# Patient Record
Sex: Male | Born: 1935 | Race: White | Hispanic: No | Marital: Married | Smoking: Never smoker
Health system: Southern US, Community
[De-identification: ages and names within clinical notes are randomized; demographics above are authoritative.]

## PROBLEM LIST (undated history)

## (undated) DIAGNOSIS — E785 Hyperlipidemia, unspecified: Secondary | ICD-10-CM

## (undated) DIAGNOSIS — Z9289 Personal history of other medical treatment: Secondary | ICD-10-CM

## (undated) DIAGNOSIS — H903 Sensorineural hearing loss, bilateral: Secondary | ICD-10-CM

## (undated) DIAGNOSIS — I10 Essential (primary) hypertension: Secondary | ICD-10-CM

## (undated) DIAGNOSIS — I2510 Atherosclerotic heart disease of native coronary artery without angina pectoris: Secondary | ICD-10-CM

## (undated) DIAGNOSIS — F0390 Unspecified dementia without behavioral disturbance: Secondary | ICD-10-CM

## (undated) DIAGNOSIS — H9190 Unspecified hearing loss, unspecified ear: Secondary | ICD-10-CM

## (undated) DIAGNOSIS — I209 Angina pectoris, unspecified: Secondary | ICD-10-CM

## (undated) DIAGNOSIS — M5430 Sciatica, unspecified side: Secondary | ICD-10-CM

## (undated) DIAGNOSIS — I6529 Occlusion and stenosis of unspecified carotid artery: Secondary | ICD-10-CM

## (undated) DIAGNOSIS — I219 Acute myocardial infarction, unspecified: Secondary | ICD-10-CM

## (undated) DIAGNOSIS — G309 Alzheimer's disease, unspecified: Secondary | ICD-10-CM

## (undated) DIAGNOSIS — A6920 Lyme disease, unspecified: Secondary | ICD-10-CM

## (undated) DIAGNOSIS — F0280 Dementia in other diseases classified elsewhere without behavioral disturbance: Secondary | ICD-10-CM

## (undated) DIAGNOSIS — I252 Old myocardial infarction: Secondary | ICD-10-CM

## (undated) DIAGNOSIS — K5792 Diverticulitis of intestine, part unspecified, without perforation or abscess without bleeding: Secondary | ICD-10-CM

## (undated) HISTORY — PX: CORONARY STENT PLACEMENT: SHX1402

## (undated) HISTORY — DX: Lyme disease, unspecified: A69.20

## (undated) HISTORY — DX: Unspecified dementia, unspecified severity, without behavioral disturbance, psychotic disturbance, mood disturbance, and anxiety: F03.90

## (undated) HISTORY — DX: Alzheimer's disease, unspecified: G30.9

## (undated) HISTORY — DX: Diverticulitis of intestine, part unspecified, without perforation or abscess without bleeding: K57.92

## (undated) HISTORY — PX: CHOLECYSTECTOMY: SHX55

## (undated) HISTORY — DX: Old myocardial infarction: I25.2

## (undated) HISTORY — DX: Hyperlipidemia, unspecified: E78.5

## (undated) HISTORY — DX: Personal history of other medical treatment: Z92.89

## (undated) HISTORY — PX: TONSILLECTOMY: SUR1361

## (undated) HISTORY — DX: Unspecified hearing loss, unspecified ear: H91.90

## (undated) HISTORY — PX: HERNIA REPAIR: SHX51

## (undated) HISTORY — DX: Angina pectoris, unspecified: I20.9

## (undated) HISTORY — DX: Dementia in other diseases classified elsewhere, unspecified severity, without behavioral disturbance, psychotic disturbance, mood disturbance, and anxiety: F02.80

## (undated) HISTORY — DX: Occlusion and stenosis of unspecified carotid artery: I65.29

## (undated) HISTORY — PX: APPENDECTOMY: SHX54

---

## 2003-11-25 DIAGNOSIS — I252 Old myocardial infarction: Secondary | ICD-10-CM

## 2003-11-25 HISTORY — DX: Old myocardial infarction: I25.2

## 2014-04-04 ENCOUNTER — Emergency Department (HOSPITAL_COMMUNITY): Payer: Medicare HMO

## 2014-04-04 ENCOUNTER — Encounter (HOSPITAL_COMMUNITY): Payer: Self-pay | Admitting: Emergency Medicine

## 2014-04-04 ENCOUNTER — Emergency Department (HOSPITAL_COMMUNITY)
Admission: EM | Admit: 2014-04-04 | Discharge: 2014-04-04 | Disposition: A | Discharge status: Home / Self Care | Payer: Medicare HMO | Attending: Emergency Medicine | Admitting: Emergency Medicine

## 2014-04-04 DIAGNOSIS — R0789 Other chest pain: Secondary | ICD-10-CM | POA: Insufficient documentation

## 2014-04-04 DIAGNOSIS — Z9861 Coronary angioplasty status: Secondary | ICD-10-CM | POA: Insufficient documentation

## 2014-04-04 DIAGNOSIS — M25519 Pain in unspecified shoulder: Secondary | ICD-10-CM | POA: Insufficient documentation

## 2014-04-04 DIAGNOSIS — Z79899 Other long term (current) drug therapy: Secondary | ICD-10-CM | POA: Insufficient documentation

## 2014-04-04 DIAGNOSIS — I252 Old myocardial infarction: Secondary | ICD-10-CM

## 2014-04-04 DIAGNOSIS — R0602 Shortness of breath: Secondary | ICD-10-CM | POA: Insufficient documentation

## 2014-04-04 HISTORY — DX: Acute myocardial infarction, unspecified: I21.9

## 2014-04-04 LAB — CBC WITH DIFFERENTIAL/PLATELET
BASOS PCT: 0 % (ref 0–1)
Basophils Absolute: 0 10*3/uL (ref 0.0–0.1)
Eosinophils Absolute: 0.1 10*3/uL (ref 0.0–0.7)
Eosinophils Relative: 2 % (ref 0–5)
HCT: 40.4 % (ref 39.0–52.0)
HEMOGLOBIN: 13.8 g/dL (ref 13.0–17.0)
LYMPHS ABS: 1.5 10*3/uL (ref 0.7–4.0)
Lymphocytes Relative: 21 % (ref 12–46)
MCH: 30.7 pg (ref 26.0–34.0)
MCHC: 34.2 g/dL (ref 30.0–36.0)
MCV: 90 fL (ref 78.0–100.0)
MONO ABS: 0.7 10*3/uL (ref 0.1–1.0)
Monocytes Relative: 10 % (ref 3–12)
NEUTROS ABS: 4.9 10*3/uL (ref 1.7–7.7)
NEUTROS PCT: 67 % (ref 43–77)
Platelets: 129 10*3/uL — ABNORMAL LOW (ref 150–400)
RBC: 4.49 MIL/uL (ref 4.22–5.81)
RDW: 13.1 % (ref 11.5–15.5)
WBC: 7.3 10*3/uL (ref 4.0–10.5)

## 2014-04-04 LAB — BASIC METABOLIC PANEL
BUN: 19 mg/dL (ref 6–23)
CO2: 22 mEq/L (ref 19–32)
Calcium: 9.4 mg/dL (ref 8.4–10.5)
Chloride: 103 mEq/L (ref 96–112)
Creatinine, Ser: 0.81 mg/dL (ref 0.50–1.35)
GFR calc Af Amer: >90 mL/min (ref >90)
GFR calc non Af Amer: 79 mL/min — ABNORMAL LOW (ref >90)
Glucose, Bld: 99 mg/dL (ref 70–99)
Potassium: 4.3 mEq/L (ref 3.7–5.3)
Sodium: 137 mEq/L (ref 137–147)

## 2014-04-04 LAB — TROPONIN I: Troponin I: <0.3 ng/mL (ref <0.30)

## 2014-04-04 LAB — PRO B NATRIURETIC PEPTIDE: Pro B Natriuretic peptide (BNP): 218.9 pg/mL (ref 0–450)

## 2014-04-04 NOTE — ED Provider Notes (Signed)
Medical screening examination/treatment/procedure(s) were conducted as a shared visit with resident-physician practitioner(s) and myself.  I personally evaluated the patient during the encounter.  Pt is a 80 y.o. male with pmhx as above presenting with about 15 mins of SOB w/o assoc CP, n/v, diaphoresis, fever, recent illness or injuries.  Pt found to have unremarkable cardiopulm exam and VSS during my exam. No LE pain/edema. Symptoms not simiarl to prior MI. EKG w/ minimal ST depressions seen on prior EKG. Trop not elevated about 6 hrs after episode which is reassuring.  Do not feel sx c/w ACS, though have asked for pt to f/u ASAP for his PCP, discuss outpt stress testing & return to ED for return or new symptoms.    EKG Interpretation  Date/Time:  Tuesday Apr 04 2014 10:40:41 EDT Ventricular Rate:  60 PR Interval:  219 QRS Duration: 97 QT Interval:  444 QTC Calculation: 444 R Axis:   51 Text Interpretation:  Sinus rhythm Ventricular premature complex Borderline prolonged PR interval Minimal ST depression, lateral leads No prior for comparison Confirmed by Jennfer Gassen  MD, Jones Viviani ([id number]) on 04/04/2014 10:48:24 AM        Neta Ehlers, MD 04/04/14 2236

## 2014-04-04 NOTE — ED Provider Notes (Signed)
CSN: [id number]     Arrival date & time 04/04/14  1030 History   First MD Initiated Contact with Patient 04/04/14 1049     Chief Complaint  Patient presents with  . Shortness of Breath     (Consider location/radiation/quality/duration/timing/severity/associated sxs/prior Treatment) HPI  Eldrige Pitkin is a 80 y.o. male PMH MI s/p stent x2 in 2005, recurrent MI 3 years ago that was treated medically, who presents after an episode of SOB.  Patient reports that this morning about 4 hours ago he was lying on his couch taking a nap when he developed sudden onset shortness of breath. He sat up and walked outside to get some fresh air. His symptoms gradually improved over the course of 15-20 minutes. He denies chest pain, shoulder pain, fevers, chills, cough, abdominal pain. He denies a history of leg swelling, weight gain, or PND. Currently he is not experiencing shortness of breath. His wife encouraged him to go to the ED as a precaution.  He tells me his MI's presented as shoulder pain and chest tightness. He did not have any of this this morning.  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 Just yesterday he swam 175 yards without trouble. 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  He recently moved to [place] from [place] because he married a women from here, [place], 3 weeks ago. They're splitting their time between [place] and while they decide where they want to live. He is planning to return to [place] (where his doctors are) at the end of June.  His cardiologist is Dr. Lendon Collar, at [facility] in [place], [place] His PCP is Moise Boring at [facility] in [place], [place] He does not have a local doctor. These practices do not appear to be in [employer].  Past Medical History  Diagnosis Date  . MI (myocardial infarction)    Past Surgical History  Procedure Laterality Date  . Coronary stent  placement     History reviewed. No pertinent family history. History  Substance Use Topics  . Smoking status: Never Smoker   . Smokeless tobacco: Not on file  . Alcohol Use: No    Review of Systems    Allergies  Review of patient's allergies indicates no known allergies.  Home Medications   Prior to Admission medications   Medication Sig Start Date End Date Taking? Authorizing Provider  PRESCRIPTION MEDICATION Take 0.5 tablets by mouth. Heart medication   Yes Historical Provider, MD   BP 150/74  Pulse 55  Temp(Src) 98.5 F (36.9 C) (Oral)  Resp 17  Ht 5\' 8"  (1.727 m)  Wt 157 lb (71.215 kg)  BMI 23.88 kg/m2  SpO2 96% Physical Exam  ED Course  Procedures (including critical care time) Labs Review Labs Reviewed  BASIC METABOLIC PANEL - Abnormal; Notable for the following:    GFR calc non Af Amer 79 (*)    All other components within normal limits  CBC WITH DIFFERENTIAL - Abnormal; Notable for the following:    Platelets 129 (*)    All other components within normal limits  TROPONIN I  PRO B NATRIURETIC PEPTIDE    Imaging Review Dg Chest 2 View  04/04/2014   CLINICAL DATA:  Shortness of breath and weakness  EXAM: CHEST  2 VIEW  COMPARISON:  None.  FINDINGS: The lungs are well-expanded. There is no focal infiltrate. There are coarse lung markings which projects  over the cardiac silhouette on the lateral film and to the left of midline on the frontal film which may reflect subsegmental atelectasis. The cardiopericardial silhouette is top-normal in size. There is tortuosity of the descending thoracic aorta. There is mild degenerative disc space narrowing at multiple thoracic levels.  IMPRESSION: 1. There is no evidence of CHF. 2. Coarse lung markings projecting in the left lower lobe may reflect atelectasis or pneumonia.   Electronically Signed   By: David  Martinique   On: 04/04/2014 12:50     EKG Interpretation   Date/Time:  Tuesday Apr 04 2014 10:40:41 EDT Ventricular  Rate:  60 PR Interval:  219 QRS Duration: 97 QT Interval:  444 QTC Calculation: 444 R Axis:   51 Text Interpretation:  Sinus rhythm Ventricular premature complex  Borderline prolonged PR interval Minimal ST depression, lateral leads No  prior for comparison Confirmed by DOCHERTY  MD, MEGAN ([id number]) on 04/04/2014  10:48:24 AM     Normal sinus rhythm, rate 60, no ST/T wave changes.  MDM   Final diagnoses:  Shortness of breath  History of MI (myocardial infarction)    Haynes Giannotti is a 80 y.o. male PMH MI s/p stent x2 in 2005, recurrent MI 3 years ago that was treated medically, who presents after a transient episode of SOB. It lasted about 15-20 minutes and was not associated with CP. Most important thing to rule out in this patient is ACS. Will obtain troponin, CXR, basic labs, pro BNP.  2:50PM Lab work is unremarkable. Troponin negative. Chest x-ray with no acute abnormality. ProBNP within normal limits. Patient was discharged in stable condition and recommended close PCP and cardiology followup when he returns to [place] next month. He was told to return to the ED or Urgent care if symptoms recur.  Lesly Dukes, MD 04/04/14 1500

## 2014-04-04 NOTE — ED Notes (Addendum)
Called lab to check on blood work results. Lab reports they are running the tests and we should have the results in the next 5-10 minutes.

## 2014-04-04 NOTE — ED Notes (Signed)
Spoke with the lab requesting information on the status of the results of the Troponin. Light green tube was sent down to the lab for BMET and troponin to be run and the BMET has resulted but not the troponin. Lab tech in the lab states that she does not know why the troponin was not run on the 12:00 blood work that was sent down and she reports that more blood work needs to be redrawn because the green top tube in the lab is too old to run the troponin off of. Repeat blood work drawn and sent down for the BNP as well as the troponin. Requested lab run results STAT.

## 2014-04-04 NOTE — ED Notes (Signed)
Pt to department via EMS- pt reports that he went to lay down this morning and became SOB. States that he sat up and took some deep breaths. States that he is now feeling better on EMS arrival. 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 Reports that he recently moved here from [place]. Bp-160/87 Hr-58 20g right hand

## 2014-04-04 NOTE — Discharge Instructions (Signed)
It was a pleasure taking care of you. - Your EKG (heart rhythm strip) and blood work did not suggest heart attack or lung infection. - Please follow up with your PCP and cardiologist in the next few weeks, when you return to [place]. - If you develop recurrent episodes of shortness of breath, chest pain, palpitations please return to the ED or go to our Urgent Care center.

## 2014-11-18 ENCOUNTER — Emergency Department (HOSPITAL_COMMUNITY)
Admission: EM | Admit: 2014-11-18 | Discharge: 2014-11-18 | Disposition: A | Discharge status: Home / Self Care | Payer: Medicare HMO | Attending: Emergency Medicine | Admitting: Emergency Medicine

## 2014-11-18 ENCOUNTER — Emergency Department (HOSPITAL_COMMUNITY): Payer: Medicare HMO

## 2014-11-18 ENCOUNTER — Encounter (HOSPITAL_COMMUNITY): Payer: Self-pay | Admitting: Emergency Medicine

## 2014-11-18 DIAGNOSIS — Z9861 Coronary angioplasty status: Secondary | ICD-10-CM | POA: Insufficient documentation

## 2014-11-18 DIAGNOSIS — Z79899 Other long term (current) drug therapy: Secondary | ICD-10-CM | POA: Diagnosis not present

## 2014-11-18 DIAGNOSIS — M5430 Sciatica, unspecified side: Secondary | ICD-10-CM | POA: Diagnosis present

## 2014-11-18 DIAGNOSIS — I252 Old myocardial infarction: Secondary | ICD-10-CM | POA: Insufficient documentation

## 2014-11-18 DIAGNOSIS — M5441 Lumbago with sciatica, right side: Secondary | ICD-10-CM | POA: Diagnosis not present

## 2014-11-18 DIAGNOSIS — I10 Essential (primary) hypertension: Secondary | ICD-10-CM | POA: Diagnosis not present

## 2014-11-18 DIAGNOSIS — M549 Dorsalgia, unspecified: Secondary | ICD-10-CM

## 2014-11-18 DIAGNOSIS — Z7982 Long term (current) use of aspirin: Secondary | ICD-10-CM | POA: Insufficient documentation

## 2014-11-18 DIAGNOSIS — M79604 Pain in right leg: Secondary | ICD-10-CM

## 2014-11-18 DIAGNOSIS — R109 Unspecified abdominal pain: Secondary | ICD-10-CM | POA: Insufficient documentation

## 2014-11-18 DIAGNOSIS — M5431 Sciatica, right side: Secondary | ICD-10-CM

## 2014-11-18 HISTORY — DX: Essential (primary) hypertension: I10

## 2014-11-18 HISTORY — DX: Sciatica, unspecified side: M54.30

## 2014-11-18 LAB — URINALYSIS, ROUTINE W REFLEX MICROSCOPIC
Bilirubin Urine: NEGATIVE
GLUCOSE, UA: NEGATIVE mg/dL
HGB URINE DIPSTICK: NEGATIVE
Ketones, ur: NEGATIVE mg/dL
LEUKOCYTES UA: NEGATIVE
Nitrite: NEGATIVE
PH: 5.5 (ref 5.0–8.0)
Protein, ur: 30 mg/dL — AB
Specific Gravity, Urine: 1.026 (ref 1.005–1.030)
Urobilinogen, UA: 1 mg/dL (ref 0.0–1.0)

## 2014-11-18 LAB — URINE MICROSCOPIC-ADD ON

## 2014-11-18 MED ORDER — DIAZEPAM 5 MG PO TABS: 5.0000 mg | ORAL_TABLET | Freq: Three times a day (TID) | ORAL | Status: DC | PRN

## 2014-11-18 MED ORDER — PREDNISONE 10 MG PO TABS: ORAL_TABLET | ORAL | Status: DC

## 2014-11-18 MED ORDER — OXYCODONE-ACETAMINOPHEN 5-325 MG PO TABS: 1.0000 | ORAL_TABLET | Freq: Four times a day (QID) | ORAL | Status: DC | PRN

## 2014-11-18 MED ORDER — OXYCODONE-ACETAMINOPHEN 5-325 MG PO TABS
2.0000 | ORAL_TABLET | Freq: Once | ORAL | Status: AC
  Administered 2014-11-18: 2 via ORAL
  Filled 2014-11-18: qty 2

## 2014-11-18 MED ORDER — DIAZEPAM 5 MG PO TABS
5.0000 mg | ORAL_TABLET | Freq: Once | ORAL | Status: AC
  Administered 2014-11-18: 5 mg via ORAL
  Filled 2014-11-18: qty 1

## 2014-11-18 NOTE — ED Provider Notes (Signed)
CSN: [id number]     Arrival date & time 11/18/14  1216 History   First MD Initiated Contact with Patient 11/18/14 1506     Chief Complaint  Patient presents with  . Sciatica     (Consider location/radiation/quality/duration/timing/severity/associated sxs/prior Treatment) The history is provided by the patient, the spouse and medical records. No language interpreter was used.     Isaac [name] is a 80 y.o. male  with a hx of MI, sciatica, HTN presents to the Emergency Department complaining of gradual, persistent, progressively worsening right low back pain onset 1 week ago.  Pt reports the pain is sharp and throbbing, radiating down the right leg, worst from the knee down, rated at a 8/10.  Pt reports seeing his chiropractor without relief on Monday of last week.  Pt reports ibuprofen and xanax which initially made the pain better 2 days ago.  Pt reports the pain returned last light and took more Xanax without relief. Pt reports he has never had pain like this.  Walking or sitting on the right buttock makes the pain worse.  Pt denies fever, chills, headache, neck pain, chest pain, shortness of breath, abdominal pain, nausea, vomiting, diarrhea, weakness, dizziness, syncope, dysuria, hematuria. 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  She reports she has never seen him have pain like this.   Past Medical History  Diagnosis Date  . MI (myocardial infarction)   . Sciatica   . Hypertension    Past Surgical History  Procedure Laterality Date  . Coronary stent placement     No family history on file. History  Substance Use Topics  . Smoking status: Never Smoker   . Smokeless tobacco: Not on file  . Alcohol Use: No    Review of Systems  Constitutional: Negative for fever, diaphoresis, appetite change, fatigue and unexpected weight change.  HENT: Negative for mouth sores.   Eyes: Negative for visual disturbance.   Respiratory: Negative for cough, chest tightness, shortness of breath and wheezing.   Cardiovascular: Negative for chest pain.  Gastrointestinal: Negative for nausea, vomiting, abdominal pain, diarrhea and constipation.  Endocrine: Negative for polydipsia, polyphagia and polyuria.  Genitourinary: Negative for dysuria, urgency, frequency and hematuria.  Musculoskeletal: Positive for back pain and gait problem ( 2/2 pain). Negative for joint swelling, neck pain and neck stiffness.  Skin: Negative for rash.  Allergic/Immunologic: Negative for immunocompromised state.  Neurological: Negative for syncope, weakness, light-headedness, numbness and headaches.  Hematological: Does not bruise/bleed easily.  Psychiatric/Behavioral: Negative for sleep disturbance. The patient is not nervous/anxious.   All other systems reviewed and are negative.     Allergies  Review of patient's allergies indicates no known allergies.  Home Medications   Prior to Admission medications   Medication Sig Start Date End Date Taking? Authorizing Provider  ALPRAZolam (XANAX) 0.25 MG tablet Take 0.25 mg by mouth once.   Yes Historical Provider, MD  aspirin EC 81 MG tablet Take 81 mg by mouth daily.   Yes Historical Provider, MD  B Complex-C (B-COMPLEX WITH VITAMIN C) tablet Take 1 tablet by mouth daily.   Yes Historical Provider, MD  Coenzyme Q10 (CO Q 10 PO) Take 1 tablet by mouth daily.   Yes Historical Provider, MD  donepezil (ARICEPT) 5 MG tablet Take 5 mg by mouth at bedtime.   Yes Historical Provider, MD  famotidine (PEPCID) 20 MG tablet Take 20 mg by mouth daily.   Yes Historical Provider,  MD  Fish Oil-Krill Oil (KRILL OIL PLUS PO) Take 1 capsule by mouth daily.   Yes Historical Provider, MD  glucosamine-chondroitin 500-400 MG tablet Take 1 tablet by mouth daily.   Yes Historical Provider, MD  ibuprofen (ADVIL,MOTRIN) 200 MG tablet Take 400 mg by mouth every 6 (six) hours as needed for moderate pain.   Yes  Historical Provider, MD  lisinopril (PRINIVIL,ZESTRIL) 20 MG tablet Take 20 mg by mouth daily.   Yes Historical Provider, MD  metoprolol succinate (TOPROL-XL) 25 MG 24 hr tablet Take 12.5 mg by mouth daily.   Yes Historical Provider, MD  Multiple Vitamin (MULTIVITAMIN WITH MINERALS) TABS tablet Take 1 tablet by mouth 2 (two) times daily.   Yes Historical Provider, MD  pravastatin (PRAVACHOL) 40 MG tablet Take 40 mg by mouth daily.   Yes Historical Provider, MD  Turmeric 500 MG CAPS Take 500 mg by mouth daily.   Yes Historical Provider, MD  diazepam (VALIUM) 5 MG tablet Take 1 tablet (5 mg total) by mouth every 8 (eight) hours as needed for anxiety (spasms). 11/18/14   Maressa Apollo, PA-C  oxyCODONE-acetaminophen (PERCOCET) 5-325 MG per tablet Take 1-2 tablets by mouth every 6 (six) hours as needed. 11/18/14   Trace Wirick, PA-C  predniSONE (DELTASONE) 10 MG tablet 4 tabs po daily x 3 days, then 3 tabs x 3 days, then 2 tabs x 3 days, then 1 tab x 3 days, then 0.5 tabs x 4 days 11/18/14   Jarrett Soho Adaijah Endres, PA-C   BP 121/79 mmHg  Pulse 84  Temp(Src) 97.6 F (36.4 C) (Oral)  Resp 14  SpO2 94% Physical Exam  Constitutional: He appears well-developed and well-nourished. No distress.  Awake, alert, nontoxic appearance  HENT:  Head: Normocephalic and atraumatic.  Mouth/Throat: Oropharynx is clear and moist. No oropharyngeal exudate.  Eyes: Conjunctivae are normal. No scleral icterus.  Neck: Normal range of motion. Neck supple.  Full ROM without pain  Cardiovascular: Normal rate, regular rhythm, normal heart sounds and intact distal pulses.   Pulmonary/Chest: Effort normal and breath sounds normal. No respiratory distress. He has no wheezes.  Equal chest expansion  Abdominal: Soft. Bowel sounds are normal. He exhibits no distension and no mass. There is tenderness in the suprapubic area. There is no rebound and no guarding.  Very mild suprapubic tenderness  Musculoskeletal:  Normal range of motion. He exhibits no edema.  Full range of motion of the T-spine and L-spine No tenderness to palpation of the spinous processes of the T-spine or L-spine No tenderness to palpation of the paraspinous muscles of the L-spine  Lymphadenopathy:    He has no cervical adenopathy.  Neurological: He is alert. He has normal strength and normal reflexes. No sensory deficit. Coordination and gait normal. GCS eye subscore is 4. GCS verbal subscore is 5. GCS motor subscore is 6.  Reflex Scores:      Bicep reflexes are 2+ on the right side and 2+ on the left side.      Brachioradialis reflexes are 2+ on the right side and 2+ on the left side.      Patellar reflexes are 2+ on the right side and 2+ on the left side.      Achilles reflexes are 2+ on the right side and 2+ on the left side. Speech is clear and goal oriented, follows commands Normal 5/5 strength in upper and lower extremities bilaterally including dorsiflexion and plantar flexion, strong and equal grip strength Sensation normal to light and sharp  touch Moves extremities without ataxia, coordination intact antalgic gait Normal balance No Clonus   Skin: Skin is warm and dry. No rash noted. He is not diaphoretic. No erythema.  Psychiatric: He has a normal mood and affect. His behavior is normal.  Nursing note and vitals reviewed.   ED Course  Procedures (including critical care time) Labs Review Labs Reviewed  URINALYSIS, ROUTINE W REFLEX MICROSCOPIC - Abnormal; Notable for the following:    Protein, ur 30 (*)    All other components within normal limits  URINE MICROSCOPIC-ADD ON    Imaging Review Dg Thoracic Spine 2 View  11/18/2014   CLINICAL DATA:  81 year old male with back pain for 5 days greater on the right. No known injury. Initial encounter.  EXAM: THORACIC SPINE - 2 VIEW  COMPARISON:  Chest radiographs 04/04/2014.  FINDINGS: Normal thoracic segmentation. Bone mineralization is within normal limits for age.  Stable thoracic vertebral height and alignment. Cervicothoracic junction alignment is within normal limits. Lower cervical chronic disc and endplate degeneration. Grossly stable thoracic visceral contours. Posterior ribs largely are overpenetrated on the frontal view.  IMPRESSION: No acute osseous abnormality identified in the thoracic spine.   Electronically Signed   By: Lars Pinks M.D.   On: 11/18/2014 16:17   Dg Lumbar Spine Complete  11/18/2014   CLINICAL DATA:  80 year old male with back pain for 5 days greater on the right. No known injury. Initial encounter.  EXAM: LUMBAR SPINE - COMPLETE 4+ VIEW  COMPARISON:  Thoracic series from the same day reported separately.  FINDINGS: Normal lumbar segmentation. Bone mineralization is within normal limits for age. Chronic appearing anterior endplate changes at Q2-W9, favor degenerative in nature. Multilevel endplate sclerosis and spurring elsewhere. Mild retrolisthesis at L1-L2, L2-L3, and L3-L4. No pars fracture. Mild to moderate lower lumbar facet sclerosis. sacral ala and SI joints within normal limits. Aortoiliac calcified atherosclerosis noted.  IMPRESSION: 1.  No acute osseous abnormality identified in the lumbar spine 2. Multilevel degenerative endplate changes, including anteriorly at L1-L2.   Electronically Signed   By: Lars Pinks M.D.   On: 11/18/2014 16:19     EKG Interpretation None      MDM   Final diagnoses:  Back pain  Sciatica, right  Right leg pain  Right-sided low back pain with right-sided sciatica    Isaac [name] presents with right lower back and right leg pain worse with sitting and walking.  Symptoms consistent with sciatica, but pt also with suprapubic abd tenderness.  Will obtain imaging to r/o compression fractures, UA to r/o UTI and give pain control.  Pt with risk factors for AAA, no pulsatile mass in the abd and pt localizes pain to the leg, doubt AAA or dissection.  Pt with recent travel to and from [place], but no  swelling of the right leg to indicate DVT, neg Homan's sign, no palpable cord or erythema.      4:25PM X-rays evidence of fractures or dislocations. No compression fractures. Urinalysis pending.  5:45 PM Urinalysis evidence of urinary tract infection. Patient reports he continues to have pain. We will attempt a muscle relaxer as well.  I have personally reviewed patient's vitals, nursing note and any pertinent labs or imaging.  I performed an undressed physical exam.    It has been determined that no acute conditions requiring further emergency intervention are present at this time. The patient/guardian have been advised of the diagnosis and plan. I reviewed all labs and imaging including any potential incidental  findings. We have discussed signs and symptoms that warrant return to the ED and they are listed in the discharge instructions.    Vital signs are stable at discharge.   BP 121/79 mmHg  Pulse 84  Temp(Src) 97.6 F (36.4 C) (Oral)  Resp 14  SpO2 94%    Abigail Butts, PA-C 11/19/14 0100  Virgel Manifold, MD 11/20/14 1452

## 2014-11-18 NOTE — ED Notes (Signed)
Pt c/o right sciatica pain that started last weekend. Pt states that he saw his chiropractor on Monday due to the pain.  Pt states that he hasnt been able to sleep due to the pain even with taking pain meds.

## 2014-11-18 NOTE — Discharge Instructions (Signed)
1. Medications: Percocet, Valium, prednisone, usual home medications 2. Treatment: rest, drink plenty of fluids, gentle stretching as discussed, alternate ice and heat 3. Follow Up: Please followup with your primary doctor in 3 days for discussion of your diagnoses and further evaluation after today's visit; if you do not have a primary care doctor use the resource guide provided to find one;  Return to the ER for worsening back pain, difficulty walking, loss of bowel or bladder control or other concerning symptoms   Back Exercises Back exercises help treat and prevent back injuries. The goal of back exercises is to increase the strength of your abdominal and back muscles and the flexibility of your back. These exercises should be started when you no longer have back pain. Back exercises include:  Pelvic Tilt. Lie on your back with your knees bent. Tilt your pelvis until the lower part of your back is against the floor. Hold this position 5 to 10 sec and repeat 5 to 10 times.  Knee to Chest. Pull first 1 knee up against your chest and hold for 20 to 30 seconds, repeat this with the other knee, and then both knees. This may be done with the other leg straight or bent, whichever feels better.  Sit-Ups or Curl-Ups. Bend your knees 90 degrees. Start with tilting your pelvis, and do a partial, slow sit-up, lifting your trunk only 30 to 45 degrees off the floor. Take at least 2 to 3 seconds for each sit-up. Do not do sit-ups with your knees out straight. If partial sit-ups are difficult, simply do the above but with only tightening your abdominal muscles and holding it as directed.  Hip-Lift. Lie on your back with your knees flexed 90 degrees. Push down with your feet and shoulders as you raise your hips a couple inches off the floor; hold for 10 seconds, repeat 5 to 10 times.  Back arches. Lie on your stomach, propping yourself up on bent elbows. Slowly press on your hands, causing an arch in your low  back. Repeat 3 to 5 times. Any initial stiffness and discomfort should lessen with repetition over time.  Shoulder-Lifts. Lie face down with arms beside your body. Keep hips and torso pressed to floor as you slowly lift your head and shoulders off the floor. Do not overdo your exercises, especially in the beginning. Exercises may cause you some mild back discomfort which lasts for a few minutes; however, if the pain is more severe, or lasts for more than 15 minutes, do not continue exercises until you see your caregiver. Improvement with exercise therapy for back problems is slow.  See your caregivers for assistance with developing a proper back exercise program. Document Released: 12/18/2004 Document Revised: 02/02/2012 Document Reviewed: 09/11/2011 [facility] Patient Information 2015 [place], [place]. This information is not intended to replace advice given to you by your health care provider. Make sure you discuss any questions you have with your health care provider.

## 2014-11-27 DIAGNOSIS — M9903 Segmental and somatic dysfunction of lumbar region: Secondary | ICD-10-CM | POA: Diagnosis not present

## 2014-11-27 DIAGNOSIS — M5116 Intervertebral disc disorders with radiculopathy, lumbar region: Secondary | ICD-10-CM | POA: Diagnosis not present

## 2014-11-27 DIAGNOSIS — M5137 Other intervertebral disc degeneration, lumbosacral region: Secondary | ICD-10-CM | POA: Diagnosis not present

## 2014-12-04 DIAGNOSIS — H9190 Unspecified hearing loss, unspecified ear: Secondary | ICD-10-CM | POA: Diagnosis not present

## 2014-12-04 DIAGNOSIS — G309 Alzheimer's disease, unspecified: Secondary | ICD-10-CM | POA: Diagnosis not present

## 2014-12-04 DIAGNOSIS — I10 Essential (primary) hypertension: Secondary | ICD-10-CM | POA: Diagnosis not present

## 2014-12-05 DIAGNOSIS — M9903 Segmental and somatic dysfunction of lumbar region: Secondary | ICD-10-CM | POA: Diagnosis not present

## 2014-12-05 DIAGNOSIS — M5116 Intervertebral disc disorders with radiculopathy, lumbar region: Secondary | ICD-10-CM | POA: Diagnosis not present

## 2014-12-05 DIAGNOSIS — M5137 Other intervertebral disc degeneration, lumbosacral region: Secondary | ICD-10-CM | POA: Diagnosis not present

## 2014-12-07 DIAGNOSIS — M5116 Intervertebral disc disorders with radiculopathy, lumbar region: Secondary | ICD-10-CM | POA: Diagnosis not present

## 2014-12-07 DIAGNOSIS — M9903 Segmental and somatic dysfunction of lumbar region: Secondary | ICD-10-CM | POA: Diagnosis not present

## 2014-12-07 DIAGNOSIS — M5137 Other intervertebral disc degeneration, lumbosacral region: Secondary | ICD-10-CM | POA: Diagnosis not present

## 2014-12-08 DIAGNOSIS — Z974 Presence of external hearing-aid: Secondary | ICD-10-CM | POA: Diagnosis not present

## 2014-12-08 DIAGNOSIS — H903 Sensorineural hearing loss, bilateral: Secondary | ICD-10-CM | POA: Diagnosis not present

## 2014-12-19 DIAGNOSIS — I2510 Atherosclerotic heart disease of native coronary artery without angina pectoris: Secondary | ICD-10-CM | POA: Diagnosis not present

## 2014-12-19 DIAGNOSIS — Z9861 Coronary angioplasty status: Secondary | ICD-10-CM | POA: Diagnosis not present

## 2015-07-05 DIAGNOSIS — B359 Dermatophytosis, unspecified: Secondary | ICD-10-CM | POA: Diagnosis not present

## 2015-07-05 DIAGNOSIS — L821 Other seborrheic keratosis: Secondary | ICD-10-CM | POA: Diagnosis not present

## 2015-07-05 DIAGNOSIS — L570 Actinic keratosis: Secondary | ICD-10-CM | POA: Diagnosis not present

## 2015-07-05 DIAGNOSIS — D239 Other benign neoplasm of skin, unspecified: Secondary | ICD-10-CM | POA: Diagnosis not present

## 2015-07-06 ENCOUNTER — Ambulatory Visit (INDEPENDENT_AMBULATORY_CARE_PROVIDER_SITE_OTHER): Payer: Medicare Other | Admitting: Cardiology

## 2015-07-06 ENCOUNTER — Encounter: Payer: Self-pay | Admitting: Cardiology

## 2015-07-06 VITALS — BP 152/72 | HR 59 | Ht 69.0 in | Wt 162.0 lb

## 2015-07-06 DIAGNOSIS — I252 Old myocardial infarction: Secondary | ICD-10-CM

## 2015-07-06 DIAGNOSIS — I2583 Coronary atherosclerosis due to lipid rich plaque: Principal | ICD-10-CM

## 2015-07-06 DIAGNOSIS — I10 Essential (primary) hypertension: Secondary | ICD-10-CM | POA: Diagnosis not present

## 2015-07-06 DIAGNOSIS — I2510 Atherosclerotic heart disease of native coronary artery without angina pectoris: Secondary | ICD-10-CM

## 2015-07-06 NOTE — Progress Notes (Signed)
Cardiology Office Note   Date:  07/06/2015   ID:  Isaac [name], DOB 1926/04/05, MRN [id number]  PCP:  Melinda Crutch, MD  Cardiologist:   Candee Furbish, MD   No chief complaint on file.     History of Present Illness: Isaac [name] is a 81 y.o. male who presents to establish care. Has history of MI, essential hypertension. MI in 2005, woke up early and felt pain in shoulder and chest pain. Went to hospital in [facility]. Placed 2 stents (90% and 60% stenosis). Then had another light MI, no PCI (had another cath 2010).  Since then, feels well. Competes in swimming. 31st year of it.   Enjoys [facility]. Isaac [name] his wife is a patient of mine.   He is not having any anginal symptoms, no chest pain, no shortness of breath, no fevers, chills, orthopnea, PND, syncope. Occasional equilibrium issues.    Past Medical History  Diagnosis Date  . MI (myocardial infarction)   . Sciatica   . Hypertension     Past Surgical History  Procedure Laterality Date  . Coronary stent placement       Current Outpatient Prescriptions  Medication Sig Dispense Refill  . ALPRAZolam (XANAX) 0.25 MG tablet Take 0.25 mg by mouth once.    Marland Kitchen aspirin EC 81 MG tablet Take 81 mg by mouth daily.    . Coenzyme Q10 (CO Q 10 PO) Take 1 tablet by mouth daily.    Marland Kitchen donepezil (ARICEPT) 5 MG tablet Take 5 mg by mouth at bedtime.    . Fish Oil-Krill Oil (KRILL OIL PLUS PO) Take 1 capsule by mouth daily.    Marland Kitchen glucosamine-chondroitin 500-400 MG tablet Take 1 tablet by mouth daily.    Marland Kitchen lisinopril (PRINIVIL,ZESTRIL) 20 MG tablet Take 20 mg by mouth daily.    . metoprolol succinate (TOPROL-XL) 25 MG 24 hr tablet Take 12.5 mg by mouth daily.    . Multiple Vitamin (MULTIVITAMIN WITH MINERALS) TABS tablet Take 1 tablet by mouth 2 (two) times daily.    . Turmeric 500 MG CAPS Take 500 mg by mouth daily.     No current facility-administered medications for this visit.    Allergies:   Review of patient's allergies  indicates no known allergies.    Social History:  The patient  reports that he has never smoked. He does not have any smokeless tobacco history on file. He reports that he does not drink alcohol or use illicit drugs.  Moved into [facility]. Remarried two years ago. Isaac [name] is wife. 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   Family History:  The patient's father had CAD/ angina. Died at 29.     ROS:  Please see the history of present illness.   Otherwise, review of systems are positive for none. Balance issues.    All other systems are reviewed and negative.    PHYSICAL EXAM: VS:  BP 152/72 mmHg  Pulse 59  Ht 5\' 9"  (1.753 m)  Wt 162 lb (73.483 kg)  BMI 23.91 kg/m2 , BMI Body mass index is 23.91 kg/(m^2). GEN: Well nourished, well developed, in no acute distress HEENT: normal Neck: no JVD, carotid bruits, or masses Cardiac: RRR; no murmurs, rubs, or gallops,no edema  Respiratory:  clear to auscultation bilaterally, normal work of breathing GI: soft, nontender, nondistended, + BS MS: no deformity or atrophy Skin: warm and dry, no rash Neuro:  Strength and sensation are intact Psych: euthymic mood, full affect  EKG:  EKG is ordered today. The ekg ordered today demonstrates 07/06/15-sinus bradycardia first AV block, heart rate 59, Q waves noted in 3 and small Q waves in aVF.   Recent Labs: No results found for requested labs within last 365 days.    Lipid Panel No results found for: CHOL, TRIG, HDL, CHOLHDL, VLDL, LDLCALC, LDLDIRECT    Wt Readings from Last 3 Encounters:  07/06/15 162 lb (73.483 kg)  04/04/14 157 lb (71.215 kg)      Other studies Reviewed: Additional studies/ records that were reviewed today include: Prior ER visit from 11/18/14. No abnormalities on x-ray of thoracic spine. Review of the above records demonstrates: as above. Records from Dr. Harrington Challenger office pending.   ASSESSMENT AND PLAN:  1.  Coronary artery disease  - Doing very well. No  anginal symptoms.  - Continue aspirin, beta blocker, statin  - 2 prior stents placed in 2005 in the setting of MI. [place]. [facility]  - EKG with old inferior Q waves noted.  - He was told on second catheterization in 2010 in the setting of mild MI that he had good collateral blood flow, no PCI.  2. Hyperlipidemia  - Continue with pravastatin  - LDL goal less than 70.  - We will obtain records from Dr. Harrington Challenger office  3. Essential hypertension  - Currently well controlled, medications reviewed  4. Prior myocardial infarction/noted on EKG     Current medicines are reviewed at length with the patient today.  The patient does not have concerns regarding medicines.  The following changes have been made:  no change  Labs/ tests ordered today include: none  No orders of the defined types were placed in this encounter.     Disposition:   FU with Skains in 6 months  Signed, Candee Furbish, MD  07/06/2015 8:11 AM    [facility] [address], [place], [place]  [address] Phone: [phone]; Fax: [phone]

## 2015-07-06 NOTE — Patient Instructions (Signed)
Medication Instructions:  Your physician recommends that you continue on your current medications as directed. Please refer to the Current Medication list given to you today.  Follow-Up: Follow up in 6 months with Dr. Skains.  You will receive a letter in the mail 2 months before you are due.  Please call us when you receive this letter to schedule your follow up appointment.  Thank you for choosing [facility]!!     

## 2015-07-25 DIAGNOSIS — H9190 Unspecified hearing loss, unspecified ear: Secondary | ICD-10-CM | POA: Diagnosis not present

## 2015-07-25 DIAGNOSIS — H6120 Impacted cerumen, unspecified ear: Secondary | ICD-10-CM | POA: Diagnosis not present

## 2015-08-01 ENCOUNTER — Inpatient Hospital Stay (HOSPITAL_COMMUNITY)
Admission: EM | Admit: 2015-08-01 | Discharge: 2015-08-11 | DRG: 234 | Disposition: A | Discharge status: Skilled Nursing Facility | Payer: Medicare Other | Attending: Cardiothoracic Surgery | Admitting: Cardiothoracic Surgery

## 2015-08-01 ENCOUNTER — Emergency Department (HOSPITAL_COMMUNITY): Payer: Medicare Other

## 2015-08-01 ENCOUNTER — Encounter (HOSPITAL_COMMUNITY): Payer: Self-pay | Admitting: Emergency Medicine

## 2015-08-01 DIAGNOSIS — D62 Acute posthemorrhagic anemia: Secondary | ICD-10-CM | POA: Diagnosis not present

## 2015-08-01 DIAGNOSIS — Z7982 Long term (current) use of aspirin: Secondary | ICD-10-CM

## 2015-08-01 DIAGNOSIS — R778 Other specified abnormalities of plasma proteins: Secondary | ICD-10-CM | POA: Diagnosis present

## 2015-08-01 DIAGNOSIS — D696 Thrombocytopenia, unspecified: Secondary | ICD-10-CM | POA: Diagnosis not present

## 2015-08-01 DIAGNOSIS — I10 Essential (primary) hypertension: Secondary | ICD-10-CM | POA: Diagnosis not present

## 2015-08-01 DIAGNOSIS — R7989 Other specified abnormal findings of blood chemistry: Secondary | ICD-10-CM | POA: Diagnosis present

## 2015-08-01 DIAGNOSIS — I440 Atrioventricular block, first degree: Secondary | ICD-10-CM | POA: Diagnosis not present

## 2015-08-01 DIAGNOSIS — Z951 Presence of aortocoronary bypass graft: Secondary | ICD-10-CM

## 2015-08-01 DIAGNOSIS — I25110 Atherosclerotic heart disease of native coronary artery with unstable angina pectoris: Secondary | ICD-10-CM | POA: Diagnosis not present

## 2015-08-01 DIAGNOSIS — Z23 Encounter for immunization: Secondary | ICD-10-CM

## 2015-08-01 DIAGNOSIS — I2510 Atherosclerotic heart disease of native coronary artery without angina pectoris: Secondary | ICD-10-CM | POA: Diagnosis not present

## 2015-08-01 DIAGNOSIS — E785 Hyperlipidemia, unspecified: Secondary | ICD-10-CM | POA: Diagnosis present

## 2015-08-01 DIAGNOSIS — I252 Old myocardial infarction: Secondary | ICD-10-CM

## 2015-08-01 DIAGNOSIS — R070 Pain in throat: Secondary | ICD-10-CM

## 2015-08-01 DIAGNOSIS — R001 Bradycardia, unspecified: Secondary | ICD-10-CM | POA: Diagnosis not present

## 2015-08-01 DIAGNOSIS — Z955 Presence of coronary angioplasty implant and graft: Secondary | ICD-10-CM

## 2015-08-01 DIAGNOSIS — E8770 Fluid overload, unspecified: Secondary | ICD-10-CM | POA: Diagnosis not present

## 2015-08-01 DIAGNOSIS — M542 Cervicalgia: Secondary | ICD-10-CM

## 2015-08-01 DIAGNOSIS — I2583 Coronary atherosclerosis due to lipid rich plaque: Secondary | ICD-10-CM | POA: Diagnosis present

## 2015-08-01 DIAGNOSIS — I214 Non-ST elevation (NSTEMI) myocardial infarction: Secondary | ICD-10-CM | POA: Diagnosis not present

## 2015-08-01 DIAGNOSIS — R7309 Other abnormal glucose: Secondary | ICD-10-CM | POA: Diagnosis present

## 2015-08-01 DIAGNOSIS — R079 Chest pain, unspecified: Secondary | ICD-10-CM | POA: Diagnosis not present

## 2015-08-01 DIAGNOSIS — I160 Hypertensive urgency: Secondary | ICD-10-CM

## 2015-08-01 DIAGNOSIS — R030 Elevated blood-pressure reading, without diagnosis of hypertension: Secondary | ICD-10-CM | POA: Diagnosis not present

## 2015-08-01 DIAGNOSIS — Z9689 Presence of other specified functional implants: Secondary | ICD-10-CM

## 2015-08-01 HISTORY — DX: Atherosclerotic heart disease of native coronary artery without angina pectoris: I25.10

## 2015-08-01 LAB — CBC
HCT: 41.9 % (ref 39.0–52.0)
Hemoglobin: 14.4 g/dL (ref 13.0–17.0)
MCH: 31 pg (ref 26.0–34.0)
MCHC: 34.4 g/dL (ref 30.0–36.0)
MCV: 90.1 fL (ref 78.0–100.0)
PLATELETS: 187 10*3/uL (ref 150–400)
RBC: 4.65 MIL/uL (ref 4.22–5.81)
RDW: 13.5 % (ref 11.5–15.5)
WBC: 10.7 10*3/uL — AB (ref 4.0–10.5)

## 2015-08-01 LAB — BASIC METABOLIC PANEL
Anion gap: 6 (ref 5–15)
BUN: 15 mg/dL (ref 6–20)
CO2: 26 mmol/L (ref 22–32)
CREATININE: 0.99 mg/dL (ref 0.61–1.24)
Calcium: 9.3 mg/dL (ref 8.9–10.3)
Chloride: 104 mmol/L (ref 101–111)
GFR calc non Af Amer: >60 mL/min (ref >60)
Glucose, Bld: 114 mg/dL — ABNORMAL HIGH (ref 65–99)
Potassium: 3.9 mmol/L (ref 3.5–5.1)
SODIUM: 136 mmol/L (ref 135–145)

## 2015-08-01 LAB — TROPONIN I
Troponin I: 0.87 ng/mL (ref <0.031)
Troponin I: 1.33 ng/mL (ref <0.031)

## 2015-08-01 LAB — I-STAT TROPONIN, ED
TROPONIN I, POC: 0.32 ng/mL — AB (ref 0.00–0.08)
Troponin i, poc: 0.04 ng/mL (ref 0.00–0.08)

## 2015-08-01 LAB — TSH: TSH: 2.646 u[IU]/mL (ref 0.350–4.500)

## 2015-08-01 MED ORDER — METOPROLOL SUCCINATE ER 25 MG PO TB24: 12.5000 mg | ORAL_TABLET | Freq: Every day | ORAL | Status: DC

## 2015-08-01 MED ORDER — NITROGLYCERIN 0.4 MG SL SUBL: 0.4000 mg | SUBLINGUAL_TABLET | SUBLINGUAL | Status: DC | PRN

## 2015-08-01 MED ORDER — ASPIRIN EC 81 MG PO TBEC
81.0000 mg | DELAYED_RELEASE_TABLET | Freq: Every day | ORAL | Status: DC
  Administered 2015-08-02 – 2015-08-06 (×5): 81 mg via ORAL
  Filled 2015-08-01 (×5): qty 1

## 2015-08-01 MED ORDER — HEPARIN BOLUS VIA INFUSION
3500.0000 [IU] | Freq: Once | INTRAVENOUS | Status: AC
  Administered 2015-08-01: 3500 [IU] via INTRAVENOUS

## 2015-08-01 MED ORDER — ACETAMINOPHEN 325 MG PO TABS: 650.0000 mg | ORAL_TABLET | ORAL | Status: DC | PRN

## 2015-08-01 MED ORDER — PRAVASTATIN SODIUM 40 MG PO TABS
40.0000 mg | ORAL_TABLET | Freq: Every evening | ORAL | Status: DC
  Administered 2015-08-01 – 2015-08-10 (×9): 40 mg via ORAL
  Filled 2015-08-01 (×10): qty 1

## 2015-08-01 MED ORDER — ASPIRIN 81 MG PO CHEW
324.0000 mg | CHEWABLE_TABLET | Freq: Once | ORAL | Status: AC
  Filled 2015-08-01: qty 4

## 2015-08-01 MED ORDER — HEPARIN (PORCINE) IN NACL 100-0.45 UNIT/ML-% IJ SOLN
1150.0000 [IU]/h | INTRAMUSCULAR | Status: DC
  Administered 2015-08-01: 850 [IU]/h via INTRAVENOUS
  Filled 2015-08-01 (×3): qty 250

## 2015-08-01 MED ORDER — ONDANSETRON HCL 4 MG/2ML IJ SOLN: 4.0000 mg | Freq: Four times a day (QID) | INTRAMUSCULAR | Status: DC | PRN

## 2015-08-01 MED ORDER — DONEPEZIL HCL 5 MG PO TABS
5.0000 mg | ORAL_TABLET | Freq: Every day | ORAL | Status: DC
  Administered 2015-08-01 – 2015-08-10 (×10): 5 mg via ORAL
  Filled 2015-08-01 (×10): qty 1

## 2015-08-01 NOTE — Progress Notes (Signed)
Critical troponin lab value 0.87. Claiborne Billings, MD notified and aware. Patient is asymptomatic at the moment. Will continue to monitor.

## 2015-08-01 NOTE — ED Notes (Signed)
Report attempted 

## 2015-08-01 NOTE — ED Notes (Signed)
Went swimming this am, 2 hours later patient felt he had a discomfort in his throat and states it felt like a burning or chocking sensation.  Has MI and coronary stent hx (2005)

## 2015-08-01 NOTE — H&P (Signed)
Cardiologist: Isaac [name] is an 81 y.o. male.   Chief Complaint: Throat pain HPI:  Isaac [name] is a 81 y.o. male with a history of MI, essential hypertension. MI in 2005, woke up early and felt pain in shoulder and chest pain. Went to hospital in [facility]. Placed 2 stents (90% and 60% stenosis). Then had another light MI, no PCI (had another cath 2010).  Patient presents today with pain in the side of his neck. Patient states that he was he swims 2 times a week approximately 2-300 yards each time. Today about an hour after getting out of the pool his throat/neck started to hurt. His wife called EMS blood pressure was 202/100. EMS gave him 4 baby aspirin.  The patient currently denies nausea, vomiting, fever, chest pain, shortness of breath, orthopnea, dizziness, PND, cough, congestion, abdominal pain, hematochezia, melena, lower extremity edema.  Medications: Medication Sig  aspirin EC 81 MG tablet Take 81 mg by mouth daily.  donepezil (ARICEPT) 5 MG tablet Take 5 mg by mouth at bedtime.  Fish Oil-Krill Oil (KRILL OIL PLUS PO) Take 1 capsule by mouth daily.  glucosamine-chondroitin 500-400 MG tablet Take 1 tablet by mouth daily.  lisinopril (PRINIVIL,ZESTRIL) 20 MG tablet Take 20 mg by mouth daily.  metoprolol succinate (TOPROL-XL) 25 MG 24 hr tablet Take 12.5 mg by mouth daily.  Multiple Vitamin (MULTIVITAMIN WITH MINERALS) TABS tablet Take 1 tablet by mouth 2 (two) times daily.  pravastatin (PRAVACHOL) 40 MG tablet Take 40 mg by mouth every evening.  Probiotic Product (PROBIOTIC PO) Take 1 tablet by mouth daily.  Turmeric 500 MG CAPS Take 500 mg by mouth daily.     Past Medical History  Diagnosis Date  . MI (myocardial infarction)   . Sciatica   . Hypertension     Past Surgical History  Procedure Laterality Date  . Coronary stent placement      No family history on file. Social History:  reports that he has never smoked. He does not have any smokeless  tobacco history on file. He reports that he does not drink alcohol or use illicit drugs.  Allergies: No Known Allergies   (Not in a hospital admission)  Results for orders placed or performed during the hospital encounter of 08/01/15 (from the past 48 hour(s))  CBC     Status: Abnormal   Collection Time: 08/01/15  1:39 PM  Result Value Ref Range   WBC 10.7 (H) 4.0 - 10.5 K/uL   RBC 4.65 4.22 - 5.81 MIL/uL   Hemoglobin 14.4 13.0 - 17.0 g/dL   HCT 41.9 39.0 - 52.0 %   MCV 90.1 78.0 - 100.0 fL   MCH 31.0 26.0 - 34.0 pg   MCHC 34.4 30.0 - 36.0 g/dL   RDW 13.5 11.5 - 15.5 %   Platelets 187 150 - 400 K/uL  Basic metabolic panel     Status: Abnormal   Collection Time: 08/01/15  1:39 PM  Result Value Ref Range   Sodium 136 135 - 145 mmol/L   Potassium 3.9 3.5 - 5.1 mmol/L   Chloride 104 101 - 111 mmol/L   CO2 26 22 - 32 mmol/L   Glucose, Bld 114 (H) 65 - 99 mg/dL   BUN 15 6 - 20 mg/dL   Creatinine, Ser 0.99 0.61 - 1.24 mg/dL   Calcium 9.3 8.9 - 10.3 mg/dL   GFR calc non Af Amer >60 >60 mL/min   GFR calc Af Amer >60 >60  mL/min    Comment: (NOTE) The eGFR has been calculated using the CKD EPI equation. This calculation has not been validated in all clinical situations. eGFR's persistently <60 mL/min signify possible Chronic Kidney Disease.    Anion gap 6 5 - 15  I-stat troponin, ED     Status: None   Collection Time: 08/01/15  1:43 PM  Result Value Ref Range   Troponin i, poc 0.04 0.00 - 0.08 ng/mL   Comment 3            Comment: Due to the release kinetics of cTnI, a negative result within the first hours of the onset of symptoms does not rule out myocardial infarction with certainty. If myocardial infarction is still suspected, repeat the test at appropriate intervals.   I-stat troponin, ED     Status: Abnormal   Collection Time: 08/01/15  4:33 PM  Result Value Ref Range   Troponin i, poc 0.32 (HH) 0.00 - 0.08 ng/mL   Comment NOTIFIED PHYSICIAN    Comment 3             Comment: Due to the release kinetics of cTnI, a negative result within the first hours of the onset of symptoms does not rule out myocardial infarction with certainty. If myocardial infarction is still suspected, repeat the test at appropriate intervals.    Dg Chest 2 View  08/01/2015   CLINICAL DATA:  Chest pain, question aspiration while swimming today.  EXAM: CHEST  2 VIEW  COMPARISON:  Apr 04, 2014  FINDINGS: The heart size and mediastinal contours are stable. The heart size enlarged. There is coarse mild increased pulmonary interstitium in the left lower lobe unchanged compared to prior exam. There is no focal pneumonia, pulmonary edema, or pleural effusion. The visualized skeletal structures are stable.  IMPRESSION: No active cardiopulmonary disease. Chronic changes of the left lower lobe.   Electronically Signed   By: Abelardo Diesel M.D.   On: 08/01/2015 14:02    Review of Systems  Constitutional: Negative for fever and diaphoresis.  HENT: Negative for congestion.   Respiratory: Negative for cough and shortness of breath.   Cardiovascular: Negative for chest pain, orthopnea, leg swelling and PND.  Gastrointestinal: Negative for nausea, vomiting, abdominal pain, blood in stool and melena.  Genitourinary: Negative for hematuria.  Musculoskeletal: Positive for neck pain. Negative for myalgias.  Neurological: Negative for dizziness and weakness.  All other systems reviewed and are negative.   Blood pressure 155/65, pulse 62, temperature 98.5 F (36.9 C), temperature source Oral, resp. rate 20, weight 162 lb 0.6 oz (73.5 kg), SpO2 99 %. Physical Exam  Nursing note and vitals reviewed. Constitutional: He is oriented to person, place, and time. He appears well-developed and well-nourished. No distress.  HENT:  Head: Normocephalic and atraumatic.  Mouth/Throat: No oropharyngeal exudate.  Eyes: EOM are normal. Pupils are equal, round, and reactive to light. No scleral icterus.  Neck:  Normal range of motion. Neck supple. No JVD present.  Cardiovascular: Normal rate, regular rhythm, S1 normal and S2 normal.  Exam reveals no gallop.   No murmur heard. Pulses:      Radial pulses are 2+ on the right side, and 2+ on the left side.       Dorsalis pedis pulses are 2+ on the right side, and 2+ on the left side.  Respiratory: Effort normal and breath sounds normal. He has no wheezes. He has no rales.  GI: Soft. Bowel sounds are normal. He exhibits no  distension. There is no tenderness.  Musculoskeletal: He exhibits no edema.  Lymphadenopathy:    He has no cervical adenopathy.  Neurological: He is alert and oriented to person, place, and time. He exhibits normal muscle tone.  Skin: Skin is warm and dry.  Psychiatric: He has a normal mood and affect.     Assessment/Plan Principal Problem:   Essential hypertension  Continue metoprolol and will increase lisinopril to 40 mg. Continue to monitor blood pressure   Elevated troponin  Likely related to hypertensive urgency.  No complaints of angina.  No acute EKG changes.  Continue to cycle every 6 hours.   Coronary artery disease due to lipid rich plaque   Old MI (myocardial infarction)   Throat pain  Likely discharge tomorrow  Tarri Fuller, [facility] 08/01/2015, 6:32 PM

## 2015-08-01 NOTE — Consult Note (Signed)
PHARMACY CONSULT NOTE  Pharmacy Consult :  Heparin Indication : Rule Out ACS  Allergies: No Known Allergies  Heparin Dosing weight : 73.5 kg  Vital Signs: BP 167/68 mmHg  Pulse 53  Temp(Src) 98.5 F (36.9 C) (Oral)  Resp 10  Wt 162 lb 0.6 oz (73.5 kg)  SpO2 98%  Active Problems: Active Problems:   * No active hospital problems. *  Labs:  Recent Labs  08/01/15 1339  HGB 14.4  HCT 41.9  PLT 187  CREATININE 0.99   No results found for: INR, PROTIME Estimated Creatinine Clearance: 52.6 mL/min (by C-G formula based on Cr of 0.99).  Medical / Surgical History: Past Medical History  Diagnosis Date  . MI (myocardial infarction)   . Sciatica   . Hypertension    Past Surgical History  Procedure Laterality Date  . Coronary stent placement      MEDICATION: Medication PTA: pending  Scheduled:  Scheduled:  . heparin  3,500 Units Intravenous Once   Infusion[s]: Infusions:  . heparin     Antibiotic[s]: Anti-infectives    None      ASSESSMENT:  81 y.o. male admitted to rule out ACS.  Heparin infusion to be started per Pharmacy Consult.  Initial Enzymes negative.  No current chest pain.  GOAL: Heparin Level  0.3 - 0.7 units/ml   PLAN: 1. Heparin bolus 3500 units IV now, then  continue Heparin infusion at 850 units/hr.  The next Heparin Level in 8 hours  2. Daily Heparin level, CBC while on Heparin.  Monitor for bleeding complications. Follow Platelet counts.  Marthenia Rolling,  Pharm.D   08/01/2015,  5:36 PM. 08/01/2015,  5:36 PM

## 2015-08-01 NOTE — ED Provider Notes (Addendum)
CSN: [id number]     Arrival date & time 08/01/15  1242 History   First MD Initiated Contact with Patient 08/01/15 1247     Chief Complaint  Patient presents with  . Chest Pain  . Aspiration    ?     (Consider location/radiation/quality/duration/timing/severity/associated sxs/prior Treatment) Patient is a 81 y.o. male presenting with chest pain. The history is provided by the patient.  Chest Pain Chest pain location: in throat. Pain quality: burning   Pain radiates to:  Does not radiate Pain radiates to the back: no   Pain severity:  Mild Onset quality:  Gradual Duration:  2 hours Timing:  Constant Progression:  Resolved Chronicity:  New Context: at rest   Relieved by:  Nothing Worsened by:  Nothing tried Associated symptoms: no abdominal pain, no cough, no fever, no shortness of breath and not vomiting     Past Medical History  Diagnosis Date  . MI (myocardial infarction)   . Sciatica   . Hypertension    Past Surgical History  Procedure Laterality Date  . Coronary stent placement     No family history on file. Social History  Substance Use Topics  . Smoking status: Never Smoker   . Smokeless tobacco: None  . Alcohol Use: No    Review of Systems  Constitutional: Negative for fever.  Respiratory: Negative for cough and shortness of breath.   Cardiovascular: Positive for chest pain.  Gastrointestinal: Negative for vomiting and abdominal pain.  All other systems reviewed and are negative.     Allergies  Review of patient's allergies indicates no known allergies.  Home Medications   Prior to Admission medications   Medication Sig Start Date End Date Taking? Authorizing Provider  ALPRAZolam (XANAX) 0.25 MG tablet Take 0.25 mg by mouth once.    Historical Provider, MD  aspirin EC 81 MG tablet Take 81 mg by mouth daily.    Historical Provider, MD  Coenzyme Q10 (CO Q 10 PO) Take 1 tablet by mouth daily.    Historical Provider, MD  donepezil (ARICEPT) 5 MG  tablet Take 5 mg by mouth at bedtime.    Historical Provider, MD  Fish Oil-Krill Oil (KRILL OIL PLUS PO) Take 1 capsule by mouth daily.    Historical Provider, MD  glucosamine-chondroitin 500-400 MG tablet Take 1 tablet by mouth daily.    Historical Provider, MD  lisinopril (PRINIVIL,ZESTRIL) 20 MG tablet Take 20 mg by mouth daily.    Historical Provider, MD  metoprolol succinate (TOPROL-XL) 25 MG 24 hr tablet Take 12.5 mg by mouth daily.    Historical Provider, MD  Multiple Vitamin (MULTIVITAMIN WITH MINERALS) TABS tablet Take 1 tablet by mouth 2 (two) times daily.    Historical Provider, MD  Turmeric 500 MG CAPS Take 500 mg by mouth daily.    Historical Provider, MD   BP 150/70 mmHg  Pulse 53  Temp(Src) 98.5 F (36.9 C) (Oral)  Resp 10  SpO2 97% Physical Exam  Constitutional: He is oriented to person, place, and time. He appears well-developed and well-nourished. No distress.  HENT:  Head: Normocephalic and atraumatic.  Mouth/Throat: No oropharyngeal exudate.  Eyes: EOM are normal. Pupils are equal, round, and reactive to light.  Neck: Normal range of motion. Neck supple.  Cardiovascular: Normal rate and regular rhythm.  Exam reveals no friction rub.   No murmur heard. Pulmonary/Chest: Effort normal and breath sounds normal. No respiratory distress. He has no wheezes. He has no rales.  Abdominal: Soft. He  exhibits no distension. There is no tenderness. There is no rebound.  Musculoskeletal: Normal range of motion. He exhibits no edema.  Neurological: He is alert and oriented to person, place, and time. No cranial nerve deficit. He exhibits normal muscle tone. Coordination normal.  Skin: No rash noted. He is not diaphoretic.  Nursing note and vitals reviewed.   ED Course  Procedures (including critical care time) Labs Review Labs Reviewed  CBC - Abnormal; Notable for the following:    WBC 10.7 (*)    All other components within normal limits  BASIC METABOLIC PANEL - Abnormal;  Notable for the following:    Glucose, Bld 114 (*)    All other components within normal limits  I-STAT TROPOININ, ED  Randolm Idol, ED    Imaging Review Dg Chest 2 View  08/01/2015   CLINICAL DATA:  Chest pain, question aspiration while swimming today.  EXAM: CHEST  2 VIEW  COMPARISON:  Apr 04, 2014  FINDINGS: The heart size and mediastinal contours are stable. The heart size enlarged. There is coarse mild increased pulmonary interstitium in the left lower lobe unchanged compared to prior exam. There is no focal pneumonia, pulmonary edema, or pleural effusion. The visualized skeletal structures are stable.  IMPRESSION: No active cardiopulmonary disease. Chronic changes of the left lower lobe.   Electronically Signed   By: Abelardo Diesel M.D.   On: 08/01/2015 14:02   I have personally reviewed and evaluated these images and lab results as part of my medical decision-making.   EKG Interpretation   Date/Time:  Wednesday August 01 2015 12:51:04 EDT Ventricular Rate:  66 PR Interval:  244 QRS Duration: 98 QT Interval:  396 QTC Calculation: 415 R Axis:   97 Text Interpretation:  Sinus rhythm Prolonged PR interval Inferior infarct,  old Baseline wander in lead(s) V3 V4 V6 No significant change since last  tracing Confirmed by Mingo Amber  MD, [facility] ([id number]) on 08/01/2015 1:17:13 PM     CRITICAL CARE Performed by: Osvaldo Shipper   Total critical care time: 30 minutes  Critical care time was exclusive of separately billable procedures and treating other patients.  Critical care was necessary to treat or prevent imminent or life-threatening deterioration.  Critical care was time spent personally by me on the following activities: development of treatment plan with patient and/or surrogate as well as nursing, discussions with consultants, evaluation of patient's response to treatment, examination of patient, obtaining history from patient or surrogate, ordering and performing  treatments and interventions, ordering and review of laboratory studies, ordering and review of radiographic studies, pulse oximetry and re-evaluation of patient's condition.  MDM   Final diagnoses:  NSTEMI (non-ST elevated myocardial infarction)    81 year old male here with throat burning sensation. Began to hours after swimming. Felt well multiple today. He did not aspirate any water. Denies any fever, shortness of breath, nausea, vomiting. History of a heart attack, this feels different. Here he feels well, no problems. EKG is normal. Initial troponin is normal. Will check second troponin. With weird throat closing sensation, doubt ACS. Patient will get serial troponins. He's feeling well now. 2nd troponin elevated, Cards consulted for admission. Heparin initiated.   Evelina Bucy, MD 08/01/15 1655  Evelina Bucy, MD 08/01/15 [phone]

## 2015-08-02 ENCOUNTER — Encounter (HOSPITAL_COMMUNITY): Payer: Self-pay | Admitting: General Practice

## 2015-08-02 DIAGNOSIS — M6281 Muscle weakness (generalized): Secondary | ICD-10-CM | POA: Diagnosis not present

## 2015-08-02 DIAGNOSIS — Z951 Presence of aortocoronary bypass graft: Secondary | ICD-10-CM | POA: Diagnosis not present

## 2015-08-02 DIAGNOSIS — I25709 Atherosclerosis of coronary artery bypass graft(s), unspecified, with unspecified angina pectoris: Secondary | ICD-10-CM | POA: Diagnosis not present

## 2015-08-02 DIAGNOSIS — I252 Old myocardial infarction: Secondary | ICD-10-CM | POA: Diagnosis not present

## 2015-08-02 DIAGNOSIS — R29898 Other symptoms and signs involving the musculoskeletal system: Secondary | ICD-10-CM | POA: Diagnosis not present

## 2015-08-02 DIAGNOSIS — E785 Hyperlipidemia, unspecified: Secondary | ICD-10-CM | POA: Diagnosis present

## 2015-08-02 DIAGNOSIS — Z7982 Long term (current) use of aspirin: Secondary | ICD-10-CM | POA: Diagnosis not present

## 2015-08-02 DIAGNOSIS — R001 Bradycardia, unspecified: Secondary | ICD-10-CM | POA: Diagnosis not present

## 2015-08-02 DIAGNOSIS — D696 Thrombocytopenia, unspecified: Secondary | ICD-10-CM | POA: Diagnosis present

## 2015-08-02 DIAGNOSIS — Z48812 Encounter for surgical aftercare following surgery on the circulatory system: Secondary | ICD-10-CM | POA: Diagnosis not present

## 2015-08-02 DIAGNOSIS — J95811 Postprocedural pneumothorax: Secondary | ICD-10-CM | POA: Diagnosis not present

## 2015-08-02 DIAGNOSIS — I10 Essential (primary) hypertension: Secondary | ICD-10-CM | POA: Diagnosis not present

## 2015-08-02 DIAGNOSIS — R7309 Other abnormal glucose: Secondary | ICD-10-CM | POA: Diagnosis present

## 2015-08-02 DIAGNOSIS — I2510 Atherosclerotic heart disease of native coronary artery without angina pectoris: Secondary | ICD-10-CM | POA: Diagnosis not present

## 2015-08-02 DIAGNOSIS — Z23 Encounter for immunization: Secondary | ICD-10-CM | POA: Diagnosis not present

## 2015-08-02 DIAGNOSIS — R7989 Other specified abnormal findings of blood chemistry: Secondary | ICD-10-CM | POA: Diagnosis not present

## 2015-08-02 DIAGNOSIS — I2583 Coronary atherosclerosis due to lipid rich plaque: Secondary | ICD-10-CM | POA: Diagnosis present

## 2015-08-02 DIAGNOSIS — I440 Atrioventricular block, first degree: Secondary | ICD-10-CM | POA: Diagnosis not present

## 2015-08-02 DIAGNOSIS — I340 Nonrheumatic mitral (valve) insufficiency: Secondary | ICD-10-CM | POA: Diagnosis not present

## 2015-08-02 DIAGNOSIS — J90 Pleural effusion, not elsewhere classified: Secondary | ICD-10-CM | POA: Diagnosis not present

## 2015-08-02 DIAGNOSIS — R1312 Dysphagia, oropharyngeal phase: Secondary | ICD-10-CM | POA: Diagnosis not present

## 2015-08-02 DIAGNOSIS — J9811 Atelectasis: Secondary | ICD-10-CM | POA: Diagnosis not present

## 2015-08-02 DIAGNOSIS — I25110 Atherosclerotic heart disease of native coronary artery with unstable angina pectoris: Secondary | ICD-10-CM | POA: Diagnosis not present

## 2015-08-02 DIAGNOSIS — Z955 Presence of coronary angioplasty implant and graft: Secondary | ICD-10-CM | POA: Diagnosis not present

## 2015-08-02 DIAGNOSIS — I214 Non-ST elevation (NSTEMI) myocardial infarction: Secondary | ICD-10-CM | POA: Diagnosis not present

## 2015-08-02 DIAGNOSIS — R41841 Cognitive communication deficit: Secondary | ICD-10-CM | POA: Diagnosis not present

## 2015-08-02 DIAGNOSIS — J15211 Pneumonia due to Methicillin susceptible Staphylococcus aureus: Secondary | ICD-10-CM | POA: Diagnosis not present

## 2015-08-02 DIAGNOSIS — R2681 Unsteadiness on feet: Secondary | ICD-10-CM | POA: Diagnosis not present

## 2015-08-02 DIAGNOSIS — D62 Acute posthemorrhagic anemia: Secondary | ICD-10-CM | POA: Diagnosis not present

## 2015-08-02 DIAGNOSIS — Z01810 Encounter for preprocedural cardiovascular examination: Secondary | ICD-10-CM | POA: Diagnosis not present

## 2015-08-02 DIAGNOSIS — Z4682 Encounter for fitting and adjustment of non-vascular catheter: Secondary | ICD-10-CM | POA: Diagnosis not present

## 2015-08-02 DIAGNOSIS — E8770 Fluid overload, unspecified: Secondary | ICD-10-CM | POA: Diagnosis not present

## 2015-08-02 DIAGNOSIS — R079 Chest pain, unspecified: Secondary | ICD-10-CM | POA: Diagnosis not present

## 2015-08-02 LAB — CBC
HCT: 42.2 % (ref 39.0–52.0)
Hemoglobin: 14.3 g/dL (ref 13.0–17.0)
MCH: 30.7 pg (ref 26.0–34.0)
MCHC: 33.9 g/dL (ref 30.0–36.0)
MCV: 90.6 fL (ref 78.0–100.0)
PLATELETS: 180 10*3/uL (ref 150–400)
RBC: 4.66 MIL/uL (ref 4.22–5.81)
RDW: 13.5 % (ref 11.5–15.5)
WBC: 8 10*3/uL (ref 4.0–10.5)

## 2015-08-02 LAB — LIPID PANEL
CHOL/HDL RATIO: 3.2 ratio
CHOLESTEROL: 133 mg/dL (ref 0–200)
HDL: 42 mg/dL (ref >40)
LDL Cholesterol: 78 mg/dL (ref 0–99)
Triglycerides: 64 mg/dL (ref <150)
VLDL: 13 mg/dL (ref 0–40)

## 2015-08-02 LAB — HEPARIN LEVEL (UNFRACTIONATED)
HEPARIN UNFRACTIONATED: 0.23 [IU]/mL — AB (ref 0.30–0.70)
Heparin Unfractionated: 0.41 [IU]/mL (ref 0.30–0.70)
Heparin Unfractionated: 0.27 IU/mL — ABNORMAL LOW (ref 0.30–0.70)
Heparin Unfractionated: 0.4 IU/mL (ref 0.30–0.70)

## 2015-08-02 LAB — BASIC METABOLIC PANEL
Anion gap: 6 (ref 5–15)
BUN: 13 mg/dL (ref 6–20)
CHLORIDE: 105 mmol/L (ref 101–111)
CO2: 29 mmol/L (ref 22–32)
CREATININE: 1 mg/dL (ref 0.61–1.24)
Calcium: 9.3 mg/dL (ref 8.9–10.3)
GFR calc Af Amer: >60 mL/min (ref >60)
GFR calc non Af Amer: >60 mL/min (ref >60)
Glucose, Bld: 98 mg/dL (ref 65–99)
Potassium: 4 mmol/L (ref 3.5–5.1)
Sodium: 140 mmol/L (ref 135–145)

## 2015-08-02 LAB — TROPONIN I
TROPONIN I: 1.73 ng/mL — AB (ref <0.031)
Troponin I: 1.53 ng/mL (ref <0.031)
Troponin I: 1.68 ng/mL (ref <0.031)
Troponin I: 1.19 ng/mL (ref <0.031)

## 2015-08-02 MED ORDER — SODIUM CHLORIDE 0.9 % IV SOLN: 250.0000 mL | INTRAVENOUS | Status: DC | PRN

## 2015-08-02 MED ORDER — INFLUENZA VAC SPLIT QUAD 0.5 ML IM SUSY
0.5000 mL | PREFILLED_SYRINGE | INTRAMUSCULAR | Status: DC
  Filled 2015-08-02: qty 0.5

## 2015-08-02 MED ORDER — SODIUM CHLORIDE 0.9 % IJ SOLN: 3.0000 mL | INTRAMUSCULAR | Status: DC | PRN

## 2015-08-02 MED ORDER — SODIUM CHLORIDE 0.9 % IJ SOLN
3.0000 mL | Freq: Two times a day (BID) | INTRAMUSCULAR | Status: DC
  Administered 2015-08-02 (×2): 3 mL via INTRAVENOUS

## 2015-08-02 MED ORDER — SODIUM CHLORIDE 0.9 % WEIGHT BASED INFUSION
1.0000 mL/kg/h | INTRAVENOUS | Status: DC
  Administered 2015-08-02 – 2015-08-03 (×2): 1 mL/kg/h via INTRAVENOUS

## 2015-08-02 MED ORDER — METOPROLOL SUCCINATE ER 25 MG PO TB24: 12.5000 mg | ORAL_TABLET | Freq: Every day | ORAL | Status: DC

## 2015-08-02 MED ORDER — LISINOPRIL 40 MG PO TABS
40.0000 mg | ORAL_TABLET | Freq: Every day | ORAL | Status: DC
  Administered 2015-08-02 – 2015-08-06 (×5): 40 mg via ORAL
  Filled 2015-08-02 (×5): qty 1

## 2015-08-02 NOTE — Progress Notes (Signed)
Patient's medical records/cath report from [facility] and [facility] are in patient's shadow chart.  Isaac [name]

## 2015-08-02 NOTE — Progress Notes (Signed)
Patient Name: Isaac [name] Date of Encounter: 08/02/2015  Principal Problem:   Hypertensive urgency Active Problems:   Coronary artery disease due to lipid rich plaque   Old MI (myocardial infarction)   Throat pain   Elevated troponin  SUBJECTIVE  Denies chest pain, sob or palpitations. Patient prefers exercise Myoview.   CURRENT MEDS . aspirin EC  81 mg Oral Daily  . donepezil  5 mg Oral QHS  . metoprolol succinate  12.5 mg Oral Daily  . pravastatin  40 mg Oral QPM    OBJECTIVE  Filed Vitals:   08/01/15 1900 08/01/15 2048 08/02/15 0428 08/02/15 0807  BP: 154/61 152/75 134/74 139/65  Pulse: 51 59 59   Temp:  98.1 F (36.7 C) 98 F (36.7 C)   TempSrc:  Oral Axillary   Resp: 13 18 97   Height:   5\' 5"  (1.651 m)   Weight:   153 lb 6.4 oz (69.582 kg)   SpO2: 99% 97% 98%     Intake/Output Summary (Last 24 hours) at 08/02/15 0826 Last data filed at 08/01/15 2111  Gross per 24 hour  Intake    120 ml  Output      0 ml  Net    120 ml   Filed Weights   08/01/15 1724 08/02/15 0428  Weight: 162 lb 0.6 oz (73.5 kg) 153 lb 6.4 oz (69.582 kg)    PHYSICAL EXAM  General: Pleasant, NAD. Neuro: Alert and oriented X 3. Moves all extremities spontaneously. Psych: Normal affect. HEENT:  Normal  Neck: Supple without bruits or JVD. Lungs:  Resp regular and unlabored, CTA. Heart: RRR no s3, s4, or murmurs. Abdomen: Soft, non-tender, non-distended, BS + x 4.  Extremities: No clubbing, cyanosis or edema. DP/PT/Radials 2+ and equal bilaterally.  Accessory Clinical Findings  CBC  Recent Labs  08/01/15 1339 08/02/15 0551  WBC 10.7* 8.0  HGB 14.4 14.3  HCT 41.9 42.2  MCV 90.1 90.6  PLT 187 355   Basic Metabolic Panel  Recent Labs  08/01/15 1339 08/02/15 0551  NA 136 140  K 3.9 4.0  CL 104 105  CO2 26 29  GLUCOSE 114* 98  BUN 15 13  CREATININE 0.99 1.00  CALCIUM 9.3 9.3   Cardiac Enzymes  Recent Labs  08/01/15 2109 08/02/15 0100 08/02/15 0620    TROPONINI 1.33* 1.53* 1.73*   Fasting Lipid Panel  Recent Labs  08/02/15 0551  CHOL 133  HDL 42  LDLCALC 78  TRIG 64  CHOLHDL 3.2   Thyroid Function Tests  Recent Labs  08/01/15 2109  TSH 2.646    TELE  Sinus brady at rate of 40-60s. Transient rate in high 30s.   Radiology/Studies  Dg Chest 2 View  08/01/2015   CLINICAL DATA:  Chest pain, question aspiration while swimming today.  EXAM: CHEST  2 VIEW  COMPARISON:  Apr 04, 2014  FINDINGS: The heart size and mediastinal contours are stable. The heart size enlarged. There is coarse mild increased pulmonary interstitium in the left lower lobe unchanged compared to prior exam. There is no focal pneumonia, pulmonary edema, or pleural effusion. The visualized skeletal structures are stable.  IMPRESSION: No active cardiopulmonary disease. Chronic changes of the left lower lobe.   Electronically Signed   By: Abelardo Diesel M.D.   On: 08/01/2015 14:02    ASSESSMENT AND PLAN   1. Elevated troponin/ NSTEMI: - Initial trop was 0.32 in a setting of a hypertensive urgery (202/100). Now BP stable  to 139/65, however Troponine trending up 0.87-->0.33-->1.53-->1.73.  - EKG this morning showed a sinus bradycardia with 1st degree AV block and non specific ST and T wave abnormality in V5 and V6.He is NPO and schedule to Myoview later today. He prefers Exercise Myoview. Hold Toprol prior to Myoview. Likely patent need a cath. Will get another set of troponin.   2. CAD - Reviewing office note 07/06/15 -2 prior stents placed in 2005 in the setting of MI. [place], [place]. He was told on second catheterization in 2010 in the setting of mild MI that he had good collateral blood flow, no PCI. - Unknown type and anatomy. Will request records.   3. HTN - As above  Signed, Bhagat,Bhavinkumar PA-C Pager [phone] As above, patient seen and examined. Patient denies chest pain or dyspnea. He has ruled in for a non-ST elevation myocardial  infarction. Electrocardiogram shows new T-wave changes in lateral leads. Continue aspirin, heparin and statin. Patient with significant bradycardia overnight. Discontinue Toprol. Plan cardiac catheterization tomorrow. The risks and benefits were discussed and the patient agrees to proceed. Note the schedule will not allow procedure today. Add lisinopril 40 mg daily for blood pressure. Kirk Ruths

## 2015-08-02 NOTE — Progress Notes (Signed)
ANTICOAGULATION CONSULT NOTE - Follow Up Consult  Pharmacy Consult for Heparin Indication: chest pain/ACS  No Known Allergies  Patient Measurements: Weight: 162 lb 0.6 oz (73.5 kg)  Vital Signs: Temp: 98.1 F (36.7 C) (09/07 2048) Temp Source: Oral (09/07 2048) BP: 152/75 mmHg (09/07 2048) Pulse Rate: 59 (09/07 2048)  Labs:  Recent Labs  08/01/15 1339 08/01/15 1931 08/01/15 2109 08/02/15 0100  HGB 14.4  --   --   --   HCT 41.9  --   --   --   PLT 187  --   --   --   HEPARINUNFRC  --   --   --  0.41  CREATININE 0.99  --   --   --   TROPONINI  --  0.87* 1.33* 1.53*    Estimated Creatinine Clearance: 52.6 mL/min (by C-G formula based on Cr of 0.99).   Assessment: 81 y.o. male on heparin for r/o ACS. Plan for myocardial perfusion study today. Heparin level therapeutic (0.41) on 850 units/hr. No bleeding noted.   Goal of Therapy:  Heparin level 0.3-0.7 units/ml Monitor platelets by anticoagulation protocol: Yes   Plan:  Continue heparin at 850 units/hr F/u 6 hr confirmatory heparin level  Sherlon Handing, PharmD, BCPS Clinical pharmacist, pager [phone] 08/02/2015,2:29 AM

## 2015-08-02 NOTE — Progress Notes (Addendum)
ANTICOAGULATION CONSULT NOTE - Follow Up Consult  Pharmacy Consult for Heparin Indication: chest pain/ACS  No Known Allergies  Patient Measurements: Height: 5\' 5"  (165.1 cm) Weight: 153 lb 6.4 oz (69.582 kg) IBW/kg (Calculated) : 61.5  Vital Signs: Temp: 98 F (36.7 C) (09/08 0428) Temp Source: Axillary (09/08 0428) BP: 134/74 mmHg (09/08 0428) Pulse Rate: 59 (09/08 0428)  Labs:  Recent Labs  08/01/15 1339  08/01/15 2109 08/02/15 0100 08/02/15 0551 08/02/15 0620  HGB 14.4  --   --   --  14.3  --   HCT 41.9  --   --   --  42.2  --   PLT 187  --   --   --  180  --   HEPARINUNFRC  --   --   --  0.41 0.40 0.27*  CREATININE 0.99  --   --   --  1.00  --   TROPONINI  --   < > 1.33* 1.53*  --  1.73*  < > = values in this interval not displayed.  Estimated Creatinine Clearance: 45.3 mL/min (by C-G formula based on Cr of 1).   Assessment: 81 y.o. male on heparin for r/o ACS. Plan for myocardial perfusion study today. Heparin level therapeutic down to slightly subtherapeutic (0.27) on 850 units/hr. No bleeding noted.   Goal of Therapy:  Heparin level 0.3-0.7 units/ml Monitor platelets by anticoagulation protocol: Yes   Plan:  Increase heparin to 950 units/hr F/u 8 hr heparin level  Sherlon Handing, PharmD, BCPS Clinical pharmacist, pager [phone] 08/02/2015,7:32 AM  ADDN: Follow-up HL remains subtherapeutic at 0.23 on heparin 950 units/hr. Nurse reports no issues with infusion or bleeding.   Plan: Increase heparin to 1150 units/hr 8h HL Daily HL/CBC   Andrey Cota. Diona Foley, PharmD Clinical Pharmacist Pager [phone]

## 2015-08-03 ENCOUNTER — Other Ambulatory Visit: Payer: Self-pay | Admitting: *Deleted

## 2015-08-03 ENCOUNTER — Encounter (HOSPITAL_COMMUNITY): Payer: Self-pay | Admitting: Cardiology

## 2015-08-03 ENCOUNTER — Encounter (HOSPITAL_COMMUNITY)
Admission: EM | Disposition: A | Discharge status: Skilled Nursing Facility | Payer: Self-pay | Source: Home / Self Care | Attending: Cardiology

## 2015-08-03 DIAGNOSIS — I2510 Atherosclerotic heart disease of native coronary artery without angina pectoris: Secondary | ICD-10-CM

## 2015-08-03 DIAGNOSIS — R001 Bradycardia, unspecified: Secondary | ICD-10-CM

## 2015-08-03 DIAGNOSIS — I25110 Atherosclerotic heart disease of native coronary artery with unstable angina pectoris: Secondary | ICD-10-CM

## 2015-08-03 HISTORY — PX: CARDIAC CATHETERIZATION: SHX172

## 2015-08-03 LAB — CBC
HEMATOCRIT: 41.2 % (ref 39.0–52.0)
HEMOGLOBIN: 14.1 g/dL (ref 13.0–17.0)
MCH: 30.7 pg (ref 26.0–34.0)
MCHC: 34.2 g/dL (ref 30.0–36.0)
MCV: 89.8 fL (ref 78.0–100.0)
Platelets: 184 10*3/uL (ref 150–400)
RBC: 4.59 MIL/uL (ref 4.22–5.81)
RDW: 13.5 % (ref 11.5–15.5)
WBC: 8.4 10*3/uL (ref 4.0–10.5)

## 2015-08-03 LAB — PROTIME-INR
INR: 1.2 (ref 0.00–1.49)
PROTHROMBIN TIME: 15.4 s — AB (ref 11.6–15.2)

## 2015-08-03 LAB — POCT ACTIVATED CLOTTING TIME: ACTIVATED CLOTTING TIME: 276 s

## 2015-08-03 LAB — HEPARIN LEVEL (UNFRACTIONATED): Heparin Unfractionated: 0.44 IU/mL (ref 0.30–0.70)

## 2015-08-03 SURGERY — LEFT HEART CATH AND CORONARY ANGIOGRAPHY

## 2015-08-03 MED ORDER — ADENOSINE (DIAGNOSTIC) 140MCG/KG/MIN: INTRAVENOUS | Status: DC | PRN

## 2015-08-03 MED ORDER — SODIUM CHLORIDE 0.9 % WEIGHT BASED INFUSION
3.0000 mL/kg/h | INTRAVENOUS | Status: AC
  Administered 2015-08-03: 3 mL/kg/h via INTRAVENOUS

## 2015-08-03 MED ORDER — HEPARIN (PORCINE) IN NACL 2-0.9 UNIT/ML-% IJ SOLN
INTRAMUSCULAR | Status: AC
  Filled 2015-08-03: qty 1500

## 2015-08-03 MED ORDER — HEPARIN (PORCINE) IN NACL 100-0.45 UNIT/ML-% IJ SOLN: 1150.0000 [IU]/h | INTRAMUSCULAR | Status: DC

## 2015-08-03 MED ORDER — SODIUM CHLORIDE 0.9 % IJ SOLN
3.0000 mL | Freq: Two times a day (BID) | INTRAMUSCULAR | Status: DC
  Administered 2015-08-03 – 2015-08-06 (×6): 3 mL via INTRAVENOUS

## 2015-08-03 MED ORDER — IOHEXOL 350 MG/ML SOLN
INTRAVENOUS | Status: DC | PRN
  Administered 2015-08-03: 85 mL via INTRACARDIAC

## 2015-08-03 MED ORDER — HEPARIN SODIUM (PORCINE) 1000 UNIT/ML IJ SOLN
INTRAMUSCULAR | Status: DC | PRN
  Administered 2015-08-03: 3000 [IU] via INTRAVENOUS
  Administered 2015-08-03: 4000 [IU] via INTRAVENOUS

## 2015-08-03 MED ORDER — HEPARIN (PORCINE) IN NACL 100-0.45 UNIT/ML-% IJ SOLN
1150.0000 [IU]/h | INTRAMUSCULAR | Status: DC
  Administered 2015-08-04 – 2015-08-06 (×3): 1150 [IU]/h via INTRAVENOUS
  Filled 2015-08-03 (×4): qty 250

## 2015-08-03 MED ORDER — NITROGLYCERIN 1 MG/10 ML FOR IR/CATH LAB
INTRA_ARTERIAL | Status: DC | PRN
  Administered 2015-08-03: 200 ug via INTRACORONARY

## 2015-08-03 MED ORDER — FENTANYL CITRATE (PF) 100 MCG/2ML IJ SOLN
INTRAMUSCULAR | Status: AC
  Filled 2015-08-03: qty 4

## 2015-08-03 MED ORDER — LIDOCAINE HCL (PF) 1 % IJ SOLN
INTRAMUSCULAR | Status: AC
  Filled 2015-08-03: qty 30

## 2015-08-03 MED ORDER — MIDAZOLAM HCL 2 MG/2ML IJ SOLN
INTRAMUSCULAR | Status: DC | PRN
  Administered 2015-08-03: 1 mg via INTRAVENOUS

## 2015-08-03 MED ORDER — ADENOSINE 12 MG/4ML IV SOLN
12.0000 mL | Freq: Once | INTRAVENOUS | Status: AC
  Administered 2015-08-03: 36 mg via INTRAVENOUS
  Filled 2015-08-03: qty 12

## 2015-08-03 MED ORDER — VERAPAMIL HCL 2.5 MG/ML IV SOLN
INTRAVENOUS | Status: DC | PRN
  Administered 2015-08-03: 10:00:00 via INTRA_ARTERIAL

## 2015-08-03 MED ORDER — SODIUM CHLORIDE 0.9 % IJ SOLN: 3.0000 mL | INTRAMUSCULAR | Status: DC | PRN

## 2015-08-03 MED ORDER — NITROGLYCERIN 1 MG/10 ML FOR IR/CATH LAB
INTRA_ARTERIAL | Status: AC
  Filled 2015-08-03: qty 10

## 2015-08-03 MED ORDER — HEPARIN SODIUM (PORCINE) 1000 UNIT/ML IJ SOLN
INTRAMUSCULAR | Status: AC
  Filled 2015-08-03: qty 1

## 2015-08-03 MED ORDER — FENTANYL CITRATE (PF) 100 MCG/2ML IJ SOLN
INTRAMUSCULAR | Status: DC | PRN
  Administered 2015-08-03: 25 ug via INTRAVENOUS

## 2015-08-03 MED ORDER — SODIUM CHLORIDE 0.9 % IV SOLN: 250.0000 mL | INTRAVENOUS | Status: DC | PRN

## 2015-08-03 MED ORDER — VERAPAMIL HCL 2.5 MG/ML IV SOLN
INTRAVENOUS | Status: AC
  Filled 2015-08-03: qty 2

## 2015-08-03 MED ORDER — LIDOCAINE HCL (PF) 1 % IJ SOLN
INTRAMUSCULAR | Status: DC | PRN
  Administered 2015-08-03: 11:00:00

## 2015-08-03 MED ORDER — MIDAZOLAM HCL 2 MG/2ML IJ SOLN
INTRAMUSCULAR | Status: AC
  Filled 2015-08-03: qty 4

## 2015-08-03 SURGICAL SUPPLY — 14 items
CATH INFINITI 5 FR JL3.5 (CATHETERS) ×4 IMPLANT
CATH INFINITI 5FR ANG PIGTAIL (CATHETERS) ×4 IMPLANT
CATH INFINITI JR4 5F (CATHETERS) ×4 IMPLANT
CATH VISTA GUIDE 6FR XBLAD3.5 (CATHETERS) ×4 IMPLANT
DEVICE RAD COMP TR BAND LRG (VASCULAR PRODUCTS) ×4 IMPLANT
GLIDESHEATH SLEND SS 6F .021 (SHEATH) ×4 IMPLANT
GUIDEWIRE PRESSURE COMET II (WIRE) ×4 IMPLANT
KIT ESSENTIALS PG (KITS) ×4 IMPLANT
KIT HEART LEFT (KITS) ×4 IMPLANT
PACK CARDIAC CATHETERIZATION (CUSTOM PROCEDURE TRAY) ×4 IMPLANT
SYR MEDRAD MARK V 150ML (SYRINGE) ×4 IMPLANT
TRANSDUCER W/STOPCOCK (MISCELLANEOUS) ×4 IMPLANT
TUBING CIL FLEX 10 FLL-RA (TUBING) ×4 IMPLANT
WIRE SAFE-T 1.5MM-J .035X260CM (WIRE) ×4 IMPLANT

## 2015-08-03 NOTE — Progress Notes (Signed)
CSW (Clinical Education officer, museum) received call from [facility] notifying that pt is from independent living. CSW informed facility that pt dc needs are not known at this time. CSW to keep facility updated.  [place], [place]

## 2015-08-03 NOTE — Progress Notes (Signed)
ANTICOAGULATION CONSULT NOTE - Follow Up Consult  Pharmacy Consult for Heparin Indication: chest pain/ACS  No Known Allergies  Patient Measurements: Height: 5\' 5"  (165.1 cm) Weight: 153 lb 6.4 oz (69.582 kg) IBW/kg (Calculated) : 61.5  Vital Signs: Temp: 98 F (36.7 C) (09/08 2056) Temp Source: Oral (09/08 2056) BP: 124/81 mmHg (09/08 2056) Pulse Rate: 63 (09/08 2056)  Labs:  Recent Labs  08/01/15 1339  08/02/15 0551 08/02/15 0620 08/02/15 1012 08/02/15 1645 08/02/15 2020 08/03/15 0116  HGB 14.4  --  14.3  --   --   --   --  14.1  HCT 41.9  --  42.2  --   --   --   --  41.2  PLT 187  --  180  --   --   --   --  184  HEPARINUNFRC  --   < > 0.40 0.27*  --  0.23*  --  0.44  CREATININE 0.99  --  1.00  --   --   --   --   --   TROPONINI  --   < >  --  1.73* 1.68*  --  1.19*  --   < > = values in this interval not displayed.  Estimated Creatinine Clearance: 45.3 mL/min (by C-G formula based on Cr of 1).   Assessment: 81 y.o. male on heparin for r/o ACS. Heparin level therapeutic (0.44) on 1150 units/hr. CBC stable. No bleeding noted.   Goal of Therapy:  Heparin level 0.3-0.7 units/ml Monitor platelets by anticoagulation protocol: Yes   Plan:  Continue heparin at 1150 units/hr Daily heparin level and CBC  Sherlon Handing, PharmD, BCPS Clinical pharmacist, pager [phone] 08/03/2015,3:09 AM

## 2015-08-03 NOTE — Interval H&P Note (Signed)
History and Physical Interval Note:  08/03/2015 9:59 AM  Cedric Fishman  has presented today for surgery, with the diagnosis of non stemi  The various methods of treatment have been discussed with the patient and family. After consideration of risks, benefits and other options for treatment, the patient has consented to  Procedure(s): Left Heart Cath and Coronary Angiography (N/A) as a surgical intervention .  The patient's history has been reviewed, patient examined, no change in status, stable for surgery.  I have reviewed the patient's chart and labs.  Questions were answered to the patient's satisfaction.   Cath Lab Visit (complete for each Cath Lab visit)  Clinical Evaluation Leading to the Procedure:   ACS: Yes.    Non-ACS:    Anginal Classification: CCS IV  Anti-ischemic medical therapy: Minimal Therapy (1 class of medications)  Non-Invasive Test Results: No non-invasive testing performed  Prior CABG: No previous CABG        Collier Salina [facility] 08/03/2015 9:59 AM

## 2015-08-03 NOTE — H&P (View-Only) (Signed)
Patient Name: Isaac [name] Date of Encounter: 08/02/2015  Principal Problem:   Hypertensive urgency Active Problems:   Coronary artery disease due to lipid rich plaque   Old MI (myocardial infarction)   Throat pain   Elevated troponin  SUBJECTIVE  Denies chest pain, sob or palpitations. Patient prefers exercise Myoview.   CURRENT MEDS . aspirin EC  81 mg Oral Daily  . donepezil  5 mg Oral QHS  . metoprolol succinate  12.5 mg Oral Daily  . pravastatin  40 mg Oral QPM    OBJECTIVE  Filed Vitals:   08/01/15 1900 08/01/15 2048 08/02/15 0428 08/02/15 0807  BP: 154/61 152/75 134/74 139/65  Pulse: 51 59 59   Temp:  98.1 F (36.7 C) 98 F (36.7 C)   TempSrc:  Oral Axillary   Resp: 13 18 97   Height:   5\' 5"  (1.651 m)   Weight:   153 lb 6.4 oz (69.582 kg)   SpO2: 99% 97% 98%     Intake/Output Summary (Last 24 hours) at 08/02/15 0826 Last data filed at 08/01/15 2111  Gross per 24 hour  Intake    120 ml  Output      0 ml  Net    120 ml   Filed Weights   08/01/15 1724 08/02/15 0428  Weight: 162 lb 0.6 oz (73.5 kg) 153 lb 6.4 oz (69.582 kg)    PHYSICAL EXAM  General: Pleasant, NAD. Neuro: Alert and oriented X 3. Moves all extremities spontaneously. Psych: Normal affect. HEENT:  Normal  Neck: Supple without bruits or JVD. Lungs:  Resp regular and unlabored, CTA. Heart: RRR no s3, s4, or murmurs. Abdomen: Soft, non-tender, non-distended, BS + x 4.  Extremities: No clubbing, cyanosis or edema. DP/PT/Radials 2+ and equal bilaterally.  Accessory Clinical Findings  CBC  Recent Labs  08/01/15 1339 08/02/15 0551  WBC 10.7* 8.0  HGB 14.4 14.3  HCT 41.9 42.2  MCV 90.1 90.6  PLT 187 536   Basic Metabolic Panel  Recent Labs  08/01/15 1339 08/02/15 0551  NA 136 140  K 3.9 4.0  CL 104 105  CO2 26 29  GLUCOSE 114* 98  BUN 15 13  CREATININE 0.99 1.00  CALCIUM 9.3 9.3   Cardiac Enzymes  Recent Labs  08/01/15 2109 08/02/15 0100 08/02/15 0620    TROPONINI 1.33* 1.53* 1.73*   Fasting Lipid Panel  Recent Labs  08/02/15 0551  CHOL 133  HDL 42  LDLCALC 78  TRIG 64  CHOLHDL 3.2   Thyroid Function Tests  Recent Labs  08/01/15 2109  TSH 2.646    TELE  Sinus brady at rate of 40-60s. Transient rate in high 30s.   Radiology/Studies  Dg Chest 2 View  08/01/2015   CLINICAL DATA:  Chest pain, question aspiration while swimming today.  EXAM: CHEST  2 VIEW  COMPARISON:  Apr 04, 2014  FINDINGS: The heart size and mediastinal contours are stable. The heart size enlarged. There is coarse mild increased pulmonary interstitium in the left lower lobe unchanged compared to prior exam. There is no focal pneumonia, pulmonary edema, or pleural effusion. The visualized skeletal structures are stable.  IMPRESSION: No active cardiopulmonary disease. Chronic changes of the left lower lobe.   Electronically Signed   By: Abelardo Diesel M.D.   On: 08/01/2015 14:02    ASSESSMENT AND PLAN   1. Elevated troponin/ NSTEMI: - Initial trop was 0.32 in a setting of a hypertensive urgery (202/100). Now BP stable  to 139/65, however Troponine trending up 0.87-->0.33-->1.53-->1.73.  - EKG this morning showed a sinus bradycardia with 1st degree AV block and non specific ST and T wave abnormality in V5 and V6.He is NPO and schedule to Myoview later today. He prefers Exercise Myoview. Hold Toprol prior to Myoview. Likely patent need a cath. Will get another set of troponin.   2. CAD - Reviewing office note 07/06/15 -2 prior stents placed in 2005 in the setting of MI. [place], [place]. He was told on second catheterization in 2010 in the setting of mild MI that he had good collateral blood flow, no PCI. - Unknown type and anatomy. Will request records.   3. HTN - As above  Signed, Bhagat,Bhavinkumar PA-C Pager [phone] As above, patient seen and examined. Patient denies chest pain or dyspnea. He has ruled in for a non-ST elevation myocardial  infarction. Electrocardiogram shows new T-wave changes in lateral leads. Continue aspirin, heparin and statin. Patient with significant bradycardia overnight. Discontinue Toprol. Plan cardiac catheterization tomorrow. The risks and benefits were discussed and the patient agrees to proceed. Note the schedule will not allow procedure today. Add lisinopril 40 mg daily for blood pressure. Kirk Ruths

## 2015-08-03 NOTE — Progress Notes (Signed)
ANTICOAGULATION CONSULT NOTE - Follow Up Consult  Pharmacy Consult for Heparin Indication: CAD pending TCTS eval  No Known Allergies  Patient Measurements: Height: 5\' 5"  (165.1 cm) Weight: 154 lb 1.6 oz (69.899 kg) IBW/kg (Calculated) : 61.5  Vital Signs: Temp: 97.4 F (36.3 C) (09/09 0358) Temp Source: Oral (09/09 0358) BP: 145/64 mmHg (09/09 1228) Pulse Rate: 0 (09/09 1117)  Labs:  Recent Labs  08/01/15 1339  08/02/15 0551 08/02/15 0620 08/02/15 1012 08/02/15 1645 08/02/15 2020 08/03/15 0116  HGB 14.4  --  14.3  --   --   --   --  14.1  HCT 41.9  --  42.2  --   --   --   --  41.2  PLT 187  --  180  --   --   --   --  184  LABPROT  --   --   --   --   --   --   --  15.4*  INR  --   --   --   --   --   --   --  1.20  HEPARINUNFRC  --   < > 0.40 0.27*  --  0.23*  --  0.44  CREATININE 0.99  --  1.00  --   --   --   --   --   TROPONINI  --   < >  --  1.73* 1.68*  --  1.19*  --   < > = values in this interval not displayed.  Estimated Creatinine Clearance: 45.3 mL/min (by C-G formula based on Cr of 1).   Assessment: 81 y.o. male on heparin for r/o ACS. Pt is s/p cath this morning which revealed multivessel disease and plans for TCTS eval for CABG.    Heparin level was previously therapeutic on 1150 units/hr. Will restart at this rate 8 hours after TR band removed (~1400 removal).  CBC stable. No bleeding noted.   Goal of Therapy:  Heparin level 0.3-0.7 units/ml Monitor platelets by anticoagulation protocol: Yes   Plan:  Start heparin at 1150 units/hr - 8h after TR band removal - 2200 tonight Daily heparin level and CBC  Carinna Newhart, Pharm.D., BCPS Clinical Pharmacist Pager [phone] 08/03/2015 2:14 PM

## 2015-08-03 NOTE — Consult Note (Signed)
[address]       [place] [address]             [phone]        [name] Medical Record #[id number] Date of Birth: Mar 02, 1926  Referring:  Isaac [name] Primary Care:  Isaac Crutch, MD  Chief Complaint:    Chief Complaint  Patient presents with  . Chest [name]  . Aspiration    ?    History of Present Illness:      [name] is an 81 yo white male with known history of CAD.  He is S/P MI x2 with PCI with stent placement x 2 done in 2005.  He also has a history of HTN and Hyperlipidemia.  The patient is very active stating his swims competitively and works out several times per week.  He presented to the ED on 08/01/2015 with complaints of neck and throat [name].  The patient states he went swimming early that morning.  However about 1 hour after swimming he developed neck and throat [name].  His wife contact EMS.  Upon evaluation BP was elevated at 202/100.  He was treated with baby ASA and brought to the ED.  Workup in the ED showed no significant EKG changes.  His initial Troponin was also negative.  It was felt he should be observed due to hypertension and to rule out MI.  After admission his second Troponin came back elevated.  He was ruled in for MI.  Due to his history it was felt he would require cardiac catheterization.  This was done on 08/03/2015 and revealed a patent stent in the MID RCA and critical 2 Vessel CAD.  It was felt he would require CABG as previously attempted balloon angioplasty in past was unsuccessful.    He currently is chest [name] free.  He states other than his sore throat he had no other symptoms.  He does not smoke and feels he is in pretty good shape.    Current Activity/ Functional Status: Patient is independent with mobility/ambulation, transfers, ADL's, IADL's.   Zubrod Score: At the time of surgery this patient's most appropriate activity status/level should be described as: [x]     0    Normal activity, no symptoms []     1     Restricted in physical strenuous activity but ambulatory, able to do out light work []     2    Ambulatory and capable of self care, unable to do work activities, up and about                 more than 50%  Of the time                            []     3    Only limited self care, in bed greater than 50% of waking hours []     4    Completely disabled, no self care, confined to bed or chair []     5    Moribund  Past Medical History  Diagnosis Date  . MI (myocardial infarction)   . Sciatica   . Hypertension   . Coronary artery disease     Past Surgical History  Procedure Laterality Date  . Coronary stent placement    . Cholecystectomy    . Hernia repair    . Cardiac catheterization N/A 08/03/2015    Procedure: Left Heart  Cath and Coronary Angiography;  Surgeon: Isaac M Martinique, MD;  Location: [facility];  Service: Cardiovascular;  Laterality: N/A;  . Cardiac catheterization  08/03/2015    Procedure: Intravascular Pressure Wire/FFR Study;  Surgeon: Isaac M Martinique, MD;  Location: [facility];  Service: Cardiovascular;;    History  Smoking status  . Never Smoker   Smokeless tobacco  . Never Used    History  Alcohol Use No    Social History   Social History  . Marital Status: Married    Spouse Name: N/A  . Number of Children: N/A  . Years of Education: N/A   Occupational History  . Not on file.   Social History Main Topics  . Smoking status: Never Smoker   . Smokeless tobacco: Never Used  . Alcohol Use: No  . Drug Use: No  . Sexual Activity: Not on file   Other Topics Concern  . Not on file   Social History Narrative    No Known Allergies  Current Facility-Administered Medications  Medication Dose Route Frequency Provider Last Rate Last Dose  . 0.9 %  sodium chloride infusion  250 mL Intravenous PRN Isaac M Martinique, MD      . acetaminophen (TYLENOL) tablet 650 mg  650 mg Oral Q4H PRN Isaac Canales, PA-C      . aspirin EC tablet 81 mg  81 mg Oral Daily  Isaac Canales, PA-C   81 mg at 08/03/15 1228  . donepezil (ARICEPT) tablet 5 mg  5 mg Oral QHS Isaac Canales, PA-C   5 mg at 08/02/15 2102  . heparin ADULT infusion 100 units/mL (25000 units/250 mL)  1,150 Units/hr Intravenous Continuous Isaac [name], RPH      . Influenza vac split quadrivalent PF (FLUARIX) injection 0.5 mL  0.5 mL Intramuscular Tomorrow-1000 Isaac Pain, MD      . lisinopril (PRINIVIL,ZESTRIL) tablet 40 mg  40 mg Oral Daily Isaac Perla, MD   40 mg at 08/03/15 1228  . nitroGLYCERIN (NITROSTAT) SL tablet 0.4 mg  0.4 mg Sublingual Q5 Min x 3 PRN Isaac Canales, PA-C      . ondansetron [facility]) injection 4 mg  4 mg Intravenous Q6H PRN Isaac Canales, PA-C      . pravastatin (PRAVACHOL) tablet 40 mg  40 mg Oral QPM Isaac Canales, PA-C   40 mg at 08/02/15 1800  . sodium chloride 0.9 % injection 3 mL  3 mL Intravenous Q12H Isaac M Martinique, MD   3 mL at 08/03/15 1543  . sodium chloride 0.9 % injection 3 mL  3 mL Intravenous PRN Isaac M Martinique, MD        Prescriptions prior to admission  Medication Sig Dispense Refill Last Dose  . aspirin EC 81 MG tablet Take 81 mg by mouth daily.   08/01/2015 at Unknown time  . donepezil (ARICEPT) 5 MG tablet Take 5 mg by mouth at bedtime.   07/31/2015 at Unknown time  . Fish Oil-Krill Oil (KRILL OIL PLUS PO) Take 1 capsule by mouth daily.   08/01/2015 at Unknown time  . glucosamine-chondroitin 500-400 MG tablet Take 1 tablet by mouth daily.   08/01/2015 at Unknown time  . lisinopril (PRINIVIL,ZESTRIL) 20 MG tablet Take 20 mg by mouth daily.   07/31/2015 at Unknown time  . metoprolol succinate (TOPROL-XL) 25 MG 24 hr tablet Take 12.5 mg by mouth daily.   08/01/2015 at 0700  . Multiple  Vitamin (MULTIVITAMIN WITH MINERALS) TABS tablet Take 1 tablet by mouth 2 (two) times daily.   08/01/2015 at Unknown time  . pravastatin (PRAVACHOL) 40 MG tablet Take 40 mg by mouth every evening.   07/31/2015 at Unknown time  . Probiotic Product (PROBIOTIC PO) Take 1 tablet  by mouth daily.   08/01/2015 at Unknown time  . Turmeric 500 MG CAPS Take 500 mg by mouth daily.   08/01/2015 at Unknown time    History reviewed. No pertinent family history.   Review of Systems:      Cardiac Review of Systems: Y or N  Chest [name] [ n   ]  Resting SOB [  n ] Exertional SOB  [ n ]  Orthopnea [  ]   Pedal Edema [   ]    Palpitations [n  ] Syncope  [  ]   Presyncope [   ]  General Review of Systems: [Y] = yes [  ]=no Constitional: recent weight change [  ]; anorexia [  ]; fatigue [n  ]; nausea [ n ]; night sweats [  ]; fever [  ]; or chills [  ]                                                               Dental: poor dentition[y  ]; Last Dentist visit:   Eye : blurred vision [  ]; diplopia [   ]; vision changes [  ];  Amaurosis fugax[  ]; Resp: cough [n  ];  wheezing[ n ];  hemoptysis[  ]; shortness of breath[ n ]; paroxysmal nocturnal dyspnea[  ]; dyspnea on exertion[n  ]; or orthopnea[  ];  GI:  gallstones[  ], vomiting[  ];  dysphagia[n  ]; melena[  ];  hematochezia [  ]; heartburn[ n ];   Hx of  Colonoscopy[  ]; GU: kidney stones [  ]; hematuria[ n ];   dysuria [  ];  nocturia[  ];  history of     obstruction [  ]; urinary frequency [  ]             Skin: rash, swelling[  ];, hair loss[  ];  peripheral edema[n  ];  or itching[  ]; Musculosketetal: myalgias[  ];  joint swelling[  ];  joint erythema[  ];  joint [name][  ];  back [name][  ];  Heme/Lymph: bruising[  ];  bleeding[  ];  anemia[  ];  Neuro: TIA[n  ];  headaches[  ];  stroke[  ];  vertigo[  ];  seizures[  ];   paresthesias[  ];  difficulty walking[n  ];  Psych:depression[n  ]; anxiety[  ];  Endocrine: diabetes[ n ];  thyroid dysfunction[  ];  Immunizations: Flu [ y ]; Pneumococcal[  y];  Other:  Physical Exam: BP 145/64 mmHg  Pulse 0  Temp(Src) 97.4 F (36.3 C) (Oral)  Resp 0  Ht 5\' 5"  (1.651 m)  Wt 154 lb 1.6 oz (69.899 kg)  BMI 25.64 kg/m2  SpO2 0%   General appearance: alert, cooperative and no  distress Head: Normocephalic, without obvious abnormality, atraumatic Resp: clear to auscultation bilaterally Cardio: regular rate and rhythm GI: soft, non-tender; bowel sounds normal; no masses,  no organomegaly Extremities: extremities  normal, atraumatic, no cyanosis or edema Neurologic: Grossly normal  Diagnostic Studies & Laboratory data:    No chest xray done on this admission     Recent Lab Findings: Lab Results  Component Value Date   WBC 8.4 08/03/2015   HGB 14.1 08/03/2015   HCT 41.2 08/03/2015   PLT 184 08/03/2015   GLUCOSE 98 08/02/2015   CHOL 133 08/02/2015   TRIG 64 08/02/2015   HDL 42 08/02/2015   LDLCALC 78 08/02/2015   NA 140 08/02/2015   K 4.0 08/02/2015   CL 105 08/02/2015   CREATININE 1.00 08/02/2015   BUN 13 08/02/2015   CO2 29 08/02/2015   TSH 2.646 08/01/2015   INR 1.20 08/03/2015   Cath:  Dominance: Right   Left Anterior Descending   . Ost LAD lesion, 65% stenosed. discrete . Pressure wire/FFR was performed on the lesion. FFR: 0.75.   Marland Kitchen Prox LAD lesion, 30% stenosed. calcified diffuse .   Marland Kitchen First Diagonal Branch   The vessel is small in size.   Colon Flattery 1st Diag to 1st Diag lesion, 99% stenosed.   . Second Diagonal Branch   The vessel is small in size.   . Third Diagonal Branch   The vessel is small in size.     Left Circumflex   . Ost Cx to Prox Cx lesion, 95% stenosed. calcified .   Marland Kitchen Prox Cx lesion, 95% stenosed. The lesion is type C calcified located at the major branch .   Marland Kitchen First Obtuse Marginal Branch   The vessel is large in size.     Right Coronary Artery  The vessel is normal in caliber . The vessel exhibits minimal luminal irregularities. The vessel . downward take off and anterior in the RCA cusp   . Mid RCA to Dist RCA lesion, 15% stenosed. The lesion was previously treated with a stent (unknown type) greater than two years ago.      Wall Motion                 Left Heart    Left Ventricle The left  ventricular size is normal. There is severe left ventricular systolic dysfunction. The left ventricular ejection fraction is 55-65% by visual estimate. There are no wall motion abnormalities in the left ventricle.   Mitral Valve There is no mitral valve regurgitation.   Aortic Valve There is no aortic valve stenosis. The aortic valve is calcified.   I have discussed cabg with patient and wife. Risks and options have been discussed Assessment / Plan:      1. CAD- H/O PCI x 2 in 2005, now with acute MI- needs CABG- likely late next week 2. HTN- management per Cards  I have discussed with the patient and his wife risks and options of CABG in stetting of his coronary artery anatomy. He is aware of increased risk and risk of prolonged recovery time with his age. He is willing to proceed. Tentative plan for surgery Tuesday afternoon.  I  spent 40 minutes counseling the patient face to face and 50% or more the  time was spent in counseling and coordination of care. The total time spent in the appointment was 60 minutes.    [name] Isaac MD      [address] [place] [address] Office [phone]   Beeper [phone]  08/03/2015 5:38 PM

## 2015-08-03 NOTE — Progress Notes (Signed)
Patient Name: Isaac [name] Date of Encounter: 08/03/2015  Principal Problem:   Hypertensive urgency Active Problems:   Coronary artery disease due to lipid rich plaque   Old MI (myocardial infarction)   Throat pain   Elevated troponin  SUBJECTIVE  Denies chest pain, sob or palpitations. Cath today.   CURRENT MEDS . aspirin EC  81 mg Oral Daily  . donepezil  5 mg Oral QHS  . Influenza vac split quadrivalent PF  0.5 mL Intramuscular Tomorrow-1000  . lisinopril  40 mg Oral Daily  . pravastatin  40 mg Oral QPM  . sodium chloride  3 mL Intravenous Q12H    OBJECTIVE  Filed Vitals:   08/02/15 0807 08/02/15 1808 08/02/15 2056 08/03/15 0358  BP: 139/65 126/57 124/81 148/66  Pulse:  62 63 58  Temp:  97.8 F (36.6 C) 98 F (36.7 C) 97.4 F (36.3 C)  TempSrc:  Oral Oral Oral  Resp:   18 18  Height:      Weight:    154 lb 1.6 oz (69.899 kg)  SpO2:  96% 97% 99%    Intake/Output Summary (Last 24 hours) at 08/03/15 0744 Last data filed at 08/02/15 2023  Gross per 24 hour  Intake 846.02 ml  Output      0 ml  Net 846.02 ml   Filed Weights   08/01/15 1724 08/02/15 0428 08/03/15 0358  Weight: 162 lb 0.6 oz (73.5 kg) 153 lb 6.4 oz (69.582 kg) 154 lb 1.6 oz (69.899 kg)    PHYSICAL EXAM  General: Pleasant, NAD. Neuro: Alert and oriented X 3. Moves all extremities spontaneously. Psych: Normal affect. HEENT:  Normal  Neck: Supple without bruits or JVD. Lungs:  Resp regular and unlabored, CTA. Heart: RRR no s3, s4, or murmurs. Abdomen: Soft, non-tender, non-distended, BS + x 4.  Extremities: No clubbing, cyanosis or edema. DP/PT/Radials 2+ and equal bilaterally.  Accessory Clinical Findings  CBC  Recent Labs  08/02/15 0551 08/03/15 0116  WBC 8.0 8.4  HGB 14.3 14.1  HCT 42.2 41.2  MCV 90.6 89.8  PLT 180 176   Basic Metabolic Panel  Recent Labs  08/01/15 1339 08/02/15 0551  NA 136 140  K 3.9 4.0  CL 104 105  CO2 26 29  GLUCOSE 114* 98  BUN 15 13    CREATININE 0.99 1.00  CALCIUM 9.3 9.3   Cardiac Enzymes  Recent Labs  08/02/15 0620 08/02/15 1012 08/02/15 2020  TROPONINI 1.73* 1.68* 1.19*   Fasting Lipid Panel  Recent Labs  08/02/15 0551  CHOL 133  HDL 42  LDLCALC 78  TRIG 64  CHOLHDL 3.2   Thyroid Function Tests  Recent Labs  08/01/15 2109  TSH 2.646    TELE  Sinus brady at rate of 40-50s. Transient rate in 38.  Radiology/Studies  Dg Chest 2 View  08/01/2015   CLINICAL DATA:  Chest pain, question aspiration while swimming today.  EXAM: CHEST  2 VIEW  COMPARISON:  Apr 04, 2014  FINDINGS: The heart size and mediastinal contours are stable. The heart size enlarged. There is coarse mild increased pulmonary interstitium in the left lower lobe unchanged compared to prior exam. There is no focal pneumonia, pulmonary edema, or pleural effusion. The visualized skeletal structures are stable.  IMPRESSION: No active cardiopulmonary disease. Chronic changes of the left lower lobe.   Electronically Signed   By: Abelardo Diesel M.D.   On: 08/01/2015 14:02    ASSESSMENT AND PLAN   1. Elevated  troponin/ NSTEMI: - Peak of trop was 1.73, now trending down - EKG this morning showed more prominent TWI in lateral leads. Cath today.   2. CAD - Received records from [place], [place]. - Cath 11/09/2012 showed proximal to mid circumflex 95% stenosed, unable to perform PCI due to chronic. Unsure PLV patient. Doubt rotablator option. CTA or recath RCA. CABG may be best options.  - CT CTA of coronary showed patent previous stent in proximal-mid, mid and distal RCA. Unknown type.  - Echo 11/2004 - LV EF of 50-60%, mild mitral regur and trace tricuspid regur  3. HTN - Added lisinopril 40mg .   4. Sinus Bradycardia - Discontinued Troprol. Still rate in 40-50s. 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 His sons was also born with a low heart rate.   Signed, Bhagat,Bhavinkumar PA-C Pager [phone] As above, patient  seen and examined; no chest pain or dyspnea; cath results noted; plan surgical consult for CABG; continue present meds. Remains mildly bradycardic; will continue to avoid beta blocker. Kirk Ruths

## 2015-08-04 ENCOUNTER — Inpatient Hospital Stay (HOSPITAL_COMMUNITY): Payer: Medicare Other

## 2015-08-04 DIAGNOSIS — Z01810 Encounter for preprocedural cardiovascular examination: Secondary | ICD-10-CM

## 2015-08-04 LAB — HEPARIN LEVEL (UNFRACTIONATED)
HEPARIN UNFRACTIONATED: 0.44 [IU]/mL (ref 0.30–0.70)
Heparin Unfractionated: 0.3 IU/mL (ref 0.30–0.70)

## 2015-08-04 LAB — CBC
HCT: 41.2 % (ref 39.0–52.0)
Hemoglobin: 14.2 g/dL (ref 13.0–17.0)
MCH: 31.2 pg (ref 26.0–34.0)
MCHC: 34.5 g/dL (ref 30.0–36.0)
MCV: 90.5 fL (ref 78.0–100.0)
PLATELETS: 191 10*3/uL (ref 150–400)
RBC: 4.55 MIL/uL (ref 4.22–5.81)
RDW: 13.5 % (ref 11.5–15.5)
WBC: 9.2 10*3/uL (ref 4.0–10.5)

## 2015-08-04 NOTE — Progress Notes (Signed)
Patient Name: Isaac [name] Date of Encounter: 08/04/2015     Principal Problem:   Hypertensive urgency Active Problems:   Coronary artery disease due to lipid rich plaque   Old MI (myocardial infarction)   Throat pain   Elevated troponin   NSTEMI (non-ST elevated myocardial infarction)    SUBJECTIVE  The patient remains on IV heparin.  He has had no further chest discomfort or throat discomfort.  He will be undergoing CABG on Tuesday with Dr. Servando Snare. He is anxious to try walking in the hall.  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  CURRENT MEDS . aspirin EC  81 mg Oral Daily  . donepezil  5 mg Oral QHS  . Influenza vac split quadrivalent PF  0.5 mL Intramuscular Tomorrow-1000  . lisinopril  40 mg Oral Daily  . pravastatin  40 mg Oral QPM  . sodium chloride  3 mL Intravenous Q12H    OBJECTIVE  Filed Vitals:   08/04/15 0245 08/04/15 0456 08/04/15 0645 08/04/15 0926  BP: 136/49 132/46 127/49 134/63  Pulse: 47 77 49   Temp:  97.9 F (36.6 C)    TempSrc:  Oral    Resp: 16 18 18    Height:      Weight:  153 lb 10.6 oz (69.7 kg)    SpO2: 97% 97% 97%     Intake/Output Summary (Last 24 hours) at 08/04/15 1134 Last data filed at 08/04/15 0928  Gross per 24 hour  Intake 130.64 ml  Output   1400 ml  Net -1269.36 ml   Filed Weights   08/02/15 0428 08/03/15 0358 08/04/15 0456  Weight: 153 lb 6.4 oz (69.582 kg) 154 lb 1.6 oz (69.899 kg) 153 lb 10.6 oz (69.7 kg)    PHYSICAL EXAM  General: Pleasant, NAD. Neuro: Alert and oriented X 3. Moves all extremities spontaneously. Psych: Normal affect. HEENT:  Normal  Neck: Supple without bruits or JVD. Lungs:  Resp regular and unlabored, CTA. Heart: RRR no s3, s4, or murmurs. Abdomen: Soft, non-tender, non-distended, BS + x 4.  Extremities: No clubbing, cyanosis or edema. DP/PT/Radials 2+ and equal bilaterally.  Right radial pulse intact following catheter  Accessory Clinical  Findings  CBC  Recent Labs  08/03/15 0116 08/04/15 0344  WBC 8.4 9.2  HGB 14.1 14.2  HCT 41.2 41.2  MCV 89.8 90.5  PLT 184 440   Basic Metabolic Panel  Recent Labs  08/01/15 1339 08/02/15 0551  NA 136 140  K 3.9 4.0  CL 104 105  CO2 26 29  GLUCOSE 114* 98  BUN 15 13  CREATININE 0.99 1.00  CALCIUM 9.3 9.3   Liver Function Tests No results for input(s): AST, ALT, ALKPHOS, BILITOT, PROT, ALBUMIN in the last 72 hours. No results for input(s): LIPASE, AMYLASE in the last 72 hours. Cardiac Enzymes  Recent Labs  08/02/15 0620 08/02/15 1012 08/02/15 2020  TROPONINI 1.73* 1.68* 1.19*   BNP Invalid input(s): POCBNP D-Dimer No results for input(s): DDIMER in the last 72 hours. Hemoglobin A1C No results for input(s): HGBA1C in the last 72 hours. Fasting Lipid Panel  Recent Labs  08/02/15 0551  CHOL 133  HDL 42  LDLCALC 78  TRIG 64  CHOLHDL 3.2   Thyroid Function Tests  Recent Labs  08/01/15 2109  TSH 2.646    TELE  Normal sinus rhythm  ECG    Radiology/Studies  Dg Chest 2 View  08/01/2015   CLINICAL DATA:  Chest pain,  question aspiration while swimming today.  EXAM: CHEST  2 VIEW  COMPARISON:  Apr 04, 2014  FINDINGS: The heart size and mediastinal contours are stable. The heart size enlarged. There is coarse mild increased pulmonary interstitium in the left lower lobe unchanged compared to prior exam. There is no focal pneumonia, pulmonary edema, or pleural effusion. The visualized skeletal structures are stable.  IMPRESSION: No active cardiopulmonary disease. Chronic changes of the left lower lobe.   Electronically Signed   By: Abelardo Diesel M.D.   On: 08/01/2015 14:02    ASSESSMENT AND PLAN 1.  Non-STEMI 2.  Essential hypertension  Plan: Continue IV heparin.  CABG planned for Tuesday.  Increase activity today.  Signed, Darlin Coco MD

## 2015-08-04 NOTE — Progress Notes (Signed)
Pre-op Cardiac Surgery  Carotid Findings:  1-39% ICA plaquing.  Vertebral artery flow is antegrade.   Upper Extremity Right Left  Brachial Pressures 162T 154T  Radial Waveforms T T  Ulnar Waveforms T T  Palmar Arch (Allen's Test) WNL WNL   Findings:  WNL    Lower  Extremity Right Left  Dorsalis Pedis    Anterior Tibial    Posterior Tibial    Ankle/Brachial Indices      Findings:

## 2015-08-04 NOTE — Progress Notes (Signed)
Monitor showed 2.56 sec pause. Pt A&A Pt asymptomatic No c/o dizzyness. VS obtained.

## 2015-08-04 NOTE — Progress Notes (Signed)
ANTICOAGULATION CONSULT NOTE - Follow Up Consult  Pharmacy Consult for Heparin Indication: chest pain/ACS  No Known Allergies  Patient Measurements: Height: 5\' 5"  (165.1 cm) Weight: 153 lb 10.6 oz (69.7 kg) IBW/kg (Calculated) : 61.5  Vital Signs: Temp: 97.9 F (36.6 C) (09/10 0456) Temp Source: Oral (09/10 0456) BP: 134/63 mmHg (09/10 0926) Pulse Rate: 49 (09/10 0645)  Labs:  Recent Labs  08/01/15 1339  08/02/15 0551 08/02/15 0620 08/02/15 1012 08/02/15 1645 08/02/15 2020 08/03/15 0116 08/04/15 0344 08/04/15 0920  HGB 14.4  --  14.3  --   --   --   --  14.1 14.2  --   HCT 41.9  --  42.2  --   --   --   --  41.2 41.2  --   PLT 187  --  180  --   --   --   --  184 191  --   LABPROT  --   --   --   --   --   --   --  15.4*  --   --   INR  --   --   --   --   --   --   --  1.20  --   --   HEPARINUNFRC  --   < > 0.40 0.27*  --  0.23*  --  0.44  --  0.30  CREATININE 0.99  --  1.00  --   --   --   --   --   --   --   TROPONINI  --   < >  --  1.73* 1.68*  --  1.19*  --   --   --   < > = values in this interval not displayed.  Estimated Creatinine Clearance: 45.3 mL/min (by C-G formula based on Cr of 1).   Assessment: 81 y.o. male on heparin for r/o ACS. Was restarted at ~0130 on 9/10. Plan was to restart earlier, but pt had some bleeding from his TR band that delayed restarting.  Heparin level therapeutic this AM (0.3) on 1150 units/hr. Will plan to recheck in 8hr d/t being at the very end of goal range. CBC stable. No bleeding per nurse.   Goal of Therapy:  Heparin level 0.3-0.7 units/ml Monitor platelets by anticoagulation protocol: Yes   Plan:  Continue heparin at 1150 units/hr Recheck in 8 hr Daily heparin level and CBC CABG likely next week\  Governor Specking, PharmD Clinical Pharmacy Resident Pager: [phone] 08/04/2015,11:02 AM

## 2015-08-04 NOTE — Progress Notes (Signed)
ANTICOAGULATION CONSULT NOTE - Follow Up Consult  Pharmacy Consult for Heparin Indication: chest pain/ACS  No Known Allergies  Patient Measurements: Height: 5\' 5"  (165.1 cm) Weight: 153 lb 10.6 oz (69.7 kg) IBW/kg (Calculated) : 61.5  Vital Signs: Temp: 98.6 F (37 C) (09/10 1920) Temp Source: Oral (09/10 1920) BP: 135/62 mmHg (09/10 1920) Pulse Rate: 57 (09/10 1920)  Labs:  Recent Labs  08/02/15 0551 08/02/15 0620 08/02/15 1012  08/02/15 2020 08/03/15 0116 08/04/15 0344 08/04/15 0920 08/04/15 1910  HGB 14.3  --   --   --   --  14.1 14.2  --   --   HCT 42.2  --   --   --   --  41.2 41.2  --   --   PLT 180  --   --   --   --  184 191  --   --   LABPROT  --   --   --   --   --  15.4*  --   --   --   INR  --   --   --   --   --  1.20  --   --   --   HEPARINUNFRC 0.40 0.27*  --   < >  --  0.44  --  0.30 0.44  CREATININE 1.00  --   --   --   --   --   --   --   --   TROPONINI  --  1.73* 1.68*  --  1.19*  --   --   --   --   < > = values in this interval not displayed.  Estimated Creatinine Clearance: 45.3 mL/min (by C-G formula based on Cr of 1).   Assessment: 81 y.o. male on heparin for r/o ACS. Was restarted at ~0130 on 9/10. Plan was to restart earlier, but pt had some bleeding from his TR band that delayed restarting.  Heparin level therapeutic this evening   Goal of Therapy:  Heparin level 0.3-0.7 units/ml Monitor platelets by anticoagulation protocol: Yes   Plan:  Continue heparin at 1150 units/hr Daily heparin level and CBC CABG likely next week\  Thank you Anette Guarneri, PharmD [phone]  08/04/2015,8:10 PM

## 2015-08-05 LAB — CBC
HCT: 41.1 % (ref 39.0–52.0)
HEMOGLOBIN: 14.1 g/dL (ref 13.0–17.0)
MCH: 31.1 pg (ref 26.0–34.0)
MCHC: 34.3 g/dL (ref 30.0–36.0)
MCV: 90.5 fL (ref 78.0–100.0)
PLATELETS: 189 10*3/uL (ref 150–400)
RBC: 4.54 MIL/uL (ref 4.22–5.81)
RDW: 13.5 % (ref 11.5–15.5)
WBC: 8.7 10*3/uL (ref 4.0–10.5)

## 2015-08-05 LAB — HEPARIN LEVEL (UNFRACTIONATED): HEPARIN UNFRACTIONATED: 0.36 [IU]/mL (ref 0.30–0.70)

## 2015-08-05 MED ORDER — MAGNESIUM HYDROXIDE 400 MG/5ML PO SUSP
30.0000 mL | Freq: Every day | ORAL | Status: DC | PRN
  Administered 2015-08-05: 30 mL via ORAL
  Filled 2015-08-05 (×3): qty 30

## 2015-08-05 NOTE — Progress Notes (Signed)
ANTICOAGULATION CONSULT NOTE - Follow Up Consult  Pharmacy Consult for Heparin Indication: chest pain/ACS  No Known Allergies  Patient Measurements: Height: 5\' 5"  (165.1 cm) Weight: 153 lb 1.6 oz (69.446 kg) IBW/kg (Calculated) : 61.5  Vital Signs: Temp: 97.8 F (36.6 C) (09/11 0520) Temp Source: Oral (09/11 0520) BP: 139/72 mmHg (09/11 0520) Pulse Rate: 62 (09/11 0520)  Labs:  Recent Labs  08/02/15 2020  08/03/15 0116 08/04/15 0344 08/04/15 0920 08/04/15 1910 08/05/15 0350  HGB  --   < > 14.1 14.2  --   --  14.1  HCT  --   --  41.2 41.2  --   --  41.1  PLT  --   --  184 191  --   --  189  LABPROT  --   --  15.4*  --   --   --   --   INR  --   --  1.20  --   --   --   --   HEPARINUNFRC  --   --  0.44  --  0.30 0.44 0.36  TROPONINI 1.19*  --   --   --   --   --   --   < > = values in this interval not displayed.  Estimated Creatinine Clearance: 45.3 mL/min (by C-G formula based on Cr of 1).   Assessment: 81 y.o. male on heparin for r/o ACS. Was restarted at ~0130 on 9/10. Plan was to restart earlier, but pt had some bleeding from his TR band that delayed restarting.  Heparin level therapeutic this morning at 0.36 CBC stable with Hgb 14.1, Plt 189   Goal of Therapy:  Heparin level 0.3-0.7 units/ml Monitor platelets by anticoagulation protocol: Yes   Plan:  Continue heparin at 1150 units/hr Daily heparin level and CBC CABG 9/13  Governor Specking, PharmD Clinical Pharmacy Resident Pager: [phone] 08/05/2015,10:30 AM

## 2015-08-05 NOTE — Progress Notes (Signed)
Patient Name: Isaac [name] Date of Encounter: 08/05/2015     Principal Problem:   Hypertensive urgency Active Problems:   Coronary artery disease due to lipid rich plaque   Old MI (myocardial infarction)   Throat pain   Elevated troponin   NSTEMI (non-ST elevated myocardial infarction)    SUBJECTIVE  The patient feels well.  No chest pain.  He walked in the hall yesterday Telemetry shows normal sinus rhythm with occasional PVCs.  CURRENT MEDS . aspirin EC  81 mg Oral Daily  . donepezil  5 mg Oral QHS  . Influenza vac split quadrivalent PF  0.5 mL Intramuscular Tomorrow-1000  . lisinopril  40 mg Oral Daily  . pravastatin  40 mg Oral QPM  . sodium chloride  3 mL Intravenous Q12H    OBJECTIVE  Filed Vitals:   08/04/15 1400 08/04/15 1920 08/05/15 0115 08/05/15 0520  BP: 131/65 135/62 130/67 139/72  Pulse: 60 57 57 62  Temp: 98.2 F (36.8 C) 98.6 F (37 C) 98.4 F (36.9 C) 97.8 F (36.6 C)  TempSrc: Oral Oral Oral Oral  Resp: 17 18 16 16   Height:      Weight:    153 lb 1.6 oz (69.446 kg)  SpO2: 98% 97% 95% 98%    Intake/Output Summary (Last 24 hours) at 08/05/15 1100 Last data filed at 08/05/15 0758  Gross per 24 hour  Intake   1056 ml  Output    725 ml  Net    331 ml   Filed Weights   08/03/15 0358 08/04/15 0456 08/05/15 0520  Weight: 154 lb 1.6 oz (69.899 kg) 153 lb 10.6 oz (69.7 kg) 153 lb 1.6 oz (69.446 kg)    PHYSICAL EXAM  General: Pleasant, NAD. Neuro: Alert and oriented X 3. Moves all extremities spontaneously. Psych: Normal affect. HEENT:  Normal  Neck: Supple without bruits or JVD. Lungs:  Resp regular and unlabored, CTA. Heart: RRR no s3, s4, or murmurs. Abdomen: Soft, non-tender, non-distended, BS + x 4.  Extremities: No clubbing, cyanosis or edema. DP/PT/Radials 2+ and equal bilaterally.  Accessory Clinical Findings  CBC  Recent Labs  08/04/15 0344 08/05/15 0350  WBC 9.2 8.7  HGB 14.2 14.1  HCT 41.2 41.1  MCV 90.5 90.5    PLT 191 834   Basic Metabolic Panel No results for input(s): NA, K, CL, CO2, GLUCOSE, BUN, CREATININE, CALCIUM, MG, PHOS in the last 72 hours. Liver Function Tests No results for input(s): AST, ALT, ALKPHOS, BILITOT, PROT, ALBUMIN in the last 72 hours. No results for input(s): LIPASE, AMYLASE in the last 72 hours. Cardiac Enzymes  Recent Labs  08/02/15 2020  TROPONINI 1.19*   BNP Invalid input(s): POCBNP D-Dimer No results for input(s): DDIMER in the last 72 hours. Hemoglobin A1C No results for input(s): HGBA1C in the last 72 hours. Fasting Lipid Panel No results for input(s): CHOL, HDL, LDLCALC, TRIG, CHOLHDL, LDLDIRECT in the last 72 hours. Thyroid Function Tests No results for input(s): TSH, T4TOTAL, T3FREE, THYROIDAB in the last 72 hours.  Invalid input(s): FREET3  TELE  Sinus bradycardia with occasional PVCs  ECG    Radiology/Studies  Dg Chest 2 View  08/01/2015   CLINICAL DATA:  Chest pain, question aspiration while swimming today.  EXAM: CHEST  2 VIEW  COMPARISON:  Apr 04, 2014  FINDINGS: The heart size and mediastinal contours are stable. The heart size enlarged. There is coarse mild increased pulmonary interstitium in the left lower lobe unchanged compared to prior  exam. There is no focal pneumonia, pulmonary edema, or pleural effusion. The visualized skeletal structures are stable.  IMPRESSION: No active cardiopulmonary disease. Chronic changes of the left lower lobe.   Electronically Signed   By: Abelardo Diesel M.D.   On: 08/01/2015 14:02    ASSESSMENT AND PLAN 1. Non-STEMI 2. Essential hypertension.  Blood pressure stable on current therapy 3.  PVCs, asymptomatic  Plan: Continue IV heparin. CABG planned for Tuesday. Continue to ambulate as tolerated Signed, Darlin Coco MD

## 2015-08-05 NOTE — Care Management Important Message (Signed)
Important Message  Patient Details  Name: Isaac [name] MRN: [id number] Date of Birth: [birth year]   Medicare Important Message Given:  Yes-second notification given    Apolonio Schneiders, RN 08/05/2015, 12:30 PM

## 2015-08-06 ENCOUNTER — Inpatient Hospital Stay (HOSPITAL_COMMUNITY): Payer: Medicare Other

## 2015-08-06 LAB — PULMONARY FUNCTION TEST
DL/VA % pred: 70 %
DL/VA: 3.18 ml/min/mmHg/L
DLCO cor % pred: 48 %
DLCO cor: 15.34 ml/min/mmHg
DLCO unc % pred: 48 %
DLCO unc: 15.25 ml/min/mmHg
FEF 25-75 Post: 2.36 L/sec
FEF 25-75 Pre: 2.19 L/sec
FEF2575-%Change-Post: 7 %
FEF2575-%Pred-Post: 150 %
FEF2575-%Pred-Pre: 139 %
FEV1-%Change-Post: 1 %
FEV1-%Pred-Post: 117 %
FEV1-%Pred-Pre: 115 %
FEV1-Post: 2.95 L
FEV1-Pre: 2.89 L
FEV1FVC-%Change-Post: 3 %
FEV1FVC-%Pred-Pre: 106 %
FEV6-%Change-Post: 0 %
FEV6-%Pred-Post: 113 %
FEV6-%Pred-Pre: 112 %
FEV6-Post: 3.8 L
FEV6-Pre: 3.78 L
FEV6FVC-%Change-Post: 2 %
FEV6FVC-%Pred-Post: 108 %
FEV6FVC-%Pred-Pre: 105 %
FVC-%Change-Post: -1 %
FVC-%Pred-Post: 105 %
FVC-%Pred-Pre: 107 %
FVC-Post: 3.83 L
FVC-Pre: 3.89 L
Post FEV1/FVC ratio: 77 %
Post FEV6/FVC ratio: 100 %
Pre FEV1/FVC ratio: 74 %
Pre FEV6/FVC Ratio: 97 %
RV % pred: 58 %
RV: 1.64 L
TLC % pred: 80 %
TLC: 5.64 L

## 2015-08-06 LAB — ABO/RH: ABO/RH(D): O POS

## 2015-08-06 LAB — BASIC METABOLIC PANEL
Anion gap: 6 (ref 5–15)
BUN: 16 mg/dL (ref 6–20)
CHLORIDE: 102 mmol/L (ref 101–111)
CO2: 29 mmol/L (ref 22–32)
CREATININE: 1.1 mg/dL (ref 0.61–1.24)
Calcium: 9.3 mg/dL (ref 8.9–10.3)
GFR calc non Af Amer: 58 mL/min — ABNORMAL LOW (ref >60)
Glucose, Bld: 94 mg/dL (ref 65–99)
POTASSIUM: 3.9 mmol/L (ref 3.5–5.1)
Sodium: 137 mmol/L (ref 135–145)

## 2015-08-06 LAB — MRSA PCR SCREENING: MRSA BY PCR: NEGATIVE

## 2015-08-06 LAB — CBC
HEMATOCRIT: 42.5 % (ref 39.0–52.0)
HEMOGLOBIN: 14.4 g/dL (ref 13.0–17.0)
MCH: 30.6 pg (ref 26.0–34.0)
MCHC: 33.9 g/dL (ref 30.0–36.0)
MCV: 90.4 fL (ref 78.0–100.0)
Platelets: 195 10*3/uL (ref 150–400)
RBC: 4.7 MIL/uL (ref 4.22–5.81)
RDW: 13.4 % (ref 11.5–15.5)
WBC: 8 10*3/uL (ref 4.0–10.5)

## 2015-08-06 LAB — TYPE AND SCREEN
ABO/RH(D): O POS
Antibody Screen: NEGATIVE

## 2015-08-06 LAB — HEPARIN LEVEL (UNFRACTIONATED): Heparin Unfractionated: 0.64 IU/mL (ref 0.30–0.70)

## 2015-08-06 LAB — SURGICAL PCR SCREEN
MRSA, PCR: NEGATIVE
Staphylococcus aureus: NEGATIVE

## 2015-08-06 MED ORDER — ALBUTEROL SULFATE (2.5 MG/3ML) 0.083% IN NEBU
2.5000 mg | INHALATION_SOLUTION | Freq: Once | RESPIRATORY_TRACT | Status: AC
  Administered 2015-08-06: 2.5 mg via RESPIRATORY_TRACT

## 2015-08-06 MED ORDER — TEMAZEPAM 15 MG PO CAPS: 15.0000 mg | ORAL_CAPSULE | Freq: Once | ORAL | Status: DC | PRN

## 2015-08-06 MED ORDER — CHLORHEXIDINE GLUCONATE 4 % EX LIQD
60.0000 mL | Freq: Once | CUTANEOUS | Status: AC
  Administered 2015-08-07: 4 via TOPICAL
  Filled 2015-08-06: qty 60

## 2015-08-06 MED ORDER — DOPAMINE-DEXTROSE 3.2-5 MG/ML-% IV SOLN
0.0000 ug/kg/min | INTRAVENOUS | Status: DC
  Filled 2015-08-06: qty 250

## 2015-08-06 MED ORDER — EPINEPHRINE HCL 1 MG/ML IJ SOLN
0.0000 ug/min | INTRAVENOUS | Status: DC
  Filled 2015-08-06: qty 4

## 2015-08-06 MED ORDER — NITROGLYCERIN IN D5W 200-5 MCG/ML-% IV SOLN
2.0000 ug/min | INTRAVENOUS | Status: AC
  Administered 2015-08-07: 5 ug/min via INTRAVENOUS
  Filled 2015-08-06: qty 250

## 2015-08-06 MED ORDER — DEXTROSE 5 % IV SOLN
1.5000 g | INTRAVENOUS | Status: AC
  Administered 2015-08-07: 1.5 g via INTRAVENOUS
  Administered 2015-08-07: .75 g via INTRAVENOUS
  Filled 2015-08-06: qty 1.5

## 2015-08-06 MED ORDER — SODIUM CHLORIDE 0.9 % IV SOLN
INTRAVENOUS | Status: AC
  Administered 2015-08-07: .7 [IU]/h via INTRAVENOUS
  Filled 2015-08-06: qty 2.5

## 2015-08-06 MED ORDER — PLASMA-LYTE 148 IV SOLN
INTRAVENOUS | Status: AC
  Administered 2015-08-07: 500 mL
  Filled 2015-08-06: qty 2.5

## 2015-08-06 MED ORDER — PHENYLEPHRINE HCL 10 MG/ML IJ SOLN
30.0000 ug/min | INTRAVENOUS | Status: AC
  Administered 2015-08-07: 20 ug/min via INTRAVENOUS
  Filled 2015-08-06: qty 2

## 2015-08-06 MED ORDER — DEXMEDETOMIDINE HCL IN NACL 400 MCG/100ML IV SOLN
0.1000 ug/kg/h | INTRAVENOUS | Status: AC
  Administered 2015-08-07: .3 ug/kg/h via INTRAVENOUS
  Filled 2015-08-06: qty 100

## 2015-08-06 MED ORDER — POTASSIUM CHLORIDE 2 MEQ/ML IV SOLN
80.0000 meq | INTRAVENOUS | Status: DC
  Filled 2015-08-06: qty 40

## 2015-08-06 MED ORDER — METOPROLOL TARTRATE 12.5 MG HALF TABLET
12.5000 mg | ORAL_TABLET | Freq: Once | ORAL | Status: AC
  Administered 2015-08-07: 12.5 mg via ORAL
  Filled 2015-08-06: qty 1

## 2015-08-06 MED ORDER — BISACODYL 5 MG PO TBEC
5.0000 mg | DELAYED_RELEASE_TABLET | Freq: Once | ORAL | Status: AC
  Administered 2015-08-06: 5 mg via ORAL
  Filled 2015-08-06: qty 1

## 2015-08-06 MED ORDER — SODIUM CHLORIDE 0.9 % IV SOLN
INTRAVENOUS | Status: DC
  Filled 2015-08-06: qty 30

## 2015-08-06 MED ORDER — DEXTROSE 5 % IV SOLN
750.0000 mg | INTRAVENOUS | Status: DC
  Filled 2015-08-06: qty 750

## 2015-08-06 MED ORDER — SODIUM CHLORIDE 0.9 % IV SOLN
1250.0000 mg | INTRAVENOUS | Status: AC
  Administered 2015-08-07: 1250 mg via INTRAVENOUS
  Filled 2015-08-06 (×2): qty 1250

## 2015-08-06 MED ORDER — CHLORHEXIDINE GLUCONATE 4 % EX LIQD
60.0000 mL | Freq: Once | CUTANEOUS | Status: AC
  Administered 2015-08-06: 4 via TOPICAL
  Filled 2015-08-06: qty 60

## 2015-08-06 MED ORDER — MAGNESIUM SULFATE 50 % IJ SOLN
40.0000 meq | INTRAMUSCULAR | Status: DC
  Filled 2015-08-06: qty 10

## 2015-08-06 MED ORDER — SODIUM CHLORIDE 0.9 % IV SOLN
INTRAVENOUS | Status: AC
  Administered 2015-08-07: 69.8 mL/h via INTRAVENOUS
  Filled 2015-08-06: qty 40

## 2015-08-06 NOTE — Progress Notes (Signed)
Patient ID: Isaac [name], male   DOB: 1925/12/16, 81 y.o.   MRN: [id number]      [address]       Isaac [name],Isaac [name]             [phone]                 3 Days Post-Op Procedure(s) (LRB): Left Heart Cath and Coronary Angiography (N/A) Intravascular Pressure Wire/FFR Study  LOS: 4 days   Subjective: No complaints today  Objective: Vital signs in last 24 hours: Patient Vitals for the past 24 hrs:  BP Temp Temp src Pulse Resp SpO2 Weight  08/06/15 1430 (!) 116/50 mmHg 98.1 F (36.7 C) Oral 61 20 94 % -  08/06/15 1000 (!) 138/56 mmHg - - (!) 55 - - -  08/06/15 0415 (!) 149/64 mmHg 97.8 F (36.6 C) Oral (!) 55 18 98 % 153 lb 14.1 oz (69.8 kg)  08/05/15 2056 (!) 142/66 mmHg 97.8 F (36.6 C) Oral (!) 56 - 98 % -    Filed Weights   08/04/15 0456 08/05/15 0520 08/06/15 0415  Weight: 153 lb 10.6 oz (69.7 kg) 153 lb 1.6 oz (69.446 kg) 153 lb 14.1 oz (69.8 kg)    Hemodynamic parameters for last 24 hours:    Intake/Output from previous day: 09/11 0701 - 09/12 0700 In: 500 [P.O.:500] Out: 1180 [Urine:1180] Intake/Output this shift: Total I/O In: 1075 [P.O.:730; I.V.:345] Out: 400 [Urine:400]  Scheduled Meds: . [START ON 08/07/2015] aminocaproic acid (AMICAR) for OHS   Intravenous To OR  . aspirin EC  81 mg Oral Daily  . [START ON 08/07/2015] cefUROXime (ZINACEF)  IV  1.5 g Intravenous To OR  . [START ON 08/07/2015] cefUROXime (ZINACEF)  IV  750 mg Intravenous To OR  . chlorhexidine  60 mL Topical Once   And  . [START ON 08/07/2015] chlorhexidine  60 mL Topical Once  . [START ON 08/07/2015] dexmedetomidine  0.1-0.7 mcg/kg/hr Intravenous To OR  . donepezil  5 mg Oral QHS  . [START ON 08/07/2015] DOPamine  0-10 mcg/kg/min Intravenous To OR  . [START ON 08/07/2015] epinephrine  0-10 mcg/min Intravenous To OR  . [START ON 08/07/2015] heparin-papaverine-plasmalyte irrigation   Irrigation To OR  . [START ON 08/07/2015] heparin 30,000 units/NS 1000 mL solution for  CELLSAVER   Other To OR  . Influenza vac split quadrivalent PF  0.5 mL Intramuscular Tomorrow-1000  . [START ON 08/07/2015] insulin (NOVOLIN-R) infusion   Intravenous To OR  . lisinopril  40 mg Oral Daily  . [START ON 08/07/2015] magnesium sulfate  40 mEq Other To OR  . [START ON 08/07/2015] metoprolol tartrate  12.5 mg Oral Once  . [START ON 08/07/2015] nitroGLYCERIN  2-200 mcg/min Intravenous To OR  . [START ON 08/07/2015] phenylephrine (NEO-SYNEPHRINE) Adult infusion  30-200 mcg/min Intravenous To OR  . [START ON 08/07/2015] potassium chloride  80 mEq Other To OR  . pravastatin  40 mg Oral QPM  . sodium chloride  3 mL Intravenous Q12H  . [START ON 08/07/2015] vancomycin  1,250 mg Intravenous To OR   Continuous Infusions: . heparin 1,150 Units/hr (08/06/15 1200)   PRN Meds:.sodium chloride, acetaminophen, magnesium hydroxide, nitroGLYCERIN, ondansetron (ZOFRAN) IV, sodium chloride, temazepam  General appearance: alert and cooperative Neurologic: intact Heart: regular rate and rhythm, S1, S2 normal, no murmur, click, rub or gallop Lungs: clear to auscultation bilaterally Abdomen: soft, non-tender; bowel sounds normal; no masses,  no organomegaly Extremities: extremities normal, atraumatic, no  cyanosis or edema and Homans sign is negative, no sign of DVT  Lab Results: CBC: Recent Labs  08/05/15 0350 08/06/15 0408  WBC 8.7 8.0  HGB 14.1 14.4  HCT 41.1 42.5  PLT 189 195   BMET:  Recent Labs  08/06/15 0408  NA 137  K 3.9  CL 102  CO2 29  GLUCOSE 94  BUN 16  CREATININE 1.10  CALCIUM 9.3    PT/INR: No results for input(s): LABPROT, INR in the last 72 hours.   Radiology Dg Chest 2 View  08/01/2015   CLINICAL DATA:  Chest pain, question aspiration while swimming today.  EXAM: CHEST  2 VIEW  COMPARISON:  Apr 04, 2014  FINDINGS: The heart size and mediastinal contours are stable. The heart size enlarged. There is coarse mild increased pulmonary interstitium in the left lower lobe  unchanged compared to prior exam. There is no focal pneumonia, pulmonary edema, or pleural effusion. The visualized skeletal structures are stable.  IMPRESSION: No active cardiopulmonary disease. Chronic changes of the left lower lobe.   Electronically Signed   By: Isaac [name] M.D.   On: 08/01/2015 14:02       Assessment/Plan: S/P Procedure(s) (LRB): Left Heart Cath and Coronary Angiography (N/A) Intravascular Pressure Wire/FFR Study Plan CABG tomorrow.  Risks and options again discussed with patient and wife  The goals risks and alternatives of the planned surgical procedure  CABG have been discussed with the patient in detail. The risks of the procedure including death, infection, stroke, myocardial infarction, bleeding, blood transfusion have all been discussed specifically.  I have quoted Isaac [name] a 4% of perioperative mortality and a complication rate as high as 40 %. The patient's questions have been answered.Isaac [name] is willing  to proceed with the planned procedure.   [name] MD 08/06/2015 3:25 PM

## 2015-08-06 NOTE — Progress Notes (Signed)
Pt has been walking. Discussed sternal precautions, IS, mobility post op and d/c planning with pt and wife. They will be interested in SNF at [facility]. Will need PT eval post op. Gave OHS book, guideline and video to watch.  [place]mire, ACSM 2:43 PM 08/06/2015

## 2015-08-06 NOTE — Progress Notes (Signed)
ANTICOAGULATION CONSULT NOTE - Follow Up Consult  Pharmacy Consult for Heparin Indication: severe 2-vessel CAD. for OHS 0n 9/13 pm  No Known Allergies  Patient Measurements: Height: 5\' 5"  (165.1 cm) Weight: 153 lb 14.1 oz (69.8 kg) IBW/kg (Calculated) : 61.5 Heparin Dosing Weight: 69.8 kg  Vital Signs: Temp: 97.8 F (36.6 C) (09/12 0415) Temp Source: Oral (09/12 0415) BP: 138/56 mmHg (09/12 1000) Pulse Rate: 55 (09/12 1000)  Labs:  Recent Labs  08/04/15 0344  08/04/15 1910 08/05/15 0350 08/06/15 0408  HGB 14.2  --   --  14.1 14.4  HCT 41.2  --   --  41.1 42.5  PLT 191  --   --  189 195  HEPARINUNFRC  --   < > 0.44 0.36 0.64  CREATININE  --   --   --   --  1.10  < > = values in this interval not displayed.  Estimated Creatinine Clearance: 41.2 mL/min (by C-G formula based on Cr of 1.1).  Assessment:   Heparin level remains therapeutic (0.64) on 1150 units/hr. CBC stable.   For OHS on 9/13 at ~ 12:45pm.  Goal of Therapy:  Heparin level 0.3-0.7 units/ml Monitor platelets by anticoagulation protocol: Yes   Plan:   Continue heparin drip at 1150 units/hr.  Heparin level and CBC in am.  Heparin to stop on call to OR on 9/13.  Arty Baumgartner, [place] Pager: [phone] 08/06/2015,11:39 AM

## 2015-08-06 NOTE — Progress Notes (Signed)
2200 Pt ambulated in halls with supervision assist.  Pt denied any complaints of pain or dizziness.  Pt assisted back to bed with bed alarm on.  Will continue to monitor.

## 2015-08-06 NOTE — Progress Notes (Signed)
pt had 2.22 second pause. asymtomatic will continue to monitor. MD notified

## 2015-08-06 NOTE — Progress Notes (Signed)
Central notified. Pt had a 2.6 second pause. Asymtomatic.  will continue to monitor.

## 2015-08-06 NOTE — Clinical Social Work Note (Signed)
Clinical Social Work Assessment  Patient Details  Name: Isaac [name] MRN: [id number] Date of Birth: 01/29/1926  Date of referral:  08/06/15               Reason for consult:  Facility Placement                Permission sought to share information with:  Chartered certified accountant granted to share information::  Yes, Verbal Permission Granted  Name::        Agency::   ([facility])  Relationship::     Contact Information:     Housing/Transportation Living arrangements for the past 2 months:  [place] of Information:  Patient, Spouse Patient Interpreter Needed:  None Criminal Activity/Legal Involvement Pertinent to Current Situation/Hospitalization:  No - Comment as needed Significant Relationships:  Spouse Lives with:  Facility Resident, Spouse Do you feel safe going back to the place where you live?  Yes Need for family participation in patient care:  No (Coment)  Care giving concerns:  Pt with plans for CABG this week and need for potential higher level of care at [facility] assessment / plan:  CSW spoke with pt and pt wife at bedside. Pt confirms he was admitted from [facility]. Pt wife informed CSW she has already spoken with facility and provided update on pt care. They are aware that pt will likely be best suited to return to facility skilled level at dc. CSW explained role and notified pt wife that CSW will take care of communicating with facility from this point on. Pt wife thankful for information. Pt and pt wife aware that there is potential that [facility] will not have SNF bed available and are agreeable for pt to dc to [facility] SNF if needed.  Employment status:  Retired Forensic scientist:  Medicare PT Recommendations:  Not assessed at this time Information / Referral to community resources:   (None needed. Pt from facility with multiple levels of care and aware of  information)  Patient/Family's Response to care:  Pt agreeable to SNF at dc.  Patient/Family's Understanding of and Emotional Response to Diagnosis, Current Treatment, and Prognosis:  Pt has good insight on his condition and treatment. Pt with appropriate emotional response and hopeful for easy recovery.   Emotional Assessment Appearance:  Appears stated age, Well-Groomed Attitude/Demeanor/Rapport:  Other (Cooperative) Affect (typically observed):  Pleasant, Happy Orientation:  Oriented to Self, Oriented to Place, Oriented to  Time, Oriented to Situation Alcohol / Substance use:  Not Applicable Psych involvement (Current and /or in the community):  No (Comment)  Discharge Needs  Concerns to be addressed:  Discharge Planning Concerns Readmission within the last 30 days:  No Current discharge risk:  Chronically ill Barriers to Discharge:  Continued Medical Work up   [employer], [place]

## 2015-08-06 NOTE — Progress Notes (Signed)
Patient Name: Isaac [name] Date of Encounter: 08/06/2015  Principal Problem:   Hypertensive urgency Active Problems:   Coronary artery disease due to lipid rich plaque   Old MI (myocardial infarction)   Throat pain   Elevated troponin   NSTEMI (non-ST elevated myocardial infarction)  SUBJECTIVE  Feeling well. Denies chest pain, sob or palpitation. Ambulated without difficulty. For CABG tomorrow.   CURRENT MEDS . aspirin EC  81 mg Oral Daily  . donepezil  5 mg Oral QHS  . Influenza vac split quadrivalent PF  0.5 mL Intramuscular Tomorrow-1000  . lisinopril  40 mg Oral Daily  . pravastatin  40 mg Oral QPM  . sodium chloride  3 mL Intravenous Q12H    OBJECTIVE  Filed Vitals:   08/05/15 0520 08/05/15 1345 08/05/15 2056 08/06/15 0415  BP: 139/72 112/89 142/66 149/64  Pulse: 62 78 56 55  Temp: 97.8 F (36.6 C) 98 F (36.7 C) 97.8 F (36.6 C) 97.8 F (36.6 C)  TempSrc: Oral Oral Oral Oral  Resp: 16 20  18   Height:      Weight: 153 lb 1.6 oz (69.446 kg)   153 lb 14.1 oz (69.8 kg)  SpO2: 98% 95% 98% 98%    Intake/Output Summary (Last 24 hours) at 08/06/15 0824 Last data filed at 08/06/15 0247  Gross per 24 hour  Intake    250 ml  Output   1180 ml  Net   -930 ml   Filed Weights   08/04/15 0456 08/05/15 0520 08/06/15 0415  Weight: 153 lb 10.6 oz (69.7 kg) 153 lb 1.6 oz (69.446 kg) 153 lb 14.1 oz (69.8 kg)    PHYSICAL EXAM  General: Pleasant, NAD. Neuro: Alert and oriented X 3. Moves all extremities spontaneously. Psych: Normal affect. HEENT:  Normal  Neck: Supple without bruits or JVD. Lungs:  Resp regular and unlabored, CTA. Heart: RRR no s3, s4, or murmurs. Abdomen: Soft, non-tender, non-distended, BS + x 4.  Extremities: No clubbing, cyanosis or edema. DP/PT/Radials 2+ and equal bilaterally.  Accessory Clinical Findings  CBC  Recent Labs  08/05/15 0350 08/06/15 0408  WBC 8.7 8.0  HGB 14.1 14.4  HCT 41.1 42.5  MCV 90.5 90.4  PLT 189 179    Basic Metabolic Panel  Recent Labs  08/06/15 0408  NA 137  K 3.9  CL 102  CO2 29  GLUCOSE 94  BUN 16  CREATININE 1.10  CALCIUM 9.3    TELE  NSR 40-60s with Frequent PVCs.   Radiology/Studies  Dg Chest 2 View  08/01/2015   CLINICAL DATA:  Chest pain, question aspiration while swimming today.  EXAM: CHEST  2 VIEW  COMPARISON:  Apr 04, 2014  FINDINGS: The heart size and mediastinal contours are stable. The heart size enlarged. There is coarse mild increased pulmonary interstitium in the left lower lobe unchanged compared to prior exam. There is no focal pneumonia, pulmonary edema, or pleural effusion. The visualized skeletal structures are stable.  IMPRESSION: No active cardiopulmonary disease. Chronic changes of the left lower lobe.   Electronically Signed   By: Abelardo Diesel M.D.   On: 08/01/2015 14:02    ASSESSMENT AND PLAN   1. NSTEMI - Peak of trop was 1.73 - Most recent cath 08/03/15 - Severe 2 vessel obstructive CAD: Ostial LAD stenosis is moderate with significantly abnormal FFR of 0.75, critical proximal LCx stenosis with tandem lesions involving take off of large OM branch, patent stent in mid RCA - For CABG tomorrow  2. CAD -  [place], [place] - Cath 11/09/2012 showed proximal to mid circumflex 95% stenosed, unable to perform PCI due to chronic. Unsure PLV patient. Doubt rotablator option. CTA or recath RCA. CABG may be best options.  - CT CTA of coronary showed patent previous stent in proximal-mid, mid and distal RCA. Unknown type.  - Echo 11/2004 - LV EF of 50-60%, mild mitral regur and trace tricuspid regur - As above  3. HTN -Relatively stable  4. Sinus Bradycardia - Discontinued Troprol.   5. PVCs - Asymptomatic. Unable to add BB.   Jarrett Soho PA-C Pager [phone]

## 2015-08-06 NOTE — Clinical Social Work Placement (Addendum)
   CLINICAL SOCIAL WORK PLACEMENT  NOTE  Date:  08/06/2015  Patient Details  Name: Isaac [name] MRN: [id number] Date of Birth: [birth year]  Clinical Social Work is seeking post-discharge placement for this patient at the [place] level of care (*CSW will initial, date and re-position this form in  chart as items are completed):  Yes   Patient/family provided with [place] Work Department's list of facilities offering this level of care within the geographic area requested by the patient (or if unable, by the patient's family).  Yes   Patient/family informed of their freedom to choose among providers that offer the needed level of care, that participate in Medicare, Medicaid or managed care program needed by the patient, have an available bed and are willing to accept the patient.  Yes   Patient/family informed of [facility]'s ownership interest in [facility] and [facility], as well as of the fact that they are under no obligation to receive care at these facilities.  PASRR submitted to EDS on 08/06/15     PASRR number received on 08/06/15     Existing PASRR number confirmed on       FL2 transmitted to all facilities in geographic area requested by pt/family on 08/06/15     FL2 transmitted to all facilities within larger geographic area on       Patient informed that his/her managed care company has contracts with or will negotiate with certain facilities, including the following:            Patient/family informed of bed offers received.  Patient chooses bed at       Physician recommends and patient chooses bed at      Patient to be transferred to   [facility] SNF unit #17 on  .08/11/2015   Patient to be transferred to facility by       Patient family notified on   08/11/2015 of transfer.  Name of family member notified:    wife- [name] wife's car  PHYSICIAN     Additional Comment:    Eduard Clos, MSW,  [place]

## 2015-08-07 ENCOUNTER — Inpatient Hospital Stay (HOSPITAL_COMMUNITY): Payer: Medicare Other

## 2015-08-07 ENCOUNTER — Inpatient Hospital Stay (HOSPITAL_COMMUNITY): Payer: Medicare Other | Admitting: Anesthesiology

## 2015-08-07 ENCOUNTER — Encounter (HOSPITAL_COMMUNITY): Payer: Self-pay | Admitting: Certified Registered Nurse Anesthetist

## 2015-08-07 ENCOUNTER — Encounter (HOSPITAL_COMMUNITY)
Admission: EM | Disposition: A | Discharge status: Skilled Nursing Facility | Payer: Medicare Other | Source: Home / Self Care | Attending: Cardiology

## 2015-08-07 DIAGNOSIS — I2510 Atherosclerotic heart disease of native coronary artery without angina pectoris: Secondary | ICD-10-CM | POA: Diagnosis present

## 2015-08-07 HISTORY — PX: CORONARY ARTERY BYPASS GRAFT: SHX141

## 2015-08-07 HISTORY — PX: TEE WITHOUT CARDIOVERSION: SHX5443

## 2015-08-07 LAB — POCT I-STAT 3, ART BLOOD GAS (G3+)
ACID-BASE DEFICIT: 2 mmol/L (ref 0.0–2.0)
ACID-BASE DEFICIT: 5 mmol/L — AB (ref 0.0–2.0)
ACID-BASE DEFICIT: 6 mmol/L — AB (ref 0.0–2.0)
ACID-BASE EXCESS: 3 mmol/L — AB (ref 0.0–2.0)
BICARBONATE: 27.9 meq/L — AB (ref 20.0–24.0)
Bicarbonate: 18.5 mEq/L — ABNORMAL LOW (ref 20.0–24.0)
Bicarbonate: 19.5 mEq/L — ABNORMAL LOW (ref 20.0–24.0)
Bicarbonate: 21.6 mEq/L (ref 20.0–24.0)
O2 SAT: 100 %
O2 SAT: 95 %
O2 Saturation: 95 %
O2 Saturation: 96 %
PCO2 ART: 35 mmHg (ref 35.0–45.0)
PH ART: 7.371 (ref 7.350–7.450)
PO2 ART: 82 mmHg (ref 80.0–100.0)
Patient temperature: 37.6
TCO2: 20 mmol/L (ref 0–100)
TCO2: 21 mmol/L (ref 0–100)
TCO2: 23 mmol/L (ref 0–100)
TCO2: 29 mmol/L (ref 0–100)
pCO2 arterial: 41.1 mmHg (ref 35.0–45.0)
pCO2 arterial: 30.6 mmHg — ABNORMAL LOW (ref 35.0–45.0)
pCO2 arterial: 33.9 mmHg — ABNORMAL LOW (ref 35.0–45.0)
pH, Arterial: 7.335 — ABNORMAL LOW (ref 7.350–7.450)
pH, Arterial: 7.44 (ref 7.350–7.450)
pH, Arterial: 7.453 — ABNORMAL HIGH (ref 7.350–7.450)
pO2, Arterial: 417 mmHg — ABNORMAL HIGH (ref 80.0–100.0)
pO2, Arterial: 69 mmHg — ABNORMAL LOW (ref 80.0–100.0)
pO2, Arterial: 89 mmHg (ref 80.0–100.0)

## 2015-08-07 LAB — CBC
HCT: 40.9 % (ref 39.0–52.0)
HCT: 35.7 % — ABNORMAL LOW (ref 39.0–52.0)
HEMATOCRIT: 33.4 % — AB (ref 39.0–52.0)
Hemoglobin: 14 g/dL (ref 13.0–17.0)
Hemoglobin: 11.4 g/dL — ABNORMAL LOW (ref 13.0–17.0)
Hemoglobin: 12 g/dL — ABNORMAL LOW (ref 13.0–17.0)
MCH: 31.3 pg (ref 26.0–34.0)
MCH: 30.7 pg (ref 26.0–34.0)
MCH: 30.4 pg (ref 26.0–34.0)
MCHC: 34.2 g/dL (ref 30.0–36.0)
MCHC: 34.1 g/dL (ref 30.0–36.0)
MCHC: 33.6 g/dL (ref 30.0–36.0)
MCV: 91.3 fL (ref 78.0–100.0)
MCV: 90 fL (ref 78.0–100.0)
MCV: 90.4 fL (ref 78.0–100.0)
PLATELETS: 102 10*3/uL — AB (ref 150–400)
Platelets: 180 K/uL (ref 150–400)
Platelets: 126 10*3/uL — ABNORMAL LOW (ref 150–400)
RBC: 4.48 MIL/uL (ref 4.22–5.81)
RBC: 3.71 MIL/uL — ABNORMAL LOW (ref 4.22–5.81)
RBC: 3.95 MIL/uL — ABNORMAL LOW (ref 4.22–5.81)
RDW: 13.6 % (ref 11.5–15.5)
RDW: 13.5 % (ref 11.5–15.5)
RDW: 13.4 % (ref 11.5–15.5)
WBC: 8.1 K/uL (ref 4.0–10.5)
WBC: 13.6 10*3/uL — ABNORMAL HIGH (ref 4.0–10.5)
WBC: 19.4 10*3/uL — ABNORMAL HIGH (ref 4.0–10.5)

## 2015-08-07 LAB — POCT I-STAT, CHEM 8
BUN: 16 mg/dL (ref 6–20)
BUN: 15 mg/dL (ref 6–20)
BUN: 13 mg/dL (ref 6–20)
BUN: 14 mg/dL (ref 6–20)
BUN: 14 mg/dL (ref 6–20)
BUN: 13 mg/dL (ref 6–20)
CALCIUM ION: 1.03 mmol/L — AB (ref 1.13–1.30)
CALCIUM ION: 1.03 mmol/L — AB (ref 1.13–1.30)
CALCIUM ION: 1.11 mmol/L — AB (ref 1.13–1.30)
CHLORIDE: 102 mmol/L (ref 101–111)
CHLORIDE: 102 mmol/L (ref 101–111)
CREATININE: 0.8 mg/dL (ref 0.61–1.24)
CREATININE: 0.7 mg/dL (ref 0.61–1.24)
CREATININE: 0.6 mg/dL — AB (ref 0.61–1.24)
CREATININE: 0.6 mg/dL — AB (ref 0.61–1.24)
CREATININE: 0.7 mg/dL (ref 0.61–1.24)
Calcium, Ion: 1.25 mmol/L (ref 1.13–1.30)
Calcium, Ion: 1.23 mmol/L (ref 1.13–1.30)
Calcium, Ion: 1.13 mmol/L (ref 1.13–1.30)
Chloride: 99 mmol/L — ABNORMAL LOW (ref 101–111)
Chloride: 99 mmol/L — ABNORMAL LOW (ref 101–111)
Chloride: 100 mmol/L — ABNORMAL LOW (ref 101–111)
Chloride: 104 mmol/L (ref 101–111)
Creatinine, Ser: 0.8 mg/dL (ref 0.61–1.24)
GLUCOSE: 108 mg/dL — AB (ref 65–99)
GLUCOSE: 104 mg/dL — AB (ref 65–99)
GLUCOSE: 94 mg/dL (ref 65–99)
GLUCOSE: 123 mg/dL — AB (ref 65–99)
Glucose, Bld: 93 mg/dL (ref 65–99)
Glucose, Bld: 179 mg/dL — ABNORMAL HIGH (ref 65–99)
HCT: 33 % — ABNORMAL LOW (ref 39.0–52.0)
HCT: 25 % — ABNORMAL LOW (ref 39.0–52.0)
HCT: 26 % — ABNORMAL LOW (ref 39.0–52.0)
HCT: 24 % — ABNORMAL LOW (ref 39.0–52.0)
HEMATOCRIT: 39 % (ref 39.0–52.0)
HEMATOCRIT: 33 % — AB (ref 39.0–52.0)
HEMOGLOBIN: 11.2 g/dL — AB (ref 13.0–17.0)
HEMOGLOBIN: 8.8 g/dL — AB (ref 13.0–17.0)
HEMOGLOBIN: 8.2 g/dL — AB (ref 13.0–17.0)
HEMOGLOBIN: 11.2 g/dL — AB (ref 13.0–17.0)
Hemoglobin: 13.3 g/dL (ref 13.0–17.0)
Hemoglobin: 8.5 g/dL — ABNORMAL LOW (ref 13.0–17.0)
POTASSIUM: 4.2 mmol/L (ref 3.5–5.1)
POTASSIUM: 3.9 mmol/L (ref 3.5–5.1)
Potassium: 4.1 mmol/L (ref 3.5–5.1)
Potassium: 3.7 mmol/L (ref 3.5–5.1)
Potassium: 4.4 mmol/L (ref 3.5–5.1)
Potassium: 4.5 mmol/L (ref 3.5–5.1)
SODIUM: 137 mmol/L (ref 135–145)
Sodium: 138 mmol/L (ref 135–145)
Sodium: 136 mmol/L (ref 135–145)
Sodium: 134 mmol/L — ABNORMAL LOW (ref 135–145)
Sodium: 140 mmol/L (ref 135–145)
Sodium: 137 mmol/L (ref 135–145)
TCO2: 25 mmol/L (ref 0–100)
TCO2: 27 mmol/L (ref 0–100)
TCO2: 24 mmol/L (ref 0–100)
TCO2: 26 mmol/L (ref 0–100)
TCO2: 24 mmol/L (ref 0–100)
TCO2: 19 mmol/L (ref 0–100)

## 2015-08-07 LAB — URINALYSIS, ROUTINE W REFLEX MICROSCOPIC
Bilirubin Urine: NEGATIVE
Glucose, UA: NEGATIVE mg/dL
Hgb urine dipstick: NEGATIVE
Ketones, ur: NEGATIVE mg/dL
Leukocytes, UA: NEGATIVE
Nitrite: NEGATIVE
Protein, ur: NEGATIVE mg/dL
Specific Gravity, Urine: 1.012 (ref 1.005–1.030)
Urobilinogen, UA: 0.2 mg/dL (ref 0.0–1.0)
pH: 6 (ref 5.0–8.0)

## 2015-08-07 LAB — GLUCOSE, CAPILLARY
GLUCOSE-CAPILLARY: 91 mg/dL (ref 65–99)
GLUCOSE-CAPILLARY: 108 mg/dL — AB (ref 65–99)
Glucose-Capillary: 36 mg/dL — CL (ref 65–99)
Glucose-Capillary: 138 mg/dL — ABNORMAL HIGH (ref 65–99)

## 2015-08-07 LAB — BASIC METABOLIC PANEL
Anion gap: 7 (ref 5–15)
BUN: 16 mg/dL (ref 6–20)
CO2: 27 mmol/L (ref 22–32)
Calcium: 9 mg/dL (ref 8.9–10.3)
Chloride: 102 mmol/L (ref 101–111)
Creatinine, Ser: 1.03 mg/dL (ref 0.61–1.24)
GFR calc Af Amer: >60 mL/min (ref >60)
GFR calc non Af Amer: >60 mL/min (ref >60)
Glucose, Bld: 91 mg/dL (ref 65–99)
Potassium: 3.8 mmol/L (ref 3.5–5.1)
Sodium: 136 mmol/L (ref 135–145)

## 2015-08-07 LAB — BLOOD GAS, ARTERIAL
Acid-Base Excess: 1.6 mmol/L (ref 0.0–2.0)
Bicarbonate: 25.6 mEq/L — ABNORMAL HIGH (ref 20.0–24.0)
Drawn by: 441661
FIO2: 0.21
O2 Saturation: 96.1 %
Patient temperature: 98.6
TCO2: 26.8 mmol/L (ref 0–100)
pCO2 arterial: 39.6 mmHg (ref 35.0–45.0)
pH, Arterial: 7.426 (ref 7.350–7.450)
pO2, Arterial: 81 mmHg (ref 80.0–100.0)

## 2015-08-07 LAB — PROTIME-INR
INR: 1.54 — AB (ref 0.00–1.49)
Prothrombin Time: 18.5 seconds — ABNORMAL HIGH (ref 11.6–15.2)

## 2015-08-07 LAB — CREATININE, SERUM
Creatinine, Ser: 0.98 mg/dL (ref 0.61–1.24)
GFR calc Af Amer: >60 mL/min (ref >60)
GFR calc non Af Amer: >60 mL/min (ref >60)

## 2015-08-07 LAB — HEMOGLOBIN AND HEMATOCRIT, BLOOD
HCT: 25.8 % — ABNORMAL LOW (ref 39.0–52.0)
Hemoglobin: 8.8 g/dL — ABNORMAL LOW (ref 13.0–17.0)

## 2015-08-07 LAB — HEMOGLOBIN A1C
Hgb A1c MFr Bld: 5.7 % — ABNORMAL HIGH (ref 4.8–5.6)
MEAN PLASMA GLUCOSE: 117 mg/dL

## 2015-08-07 LAB — POCT I-STAT 4, (NA,K, GLUC, HGB,HCT)
GLUCOSE: 103 mg/dL — AB (ref 65–99)
HEMATOCRIT: 32 % — AB (ref 39.0–52.0)
Hemoglobin: 10.9 g/dL — ABNORMAL LOW (ref 13.0–17.0)
Potassium: 3.7 mmol/L (ref 3.5–5.1)
Sodium: 138 mmol/L (ref 135–145)

## 2015-08-07 LAB — APTT: aPTT: 36 seconds (ref 24–37)

## 2015-08-07 LAB — PLATELET COUNT: Platelets: 122 10*3/uL — ABNORMAL LOW (ref 150–400)

## 2015-08-07 LAB — MAGNESIUM: Magnesium: 2.9 mg/dL — ABNORMAL HIGH (ref 1.7–2.4)

## 2015-08-07 LAB — HEPARIN LEVEL (UNFRACTIONATED): Heparin Unfractionated: 0.42 [IU]/mL (ref 0.30–0.70)

## 2015-08-07 SURGERY — CORONARY ARTERY BYPASS GRAFTING (CABG)
Anesthesia: General | Site: Chest

## 2015-08-07 MED ORDER — 0.9 % SODIUM CHLORIDE (POUR BTL) OPTIME
TOPICAL | Status: DC | PRN
  Administered 2015-08-07: 5000 mL

## 2015-08-07 MED ORDER — OXYCODONE HCL 5 MG PO TABS
5.0000 mg | ORAL_TABLET | ORAL | Status: DC | PRN
  Administered 2015-08-08 (×2): 5 mg via ORAL
  Filled 2015-08-07 (×2): qty 1

## 2015-08-07 MED ORDER — LIDOCAINE HCL (CARDIAC) 20 MG/ML IV SOLN
INTRAVENOUS | Status: DC | PRN
  Administered 2015-08-07: 100 mg via INTRAVENOUS

## 2015-08-07 MED ORDER — ONDANSETRON HCL 4 MG/2ML IJ SOLN: 4.0000 mg | Freq: Four times a day (QID) | INTRAMUSCULAR | Status: DC | PRN

## 2015-08-07 MED ORDER — ACETAMINOPHEN 500 MG PO TABS
1000.0000 mg | ORAL_TABLET | Freq: Four times a day (QID) | ORAL | Status: DC
  Administered 2015-08-08 – 2015-08-09 (×8): 1000 mg via ORAL
  Filled 2015-08-07 (×10): qty 2

## 2015-08-07 MED ORDER — ROCURONIUM BROMIDE 50 MG/5ML IV SOLN
INTRAVENOUS | Status: AC
  Filled 2015-08-07: qty 1

## 2015-08-07 MED ORDER — FENTANYL CITRATE (PF) 100 MCG/2ML IJ SOLN
INTRAMUSCULAR | Status: AC
  Administered 2015-08-07: 50 ug via INTRAVENOUS
  Filled 2015-08-07: qty 2

## 2015-08-07 MED ORDER — NITROGLYCERIN IN D5W 200-5 MCG/ML-% IV SOLN: 0.0000 ug/min | INTRAVENOUS | Status: DC

## 2015-08-07 MED ORDER — FENTANYL CITRATE (PF) 100 MCG/2ML IJ SOLN
50.0000 ug | Freq: Once | INTRAMUSCULAR | Status: AC
  Administered 2015-08-07: 50 ug via INTRAVENOUS

## 2015-08-07 MED ORDER — ASPIRIN 81 MG PO CHEW: 324.0000 mg | CHEWABLE_TABLET | Freq: Every day | ORAL | Status: DC

## 2015-08-07 MED ORDER — HEMOSTATIC AGENTS (NO CHARGE) OPTIME
TOPICAL | Status: DC | PRN
  Administered 2015-08-07: 1 via TOPICAL
  Administered 2015-08-07: 2 via TOPICAL
  Administered 2015-08-07: 1 via TOPICAL

## 2015-08-07 MED ORDER — MIDAZOLAM HCL 2 MG/2ML IJ SOLN: 2.0000 mg | INTRAMUSCULAR | Status: DC | PRN

## 2015-08-07 MED ORDER — INSULIN REGULAR HUMAN 100 UNIT/ML IJ SOLN: INTRAMUSCULAR | Status: DC

## 2015-08-07 MED ORDER — DEXTROSE 50 % IV SOLN
INTRAVENOUS | Status: AC
  Filled 2015-08-07: qty 50

## 2015-08-07 MED ORDER — STERILE WATER FOR INJECTION IJ SOLN
INTRAMUSCULAR | Status: AC
  Filled 2015-08-07: qty 10

## 2015-08-07 MED ORDER — VECURONIUM BROMIDE 10 MG IV SOLR
INTRAVENOUS | Status: AC
  Filled 2015-08-07: qty 10

## 2015-08-07 MED ORDER — ACETAMINOPHEN 160 MG/5ML PO SOLN: 650.0000 mg | Freq: Once | ORAL | Status: AC

## 2015-08-07 MED ORDER — ALBUMIN HUMAN 5 % IV SOLN
250.0000 mL | INTRAVENOUS | Status: AC | PRN
  Administered 2015-08-07 (×2): 250 mL via INTRAVENOUS

## 2015-08-07 MED ORDER — METOPROLOL TARTRATE 12.5 MG HALF TABLET
12.5000 mg | ORAL_TABLET | Freq: Two times a day (BID) | ORAL | Status: DC
  Administered 2015-08-08 – 2015-08-09 (×2): 12.5 mg via ORAL
  Filled 2015-08-07 (×5): qty 1

## 2015-08-07 MED ORDER — SODIUM CHLORIDE 0.9 % IJ SOLN
3.0000 mL | Freq: Two times a day (BID) | INTRAMUSCULAR | Status: DC
  Administered 2015-08-08 – 2015-08-09 (×3): 3 mL via INTRAVENOUS

## 2015-08-07 MED ORDER — METOPROLOL TARTRATE 25 MG/10 ML ORAL SUSPENSION
12.5000 mg | Freq: Two times a day (BID) | ORAL | Status: DC
  Filled 2015-08-07 (×5): qty 5

## 2015-08-07 MED ORDER — ARTIFICIAL TEARS OP OINT
TOPICAL_OINTMENT | OPHTHALMIC | Status: DC | PRN
  Administered 2015-08-07: 1 via OPHTHALMIC

## 2015-08-07 MED ORDER — CHLORHEXIDINE GLUCONATE 0.12% ORAL RINSE (MEDLINE KIT)
15.0000 mL | Freq: Two times a day (BID) | OROMUCOSAL | Status: DC
  Administered 2015-08-07 – 2015-08-09 (×4): 15 mL via OROMUCOSAL

## 2015-08-07 MED ORDER — MIDAZOLAM HCL 10 MG/2ML IJ SOLN
INTRAMUSCULAR | Status: AC
  Filled 2015-08-07: qty 4

## 2015-08-07 MED ORDER — MIDAZOLAM HCL 5 MG/5ML IJ SOLN
INTRAMUSCULAR | Status: DC | PRN
  Administered 2015-08-07 (×2): 5 mg via INTRAVENOUS

## 2015-08-07 MED ORDER — LACTATED RINGERS IV SOLN: INTRAVENOUS | Status: DC

## 2015-08-07 MED ORDER — BISACODYL 10 MG RE SUPP: 10.0000 mg | Freq: Every day | RECTAL | Status: DC

## 2015-08-07 MED ORDER — SODIUM CHLORIDE 0.9 % IJ SOLN: 3.0000 mL | INTRAMUSCULAR | Status: DC | PRN

## 2015-08-07 MED ORDER — VECURONIUM BROMIDE 10 MG IV SOLR
INTRAVENOUS | Status: DC | PRN
  Administered 2015-08-07: 10 mg via INTRAVENOUS

## 2015-08-07 MED ORDER — SODIUM CHLORIDE 0.9 % IV SOLN: 250.0000 mL | INTRAVENOUS | Status: DC

## 2015-08-07 MED ORDER — ASPIRIN EC 325 MG PO TBEC
325.0000 mg | DELAYED_RELEASE_TABLET | Freq: Every day | ORAL | Status: DC
Start: 2015-08-08 — End: 2015-08-09
  Administered 2015-08-08 – 2015-08-09 (×2): 325 mg via ORAL
  Filled 2015-08-07 (×2): qty 1

## 2015-08-07 MED ORDER — BISACODYL 5 MG PO TBEC
10.0000 mg | DELAYED_RELEASE_TABLET | Freq: Every day | ORAL | Status: DC
  Administered 2015-08-08 – 2015-08-09 (×2): 10 mg via ORAL
  Filled 2015-08-07 (×2): qty 2

## 2015-08-07 MED ORDER — DEXTROSE 5 % IV SOLN
1.5000 g | Freq: Two times a day (BID) | INTRAVENOUS | Status: AC
  Administered 2015-08-07 – 2015-08-09 (×4): 1.5 g via INTRAVENOUS
  Filled 2015-08-07 (×4): qty 1.5

## 2015-08-07 MED ORDER — ROCURONIUM BROMIDE 100 MG/10ML IV SOLN
INTRAVENOUS | Status: DC | PRN
  Administered 2015-08-07 (×3): 50 mg via INTRAVENOUS

## 2015-08-07 MED ORDER — FENTANYL CITRATE (PF) 250 MCG/5ML IJ SOLN
INTRAMUSCULAR | Status: AC
  Filled 2015-08-07: qty 5

## 2015-08-07 MED ORDER — MORPHINE SULFATE (PF) 2 MG/ML IV SOLN
1.0000 mg | INTRAVENOUS | Status: AC | PRN
  Administered 2015-08-07 (×2): 2 mg via INTRAVENOUS
  Filled 2015-08-07 (×2): qty 1

## 2015-08-07 MED ORDER — VANCOMYCIN HCL IN DEXTROSE 1-5 GM/200ML-% IV SOLN
1000.0000 mg | Freq: Once | INTRAVENOUS | Status: AC
  Administered 2015-08-07: 1000 mg via INTRAVENOUS
  Filled 2015-08-07: qty 200

## 2015-08-07 MED ORDER — FENTANYL CITRATE (PF) 100 MCG/2ML IJ SOLN
INTRAMUSCULAR | Status: DC | PRN
  Administered 2015-08-07: 100 ug via INTRAVENOUS
  Administered 2015-08-07: 50 ug via INTRAVENOUS
  Administered 2015-08-07: 100 ug via INTRAVENOUS
  Administered 2015-08-07: 250 ug via INTRAVENOUS
  Administered 2015-08-07 (×2): 50 ug via INTRAVENOUS
  Administered 2015-08-07 (×3): 100 ug via INTRAVENOUS
  Administered 2015-08-07: 250 ug via INTRAVENOUS
  Administered 2015-08-07: 50 ug via INTRAVENOUS
  Administered 2015-08-07: 350 ug via INTRAVENOUS

## 2015-08-07 MED ORDER — DEXMEDETOMIDINE HCL IN NACL 200 MCG/50ML IV SOLN: 0.0000 ug/kg/h | INTRAVENOUS | Status: DC

## 2015-08-07 MED ORDER — PROTAMINE SULFATE 10 MG/ML IV SOLN
INTRAVENOUS | Status: DC | PRN
  Administered 2015-08-07: 10 mg via INTRAVENOUS
  Administered 2015-08-07: 260 mg via INTRAVENOUS

## 2015-08-07 MED ORDER — METOPROLOL TARTRATE 1 MG/ML IV SOLN: 2.5000 mg | INTRAVENOUS | Status: DC | PRN

## 2015-08-07 MED ORDER — DEXTROSE 50 % IV SOLN
26.0000 mL | Freq: Once | INTRAVENOUS | Status: AC
  Administered 2015-08-07: 26 mL via INTRAVENOUS

## 2015-08-07 MED ORDER — LACTATED RINGERS IV SOLN
INTRAVENOUS | Status: DC | PRN
  Administered 2015-08-07 (×2): via INTRAVENOUS

## 2015-08-07 MED ORDER — GELATIN ABSORBABLE MT POWD
OROMUCOSAL | Status: DC | PRN
  Administered 2015-08-07 (×3): 4 mL via TOPICAL

## 2015-08-07 MED ORDER — LACTATED RINGERS IV SOLN
INTRAVENOUS | Status: DC | PRN
  Administered 2015-08-07 (×2): via INTRAVENOUS

## 2015-08-07 MED ORDER — EPHEDRINE SULFATE 50 MG/ML IJ SOLN
INTRAMUSCULAR | Status: DC | PRN
  Administered 2015-08-07: 5 mg via INTRAVENOUS
  Administered 2015-08-07: 10 mg via INTRAVENOUS

## 2015-08-07 MED ORDER — LIDOCAINE HCL (CARDIAC) 20 MG/ML IV SOLN
INTRAVENOUS | Status: AC
  Filled 2015-08-07: qty 5

## 2015-08-07 MED ORDER — ARTIFICIAL TEARS OP OINT
TOPICAL_OINTMENT | OPHTHALMIC | Status: AC
  Filled 2015-08-07: qty 3.5

## 2015-08-07 MED ORDER — MIDAZOLAM HCL 2 MG/2ML IJ SOLN
1.0000 mg | Freq: Once | INTRAMUSCULAR | Status: AC
  Administered 2015-08-07: 1 mg via INTRAVENOUS

## 2015-08-07 MED ORDER — PANTOPRAZOLE SODIUM 40 MG PO TBEC
40.0000 mg | DELAYED_RELEASE_TABLET | Freq: Every day | ORAL | Status: DC
  Administered 2015-08-09: 40 mg via ORAL
  Filled 2015-08-07: qty 1

## 2015-08-07 MED ORDER — SODIUM CHLORIDE 0.9 % IV SOLN
INTRAVENOUS | Status: DC
  Administered 2015-08-07: 17:00:00 via INTRAVENOUS

## 2015-08-07 MED ORDER — MORPHINE SULFATE (PF) 2 MG/ML IV SOLN
2.0000 mg | INTRAVENOUS | Status: DC | PRN
  Administered 2015-08-08: 1 mg via INTRAVENOUS
  Administered 2015-08-08 (×2): 2 mg via INTRAVENOUS
  Filled 2015-08-07 (×3): qty 1

## 2015-08-07 MED ORDER — ACETAMINOPHEN 160 MG/5ML PO SOLN: 1000.0000 mg | Freq: Four times a day (QID) | ORAL | Status: DC

## 2015-08-07 MED ORDER — HEPARIN SODIUM (PORCINE) 1000 UNIT/ML IJ SOLN
INTRAMUSCULAR | Status: DC | PRN
  Administered 2015-08-07: 27000 [IU] via INTRAVENOUS

## 2015-08-07 MED ORDER — INSULIN REGULAR BOLUS VIA INFUSION
0.0000 [IU] | Freq: Three times a day (TID) | INTRAVENOUS | Status: DC
  Filled 2015-08-07: qty 10

## 2015-08-07 MED ORDER — ACETAMINOPHEN 650 MG RE SUPP
650.0000 mg | Freq: Once | RECTAL | Status: AC
  Administered 2015-08-07: 650 mg via RECTAL

## 2015-08-07 MED ORDER — DOCUSATE SODIUM 100 MG PO CAPS
200.0000 mg | ORAL_CAPSULE | Freq: Every day | ORAL | Status: DC
  Administered 2015-08-08 – 2015-08-09 (×2): 200 mg via ORAL
  Filled 2015-08-07 (×2): qty 2

## 2015-08-07 MED ORDER — PROPOFOL 10 MG/ML IV BOLUS
INTRAVENOUS | Status: AC
  Filled 2015-08-07: qty 20

## 2015-08-07 MED ORDER — ANTISEPTIC ORAL RINSE SOLUTION (CORINZ)
7.0000 mL | OROMUCOSAL | Status: DC
  Administered 2015-08-07 – 2015-08-09 (×17): 7 mL via OROMUCOSAL

## 2015-08-07 MED ORDER — PHENYLEPHRINE 40 MCG/ML (10ML) SYRINGE FOR IV PUSH (FOR BLOOD PRESSURE SUPPORT)
PREFILLED_SYRINGE | INTRAVENOUS | Status: AC
  Filled 2015-08-07: qty 10

## 2015-08-07 MED ORDER — INSULIN ASPART 100 UNIT/ML ~~LOC~~ SOLN
0.0000 [IU] | SUBCUTANEOUS | Status: DC
  Administered 2015-08-07: 4 [IU] via SUBCUTANEOUS
  Administered 2015-08-08 (×3): 2 [IU] via SUBCUTANEOUS

## 2015-08-07 MED ORDER — LACTATED RINGERS IV SOLN: 500.0000 mL | Freq: Once | INTRAVENOUS | Status: DC | PRN

## 2015-08-07 MED ORDER — MIDAZOLAM HCL 2 MG/2ML IJ SOLN
INTRAMUSCULAR | Status: AC
  Administered 2015-08-07: 1 mg via INTRAVENOUS
  Filled 2015-08-07: qty 2

## 2015-08-07 MED ORDER — TRAMADOL HCL 50 MG PO TABS
50.0000 mg | ORAL_TABLET | ORAL | Status: DC | PRN
  Administered 2015-08-08: 50 mg via ORAL
  Administered 2015-08-08: 100 mg via ORAL
  Filled 2015-08-07: qty 2
  Filled 2015-08-07: qty 1

## 2015-08-07 MED ORDER — MAGNESIUM SULFATE 4 GM/100ML IV SOLN
4.0000 g | Freq: Once | INTRAVENOUS | Status: AC
  Administered 2015-08-07: 4 g via INTRAVENOUS
  Filled 2015-08-07: qty 100

## 2015-08-07 MED ORDER — HEPARIN SODIUM (PORCINE) 1000 UNIT/ML IJ SOLN
INTRAMUSCULAR | Status: AC
  Filled 2015-08-07: qty 1

## 2015-08-07 MED ORDER — FAMOTIDINE IN NACL 20-0.9 MG/50ML-% IV SOLN
20.0000 mg | Freq: Two times a day (BID) | INTRAVENOUS | Status: AC
  Administered 2015-08-07 (×2): 20 mg via INTRAVENOUS
  Filled 2015-08-07: qty 50

## 2015-08-07 MED ORDER — POTASSIUM CHLORIDE 10 MEQ/50ML IV SOLN
10.0000 meq | INTRAVENOUS | Status: AC
  Administered 2015-08-07 (×3): 10 meq via INTRAVENOUS

## 2015-08-07 MED ORDER — DEXTROSE 5 % IV SOLN
0.0000 ug/min | INTRAVENOUS | Status: DC
  Administered 2015-08-09: 20 ug/min via INTRAVENOUS
  Filled 2015-08-07 (×3): qty 2

## 2015-08-07 MED ORDER — SODIUM CHLORIDE 0.45 % IV SOLN
INTRAVENOUS | Status: DC | PRN
  Administered 2015-08-07: 17:00:00 via INTRAVENOUS

## 2015-08-07 MED ORDER — PROPOFOL 10 MG/ML IV BOLUS
INTRAVENOUS | Status: DC | PRN
  Administered 2015-08-07: 100 mg via INTRAVENOUS

## 2015-08-07 MED ORDER — LACTATED RINGERS IV SOLN
INTRAVENOUS | Status: DC
  Administered 2015-08-07 (×2): via INTRAVENOUS

## 2015-08-07 SURGICAL SUPPLY — 89 items
BAG DECANTER FOR FLEXI CONT (MISCELLANEOUS) ×4 IMPLANT
BANDAGE ELASTIC 4 VELCRO ST LF (GAUZE/BANDAGES/DRESSINGS) ×8 IMPLANT
BANDAGE ELASTIC 6 VELCRO ST LF (GAUZE/BANDAGES/DRESSINGS) ×8 IMPLANT
BLADE STERNUM SYSTEM 6 (BLADE) ×4 IMPLANT
BLADE SURG 11 STRL SS (BLADE) ×4 IMPLANT
BNDG GAUZE ELAST 4 BULKY (GAUZE/BANDAGES/DRESSINGS) ×4 IMPLANT
CANISTER SUCTION 2500CC (MISCELLANEOUS) ×4 IMPLANT
CATH CPB KIT GERHARDT (MISCELLANEOUS) ×4 IMPLANT
CATH THORACIC 28FR (CATHETERS) ×4 IMPLANT
COUNTER NEEDLE 20 DBL MAG RED (NEEDLE) ×4 IMPLANT
CRADLE DONUT ADULT HEAD (MISCELLANEOUS) ×4 IMPLANT
DERMABOND ADVANCED (GAUZE/BANDAGES/DRESSINGS) ×2
DERMABOND ADVANCED .7 DNX12 (GAUZE/BANDAGES/DRESSINGS) ×2 IMPLANT
DRAIN CHANNEL 28F RND 3/8 FF (WOUND CARE) ×4 IMPLANT
DRAPE CARDIOVASCULAR INCISE (DRAPES) ×2
DRAPE SLUSH/WARMER DISC (DRAPES) ×4 IMPLANT
DRAPE SRG 135X102X78XABS (DRAPES) ×2 IMPLANT
DRSG AQUACEL AG ADV 3.5X14 (GAUZE/BANDAGES/DRESSINGS) ×4 IMPLANT
DRSG KUZMA FLUFF (GAUZE/BANDAGES/DRESSINGS) ×8 IMPLANT
ELECT BLADE 4.0 EZ CLEAN MEGAD (MISCELLANEOUS) ×4
ELECT REM PT RETURN 9FT ADLT (ELECTROSURGICAL) ×8
ELECTRODE BLDE 4.0 EZ CLN MEGD (MISCELLANEOUS) ×2 IMPLANT
ELECTRODE REM PT RTRN 9FT ADLT (ELECTROSURGICAL) ×4 IMPLANT
GAUZE SPONGE 4X4 12PLY STRL (GAUZE/BANDAGES/DRESSINGS) ×8 IMPLANT
GLOVE BIO SURGEON STRL SZ 6 (GLOVE) ×12 IMPLANT
GLOVE BIO SURGEON STRL SZ 6.5 (GLOVE) ×18 IMPLANT
GLOVE BIO SURGEONS STRL SZ 6.5 (GLOVE) ×6
GOWN STRL REUS W/ TWL LRG LVL3 (GOWN DISPOSABLE) ×16 IMPLANT
GOWN STRL REUS W/TWL LRG LVL3 (GOWN DISPOSABLE) ×16
HEMOSTAT POWDER SURGIFOAM 1G (HEMOSTASIS) ×12 IMPLANT
HEMOSTAT SURGICEL 2X14 (HEMOSTASIS) ×8 IMPLANT
KIT BASIN OR (CUSTOM PROCEDURE TRAY) ×4 IMPLANT
KIT CATH SUCT 8FR (CATHETERS) ×4 IMPLANT
KIT ROOM TURNOVER OR (KITS) ×4 IMPLANT
KIT SUCTION CATH 14FR (SUCTIONS) ×8 IMPLANT
KIT VASOVIEW W/TROCAR VH 2000 (KITS) ×4 IMPLANT
LEAD PACING MYOCARDI (MISCELLANEOUS) ×4 IMPLANT
MARKER GRAFT CORONARY BYPASS (MISCELLANEOUS) ×12 IMPLANT
NS IRRIG 1000ML POUR BTL (IV SOLUTION) ×20 IMPLANT
PACK OPEN HEART (CUSTOM PROCEDURE TRAY) ×4 IMPLANT
PAD ARMBOARD 7.5X6 YLW CONV (MISCELLANEOUS) ×8 IMPLANT
PAD ELECT DEFIB RADIOL ZOLL (MISCELLANEOUS) ×4 IMPLANT
PENCIL BUTTON HOLSTER BLD 10FT (ELECTRODE) ×4 IMPLANT
PUNCH AORTIC ROT 4.0MM RCL 40 (MISCELLANEOUS) ×4 IMPLANT
SET CARDIOPLEGIA MPS 5001102 (MISCELLANEOUS) ×4 IMPLANT
SPONGE GAUZE 4X4 12PLY STER LF (GAUZE/BANDAGES/DRESSINGS) ×12 IMPLANT
SPONGE LAP 18X18 X RAY DECT (DISPOSABLE) ×12 IMPLANT
SUT BONE WAX W31G (SUTURE) ×4 IMPLANT
SUT ETHIBOND 2 0 SH (SUTURE) ×8
SUT ETHIBOND 2 0 SH 36X2 (SUTURE) ×8 IMPLANT
SUT MNCRL AB 4-0 PS2 18 (SUTURE) ×4 IMPLANT
SUT PROLENE 3 0 SH DA (SUTURE) ×4 IMPLANT
SUT PROLENE 3 0 SH1 36 (SUTURE) ×4 IMPLANT
SUT PROLENE 4 0 RB 1 (SUTURE) ×2
SUT PROLENE 4 0 TF (SUTURE) ×8 IMPLANT
SUT PROLENE 4-0 RB1 .5 CRCL 36 (SUTURE) ×2 IMPLANT
SUT PROLENE 5 0 C 1 36 (SUTURE) ×8 IMPLANT
SUT PROLENE 6 0 C 1 30 (SUTURE) ×8 IMPLANT
SUT PROLENE 6 0 CC (SUTURE) ×8 IMPLANT
SUT PROLENE 7 0 BV 1 (SUTURE) ×8 IMPLANT
SUT PROLENE 7 0 BV1 MDA (SUTURE) ×4 IMPLANT
SUT PROLENE 8 0 BV175 6 (SUTURE) ×20 IMPLANT
SUT SILK  1 MH (SUTURE) ×6
SUT SILK 1 MH (SUTURE) ×6 IMPLANT
SUT SILK 1 TIES 10X30 (SUTURE) ×4 IMPLANT
SUT SILK 2 0 SH CR/8 (SUTURE) ×8 IMPLANT
SUT SILK 2 0 TIES 10X30 (SUTURE) ×4 IMPLANT
SUT SILK 2 0 TIES 17X18 (SUTURE) ×2
SUT SILK 2-0 18XBRD TIE BLK (SUTURE) ×2 IMPLANT
SUT SILK 3 0 SH CR/8 (SUTURE) ×4 IMPLANT
SUT SILK 4 0 TIE 10X30 (SUTURE) ×8 IMPLANT
SUT STEEL 6MS V (SUTURE) ×4 IMPLANT
SUT STEEL SZ 6 DBL 3X14 BALL (SUTURE) ×4 IMPLANT
SUT TEM PAC WIRE 2 0 SH (SUTURE) ×4 IMPLANT
SUT VIC AB 1 CTX 18 (SUTURE) ×8 IMPLANT
SUT VIC AB 2-0 CTX 27 (SUTURE) ×8 IMPLANT
SUT VIC AB 3-0 SH 27 (SUTURE) ×2
SUT VIC AB 3-0 SH 27X BRD (SUTURE) ×2 IMPLANT
SUT VIC AB 3-0 X1 27 (SUTURE) ×8 IMPLANT
SUTURE E-PAK OPEN HEART (SUTURE) ×4 IMPLANT
SYSTEM SAHARA CHEST DRAIN ATS (WOUND CARE) ×4 IMPLANT
TAPE CLOTH SURG 4X10 WHT LF (GAUZE/BANDAGES/DRESSINGS) ×8 IMPLANT
TAPE PAPER 2X10 WHT MICROPORE (GAUZE/BANDAGES/DRESSINGS) ×4 IMPLANT
TOWEL OR 17X24 6PK STRL BLUE (TOWEL DISPOSABLE) ×8 IMPLANT
TOWEL OR 17X26 10 PK STRL BLUE (TOWEL DISPOSABLE) ×8 IMPLANT
TRAY FOLEY IC TEMP SENS 16FR (CATHETERS) ×4 IMPLANT
TUBING INSUFFLATION (TUBING) ×4 IMPLANT
UNDERPAD 30X30 INCONTINENT (UNDERPADS AND DIAPERS) ×4 IMPLANT
WATER STERILE IRR 1000ML POUR (IV SOLUTION) ×8 IMPLANT

## 2015-08-07 NOTE — Progress Notes (Signed)
Echocardiogram Echocardiogram Transesophageal has been performed.  Joelene Millin 08/07/2015, 1:55 PM

## 2015-08-07 NOTE — Procedures (Signed)
Extubation Procedure Note  Patient Details:   Name: Isaac [name] DOB: [birth year] MRN: [id number]   Airway Documentation:     Evaluation  O2 sats: stable throughout Complications: No apparent complications Patient did tolerate procedure well. Bilateral Breath Sounds: Clear Suctioning: Airway Yes    Pt parameters before extubation were NIF -36 and VC of 2.1.  Patient had a positive cuff leak when air was taken out of the cuff. Patient was extubated to a 4 L [place].  Vitals are WNL.   Carrington Clamp A 08/07/2015, 10:19 PM

## 2015-08-07 NOTE — Anesthesia Procedure Notes (Addendum)
Anesthesia Procedure Note The patient was identified and consent obtained.  TO was performed, and full barrier precautions were used.  The skin was anesthetized with lidocaine.  Once the vein was located with the 22 ga., the wire was inserted into the vein. The insertion site was dilated and the CVL was carefully inserted and sutured in place. The patient tolerated the procedure well.  CXR was ordered for PACU.Ultrasound guidance: relevant anatomy identified, needle position confirmed, vessel patent, wire seen inside the vessel.  Images printed for the medical record.  Start: 1010 End: [place], MD  Procedure Name: Intubation Performed by: Verdie Drown Pre-anesthesia Checklist: Patient identified, Emergency Drugs available, Suction available, Patient being monitored and Timeout performed Patient Re-evaluated:Patient Re-evaluated prior to inductionOxygen Delivery Method: Circle system utilized Preoxygenation: Pre-oxygenation with 100% oxygen Intubation Type: IV induction Ventilation: Mask ventilation without difficulty and Oral airway inserted - appropriate to patient size Laryngoscope Size: Mac and 3 Tube type: Oral Number of attempts: 1 Airway Equipment and Method: Stylet Placement Confirmation: ETT inserted through vocal cords under direct vision,  breath sounds checked- equal and bilateral and positive ETCO2 Secured at: 22 cm Tube secured with: Tape Dental Injury: Teeth and Oropharynx as per pre-operative assessment

## 2015-08-07 NOTE — Progress Notes (Signed)
ANTICOAGULATION CONSULT NOTE - Follow Up Consult  Pharmacy Consult for Heparin Indication: severe 2-vessel CAD. for OHS 0n 9/13 pm  No Known Allergies  Patient Measurements: Height: 5\' 5"  (165.1 cm) Weight: 154 lb (69.854 kg) IBW/kg (Calculated) : 61.5 Heparin Dosing Weight: 69.8 kg  Vital Signs: Temp: 97.7 F (36.5 C) (09/13 0623) Temp Source: Oral (09/13 0623) BP: 144/58 mmHg (09/13 0623) Pulse Rate: 50 (09/13 0623)  Labs:  Recent Labs  08/05/15 0350 08/06/15 0408 08/07/15 0342  HGB 14.1 14.4 14.0  HCT 41.1 42.5 40.9  PLT 189 195 180  HEPARINUNFRC 0.36 0.64 0.42  CREATININE  --  1.10 1.03    Estimated Creatinine Clearance: 44 mL/min (by C-G formula based on Cr of 1.03).  Assessment:   Heparin level remains therapeutic on 1150 units/hr. CBC stable.   For OHS on 9/13 at ~ 12:45pm.  Goal of Therapy:  Heparin level 0.3-0.7 units/ml Monitor platelets by anticoagulation protocol: Yes   Plan:   Continue heparin drip at 1150 units/hr.  F/U after OHS  Heparin to stop on call to OR on 9/13.  Thank you Anette Guarneri, PharmD [phone]  08/07/2015,8:36 AM

## 2015-08-07 NOTE — Transfer of Care (Signed)
Immediate Anesthesia Transfer of Care Note  Patient: Isaac [name]  Procedure(s) Performed: Procedure(s): CORONARY ARTERY BYPASS GRAFTING (CABG) x 3 with Endoscopic Vein Harvesting of greater saphenous vein from bilateral thighs (N/A) TRANSESOPHAGEAL ECHOCARDIOGRAM (TEE) (N/A)  Patient Location: SICU  Anesthesia Type:General  Level of Consciousness: sedated and Patient remains intubated per anesthesia plan  Airway & Oxygen Therapy: Patient remains intubated per anesthesia plan and Patient placed on Ventilator (see vital sign flow sheet for setting)  Post-op Assessment: Report given to RN and Post -op Vital signs reviewed and stable  Post vital signs: Reviewed and stable  Last Vitals:  Filed Vitals:   08/07/15 1630  BP: 116/49  Pulse: 80  Temp:   Resp: 12    Complications: No apparent anesthesia complications

## 2015-08-07 NOTE — Plan of Care (Signed)
Problem: Phase II - Intermediate Post-Op Goal: Wean to Extubate Outcome: Completed/Met Date Met:  08/07/15 Extubated @ 4327

## 2015-08-07 NOTE — Anesthesia Preprocedure Evaluation (Addendum)
Anesthesia Evaluation  Patient identified by MRN, date of birth, ID band Patient awake    Reviewed: Allergy & Precautions, NPO status , Patient's Chart, lab work & pertinent test results  History of Anesthesia Complications Negative for: history of anesthetic complications  Airway Mallampati: II  TM Distance: >3 FB Neck ROM: Full    Dental no notable dental hx. (+) Teeth Intact, Dental Advisory Given   Pulmonary neg pulmonary ROS,    Pulmonary exam normal breath sounds clear to auscultation       Cardiovascular Exercise Tolerance: Good hypertension, Pt. on medications and Pt. on home beta blockers (-) angina+ CAD and + Past MI  Normal cardiovascular exam Rhythm:Regular Rate:Normal     Neuro/Psych negative neurological ROS  negative psych ROS   GI/Hepatic negative GI ROS, Neg liver ROS,   Endo/Other  negative endocrine ROS  Renal/GU negative Renal ROS     Musculoskeletal negative musculoskeletal ROS (+)   Abdominal   Peds  Hematology negative hematology ROS (+)   Anesthesia Other Findings Day of surgery medications reviewed with the patient.  Reproductive/Obstetrics                            Anesthesia Physical Anesthesia Plan  ASA: IV  Anesthesia Plan: General   Post-op Pain Management:    Induction: Intravenous  Airway Management Planned: Oral ETT  Additional Equipment: Arterial line, CVP, PA Cath, TEE and Ultrasound Guidance Line Placement  Intra-op Plan:   Post-operative Plan: Post-operative intubation/ventilation  Informed Consent: I have reviewed the patients History and Physical, chart, labs and discussed the procedure including the risks, benefits and alternatives for the proposed anesthesia with the patient or authorized representative who has indicated his/her understanding and acceptance.   Dental advisory given  Plan Discussed with: CRNA, Surgeon and  Anesthesiologist  Anesthesia Plan Comments: (Risks/benefits of general anesthesia discussed with patient including risk of damage to teeth, lips, gum, and tongue, nausea/vomiting, allergic reactions to medications, and the possibility of heart attack, stroke and death.  All patient questions answered.  Patient wishes to proceed.)        Anesthesia Quick Evaluation Cath 08/03/15:  Mid RCA to Dist RCA lesion, 15% stenosed. The lesion was previously treated with a stent (unknown type) greater than two years ago.  Prox LAD lesion, 30% stenosed.  Ost 1st Diag to 1st Diag lesion, 99% stenosed.  Ost Cx to Prox Cx lesion, 95% stenosed.  Prox Cx lesion, 95% stenosed.  There is severe left ventricular systolic dysfunction.  Ost LAD lesion, 65% stenosed.  1. Severe 2 vessel obstructive CAD  -Ostial LAD stenosis is moderate with significantly abnormal FFR of 0.75  -Critical proximal LCx stenosis with tandem lesions involving take off of large OM branch  - patent stent in mid RCA  2. Normal LV function. EF 55-65%

## 2015-08-07 NOTE — Progress Notes (Signed)
abg sent down for testing.

## 2015-08-07 NOTE — OR Nursing (Signed)
SICU notified of surgery progress at 1530, 1557, and 1626.

## 2015-08-07 NOTE — Progress Notes (Signed)
T[facility] BRIEF SICU PROGRESS NOTE  Day of Surgery  S/P Procedure(s) (LRB): CORONARY ARTERY BYPASS GRAFTING (CABG) x 3 with Endoscopic Vein Harvesting of greater saphenous vein from bilateral thighs (N/A) TRANSESOPHAGEAL ECHOCARDIOGRAM (TEE) (N/A)   Sedated on vent AAI paced w/ stable hemodynamics Chest tube output low UOP > 150 mL/hr Labs okay except CBG 36  Plan: Continue routine early postop - glucose management per Glucomander  Rexene Alberts 08/07/2015 6:14 PM

## 2015-08-07 NOTE — Brief Op Note (Addendum)
      [address]       [place] [address]             [phone]       08/07/2015  2:38 PM  PATIENT:  Isaac [name]  81 y.o. male  PRE-OPERATIVE DIAGNOSIS:  1. S/p NSTEMI 2.Coronary Artery Disease  POST-OPERATIVE DIAGNOSIS:  1. S/p NSTEMI 2.Coronary Artery Disease  PROCEDURE:  TRANSESOPHAGEAL ECHOCARDIOGRAM (TEE), MEDIAN STERNOTOMY for CORONARY ARTERY BYPASS GRAFTING (CABG) x 3 (LIMA to LAD, SVG SEQUENTIALLY to DISTAL CIRCUMFLEX and OM1) with EVH of greater saphenous vein from both thighs  SURGEON:  Surgeon(s) and Role:    * [name], MD - Primary  PHYSICIAN ASSISTANT: Lars Pinks PA-C  ANESTHESIA:   general  EBL:  Total I/O In: 1600 [I.V.:1600] Out: 445 [Urine:445]  DRAINS: Chest tubes placed in the mediastinal and left pleural spaces, foley, left radial a line, rt ij s/g   COUNTS CORRECT:  YES  DICTATION: .Dragon Dictation  PLAN OF CARE: Admit to inpatient   PATIENT DISPOSITION:  ICU - intubated and hemodynamically stable.   Delay start of Pharmacological VTE agent (>24hrs) due to surgical blood loss or risk of bleeding: yes  BASELINE WEIGHT: 69 kg

## 2015-08-07 NOTE — Progress Notes (Signed)
Patient assessment performed at 1050.

## 2015-08-07 NOTE — Progress Notes (Signed)
Hypoglycemic Event  CBG: 36  Treatment: 26cc Dextrose per Glucostabilizer order  Symptoms: None  Follow-up CBG: Time: 3435   CBG Result:91  Possible Reasons for Event: patient on insulin drip per glucostabilizer.  S/p CABG  Comments/MD notified:Patient treated per Glucostabilizer orders of 26cc Dextrose per protocol.  MD to be notified upon evening rounds.  Dr. Roxy Manns made aware at [place], Isaac [name]  Remember to initiate Hypoglycemia Order Set & complete

## 2015-08-08 ENCOUNTER — Inpatient Hospital Stay (HOSPITAL_COMMUNITY): Payer: Medicare Other

## 2015-08-08 ENCOUNTER — Encounter (HOSPITAL_COMMUNITY): Payer: Self-pay | Admitting: Cardiothoracic Surgery

## 2015-08-08 LAB — GLUCOSE, CAPILLARY
GLUCOSE-CAPILLARY: 102 mg/dL — AB (ref 65–99)
GLUCOSE-CAPILLARY: 108 mg/dL — AB (ref 65–99)
GLUCOSE-CAPILLARY: 113 mg/dL — AB (ref 65–99)
GLUCOSE-CAPILLARY: 121 mg/dL — AB (ref 65–99)
GLUCOSE-CAPILLARY: 123 mg/dL — AB (ref 65–99)
GLUCOSE-CAPILLARY: 124 mg/dL — AB (ref 65–99)
GLUCOSE-CAPILLARY: 131 mg/dL — AB (ref 65–99)
GLUCOSE-CAPILLARY: 136 mg/dL — AB (ref 65–99)
GLUCOSE-CAPILLARY: 181 mg/dL — AB (ref 65–99)
Glucose-Capillary: 76 mg/dL (ref 65–99)
Glucose-Capillary: 162 mg/dL — ABNORMAL HIGH (ref 65–99)
Glucose-Capillary: 89 mg/dL (ref 65–99)
Glucose-Capillary: 156 mg/dL — ABNORMAL HIGH (ref 65–99)

## 2015-08-08 LAB — POCT I-STAT, CHEM 8
BUN: 18 mg/dL (ref 6–20)
CHLORIDE: 102 mmol/L (ref 101–111)
CREATININE: 1 mg/dL (ref 0.61–1.24)
Calcium, Ion: 1.17 mmol/L (ref 1.13–1.30)
Glucose, Bld: 159 mg/dL — ABNORMAL HIGH (ref 65–99)
HEMATOCRIT: 33 % — AB (ref 39.0–52.0)
HEMOGLOBIN: 11.2 g/dL — AB (ref 13.0–17.0)
Potassium: 4.2 mmol/L (ref 3.5–5.1)
SODIUM: 134 mmol/L — AB (ref 135–145)
TCO2: 19 mmol/L (ref 0–100)

## 2015-08-08 LAB — CBC
HCT: 33.7 % — ABNORMAL LOW (ref 39.0–52.0)
HEMATOCRIT: 33.6 % — AB (ref 39.0–52.0)
HEMOGLOBIN: 11.5 g/dL — AB (ref 13.0–17.0)
Hemoglobin: 11.5 g/dL — ABNORMAL LOW (ref 13.0–17.0)
MCH: 30.7 pg (ref 26.0–34.0)
MCH: 30.6 pg (ref 26.0–34.0)
MCHC: 34.1 g/dL (ref 30.0–36.0)
MCHC: 34.2 g/dL (ref 30.0–36.0)
MCV: 90.1 fL (ref 78.0–100.0)
MCV: 89.4 fL (ref 78.0–100.0)
PLATELETS: 143 10*3/uL — AB (ref 150–400)
Platelets: 141 10*3/uL — ABNORMAL LOW (ref 150–400)
RBC: 3.74 MIL/uL — AB (ref 4.22–5.81)
RBC: 3.76 MIL/uL — ABNORMAL LOW (ref 4.22–5.81)
RDW: 13.7 % (ref 11.5–15.5)
RDW: 13.9 % (ref 11.5–15.5)
WBC: 15.5 10*3/uL — AB (ref 4.0–10.5)
WBC: 18.9 10*3/uL — ABNORMAL HIGH (ref 4.0–10.5)

## 2015-08-08 LAB — MAGNESIUM
MAGNESIUM: 2.7 mg/dL — AB (ref 1.7–2.4)
Magnesium: 2.6 mg/dL — ABNORMAL HIGH (ref 1.7–2.4)

## 2015-08-08 LAB — BASIC METABOLIC PANEL
ANION GAP: 4 — AB (ref 5–15)
BUN: 13 mg/dL (ref 6–20)
CO2: 23 mmol/L (ref 22–32)
Calcium: 8.1 mg/dL — ABNORMAL LOW (ref 8.9–10.3)
Chloride: 108 mmol/L (ref 101–111)
Creatinine, Ser: 0.94 mg/dL (ref 0.61–1.24)
Glucose, Bld: 124 mg/dL — ABNORMAL HIGH (ref 65–99)
POTASSIUM: 4.3 mmol/L (ref 3.5–5.1)
SODIUM: 135 mmol/L (ref 135–145)

## 2015-08-08 LAB — CREATININE, SERUM
Creatinine, Ser: 1.17 mg/dL (ref 0.61–1.24)
GFR calc Af Amer: >60 mL/min (ref >60)
GFR calc non Af Amer: 54 mL/min — ABNORMAL LOW (ref >60)

## 2015-08-08 MED ORDER — INSULIN ASPART 100 UNIT/ML ~~LOC~~ SOLN
0.0000 [IU] | SUBCUTANEOUS | Status: DC
  Administered 2015-08-08 – 2015-08-09 (×5): 2 [IU] via SUBCUTANEOUS

## 2015-08-08 MED ORDER — ENOXAPARIN SODIUM 30 MG/0.3ML ~~LOC~~ SOLN
30.0000 mg | SUBCUTANEOUS | Status: DC
  Administered 2015-08-08 – 2015-08-10 (×3): 30 mg via SUBCUTANEOUS
  Filled 2015-08-08 (×3): qty 0.3

## 2015-08-08 MED ORDER — FUROSEMIDE 10 MG/ML IJ SOLN
20.0000 mg | Freq: Once | INTRAMUSCULAR | Status: AC
  Administered 2015-08-08: 20 mg via INTRAVENOUS
  Filled 2015-08-08: qty 2

## 2015-08-08 MED FILL — Heparin Sodium (Porcine) Inj 1000 Unit/ML: INTRAMUSCULAR | Qty: 10 | Status: AC

## 2015-08-08 MED FILL — Mannitol IV Soln 20%: INTRAVENOUS | Qty: 500 | Status: AC

## 2015-08-08 MED FILL — Sodium Bicarbonate IV Soln 8.4%: INTRAVENOUS | Qty: 50 | Status: AC

## 2015-08-08 MED FILL — Sodium Chloride IV Soln 0.9%: INTRAVENOUS | Qty: 2000 | Status: AC

## 2015-08-08 MED FILL — Lidocaine HCl IV Inj 20 MG/ML: INTRAVENOUS | Qty: 5 | Status: AC

## 2015-08-08 MED FILL — Albumin, Human Inj 5%: INTRAVENOUS | Qty: 250 | Status: AC

## 2015-08-08 MED FILL — Electrolyte-R (PH 7.4) Solution: INTRAVENOUS | Qty: 4000 | Status: AC

## 2015-08-08 NOTE — Clinical Social Work Note (Signed)
CSW received handoff from previous unit CSW and will continue to follow patient's progress towards discharge planning.  Patient is from [facility] and he plans to return back to facility once he is medically ready and discharge orders have been received.  Jones Broom. [place], MSW, [place] 08/08/2015 11:26 PM

## 2015-08-08 NOTE — Anesthesia Postprocedure Evaluation (Signed)
  Anesthesia Post-op Note  Patient: Isaac [name]  Procedure(s) Performed: Procedure(s): CORONARY ARTERY BYPASS GRAFTING (CABG) x 3 with Endoscopic Vein Harvesting of greater saphenous vein from bilateral thighs (N/A) TRANSESOPHAGEAL ECHOCARDIOGRAM (TEE) (N/A)  Patient Location: SICU  Anesthesia Type:General  Level of Consciousness: awake, alert  and oriented  Airway and Oxygen Therapy: Patient connected to nasal cannula oxygen  Post-op Pain: mild  Post-op Assessment: Post-op Vital signs reviewed, Patient's Cardiovascular Status Stable, Respiratory Function Stable, Patent Airway, No signs of Nausea or vomiting and Adequate PO intake              Post-op Vital Signs: Reviewed and stable  Last Vitals:  Filed Vitals:   08/08/15 0900  BP: 84/55  Pulse: 85  Temp: 37.7 C  Resp: 24    Complications: No apparent anesthesia complications

## 2015-08-08 NOTE — Progress Notes (Signed)
CT surgery p.m. Rounds  Patient examined and record reviewed.Hemodynamics stable,labs satisfactory.Patient had stable day.Continue current care. Isaac [name] 08/08/2015   

## 2015-08-08 NOTE — Progress Notes (Signed)
Patient ID: Isaac [name], male   DOB: 1926-03-21, 81 y.o.   MRN: [id number] TCTS DAILY ICU PROGRESS NOTE                   [address]            [place] [address]          [phone]   1 Day Post-Op Procedure(s) (LRB): CORONARY ARTERY BYPASS GRAFTING (CABG) x 3 with Endoscopic Vein Harvesting of greater saphenous vein from bilateral thighs (N/A) TRANSESOPHAGEAL ECHOCARDIOGRAM (TEE) (N/A)  Total Length of Stay:  LOS: 6 days   Subjective: Awake and alert, complaint of incisional pain  Objective: Vital signs in last 24 hours: Temp:  [97.2 F (36.2 C)-99.9 F (37.7 C)] 99.7 F (37.6 C) (09/14 0800) Pulse Rate:  [42-88] 85 (09/14 0800) Cardiac Rhythm:  [-] A-V Sequential paced (09/14 0730) Resp:  [7-27] 16 (09/14 0800) BP: (62-144)/(45-81) 99/51 mmHg (09/14 0800) SpO2:  [90 %-100 %] 97 % (09/14 0800) Arterial Line BP: (81-156)/(41-75) 103/46 mmHg (09/14 0800) FiO2 (%):  [40 %-50 %] 40 % (09/13 2139) Weight:  [161 lb 2.5 oz (73.1 kg)] 161 lb 2.5 oz (73.1 kg) (09/14 0500)  Filed Weights   08/06/15 0415 08/07/15 0623 08/08/15 0500  Weight: 153 lb 14.1 oz (69.8 kg) 154 lb (69.854 kg) 161 lb 2.5 oz (73.1 kg)    Weight change: 7 lb 2.5 oz (3.246 kg)   Hemodynamic parameters for last 24 hours: PAP: (22-46)/(9-27) 39/27 mmHg CO:  [3 L/min-4.7 L/min] 3.6 L/min CI:  [1.7 L/min/m2-2.7 L/min/m2] 2.1 L/min/m2  Intake/Output from previous day: 09/13 0701 - 09/14 0700 In: 5358.4 [I.V.:3558.4; Blood:700; IV Piggyback:1100] Out: 1324 [Urine:1910; Blood:1000; Chest Tube:360]  Intake/Output this shift: Total I/O In: 20 [I.V.:20] Out: -   Current Meds: Scheduled Meds: . acetaminophen  1,000 mg Oral 4 times per day   Or  . acetaminophen (TYLENOL) oral liquid 160 mg/5 mL  1,000 mg Per Tube 4 times per day  . antiseptic oral rinse  7 mL Mouth Rinse 10 times per day  . aspirin EC  325 mg Oral Daily   Or  . aspirin  324 mg Per Tube Daily  . bisacodyl  10 mg Oral  Daily   Or  . bisacodyl  10 mg Rectal Daily  . cefUROXime (ZINACEF)  IV  1.5 g Intravenous Q12H  . chlorhexidine gluconate  15 mL Mouth Rinse BID  . docusate sodium  200 mg Oral Daily  . donepezil  5 mg Oral QHS  . insulin aspart  0-24 Units Subcutaneous 6 times per day  . insulin regular  0-10 Units Intravenous TID WC  . metoprolol tartrate  12.5 mg Oral BID   Or  . metoprolol tartrate  12.5 mg Per Tube BID  . [START ON 08/09/2015] pantoprazole  40 mg Oral Daily  . pravastatin  40 mg Oral QPM  . sodium chloride  3 mL Intravenous Q12H   Continuous Infusions: . sodium chloride 20 mL/hr at 08/08/15 0800  . sodium chloride    . sodium chloride 20 mL/hr at 08/08/15 0700  . dexmedetomidine Stopped (08/07/15 2030)  . insulin (NOVOLIN-R) infusion Stopped (08/08/15 0500)  . lactated ringers 20 mL/hr at 08/08/15 0700  . lactated ringers    . nitroGLYCERIN Stopped (08/07/15 1900)  . phenylephrine (NEO-SYNEPHRINE) Adult infusion 20 mcg/min (08/08/15 0700)   PRN Meds:.sodium chloride, albumin human, lactated ringers, metoprolol, midazolam, morphine injection, ondansetron (ZOFRAN) IV, oxyCODONE, sodium  chloride, traMADol  General appearance: alert, cooperative and no distress Neurologic: intact Heart: regular rate and rhythm, S1, S2 normal, no murmur, click, rub or gallop and paced Lungs: diminished breath sounds bibasilar Abdomen: soft, non-tender; bowel sounds normal; no masses,  no organomegaly Extremities: extremities normal, atraumatic, no cyanosis or edema and Homans sign is negative, no sign of DVT Wound: sdressing intact  Lab Results: CBC: Recent Labs  08/07/15 2254 08/08/15 0315  WBC 19.4* 15.5*  HGB 12.0* 11.5*  HCT 35.7* 33.7*  PLT 126* 143*   BMET:  Recent Labs  08/07/15 0342  08/07/15 2249 08/07/15 2254 08/08/15 0315  NA 136  < > 137  --  135  K 3.8  < > 4.5  --  4.3  CL 102  < > 104  --  108  CO2 27  --   --   --  23  GLUCOSE 91  < > 179*  --  124*  BUN 16   < > 13  --  13  CREATININE 1.03  < > 0.80 0.98 0.94  CALCIUM 9.0  --   --   --  8.1*  < > = values in this interval not displayed.  PT/INR:  Recent Labs  08/07/15 1705  LABPROT 18.5*  INR 1.54*   Radiology: Dg Chest Port 1 View  08/08/2015   CLINICAL DATA:  CABG.  EXAM: PORTABLE CHEST - 1 VIEW  COMPARISON:  08/07/2015.  FINDINGS: Interim extubation and removal of NG tube. Left chest tube, Swan-Ganz catheter, mediastinal drainage catheter in stable position. Prior CABG. Stable cardiomegaly. Bibasilar atelectasis and/or infiltrates. Small left pleural effusion cannot be excluded.  IMPRESSION: 1. Interim extubation removal of NG tube. Remaining lines and tubes including left chest tube in stable position. No pneumothorax preop 2. Low lung volumes with bibasilar atelectasis and/or infiltrate. Small left pleural effusion cannot be excluded. 3. Prior CABG.  Cardiomegaly.  No pulmonary venous congestion.   Electronically Signed   By: Marcello Moores  Register   On: 08/08/2015 08:04   Dg Chest Port 1 View  08/07/2015   CLINICAL DATA:  Post CABG x3.  EXAM: PORTABLE CHEST - 1 VIEW  COMPARISON:  08/01/2015  FINDINGS: Median sternotomy wires are intact. Endotracheal tube has tip approximately 4.5 cm above the carina. Right IJ Swan-Ganz catheter has tip over the main pulmonary artery segment. Nasogastric tube courses into the region of the stomach as side-port is present over the stomach in the left upper quadrant and tip is not visualized. Left-sided chest tube appears in adequate position. Mediastinal drain in place.  Lungs are hypoinflated with mild opacification in the left base likely small effusion with atelectasis. Minimal opacification over the medial right base likely atelectasis. No evidence of pneumothorax. Cardiomediastinal silhouette and remainder of the exam is unchanged.  IMPRESSION: Hypoinflation with small left effusion and mild bibasilar atelectasis.  Tubes and lines as described post CABG.    Electronically Signed   By: Marin Olp M.D.   On: 08/07/2015 17:16     Assessment/Plan: S/P Procedure(s) (LRB): CORONARY ARTERY BYPASS GRAFTING (CABG) x 3 with Endoscopic Vein Harvesting of greater saphenous vein from bilateral thighs (N/A) TRANSESOPHAGEAL ECHOCARDIOGRAM (TEE) (N/A) Mobilize Diuresis Diabetes control d/c tubes/lines Continue foley due to urinary output monitoring See progression orders  Expected Acute  Blood - loss Anemia      [name] 08/08/2015 8:07 AM

## 2015-08-08 NOTE — Evaluation (Signed)
Physical Therapy Evaluation Patient Details Name: Frederico Gerling MRN: [id number] DOB: [birth year] Today's Date: 08/08/2015   History of Present Illness  Pt presents with NSTEMI and CABG x3 and TEE. PMH of CAD, HTN, and sciatica  Clinical Impression  Pt admitted with above diagnosis. Pt currently with functional limitations due to the deficits listed below (see PT Problem List). Pt ambulated 30' pushing w/c with min A +2 to steady and mgmt of lines.  Pt will benefit from skilled PT to increase their independence and safety with mobility to allow discharge to the venue listed below.       Follow Up Recommendations SNF    Equipment Recommendations  Rolling walker with 5" wheels    Recommendations for Other Services       Precautions / Restrictions Precautions Precautions: Sternal Precaution Comments: reviewed, but pt distracted by pain today, will need to be retaught. Used heart pillow for transfers Restrictions Weight Bearing Restrictions: No      Mobility  Bed Mobility Overal bed mobility: Needs Assistance;+ 2 for safety/equipment Bed Mobility: Sit to Supine       Sit to supine: Min assist   General bed mobility comments: pt needed min A to get legs into bed and +2 for lines  Transfers Overall transfer level: Needs assistance Equipment used: Pushed w/c Transfers: Sit to/from Stand Sit to Stand: Min assist         General transfer comment: sit to stand with min A for power up without use of UE's.   Ambulation/Gait Ambulation/Gait assistance: Min assist;+2 safety/equipment Ambulation Distance (Feet): 30 Feet Assistive device:  (pushed w/c) Gait Pattern/deviations: Step-through pattern;Decreased stride length;Trunk flexed Gait velocity: decreased   General Gait Details: vc's for erect posture and controlled breathing due to pt anxiety  Stairs            Wheelchair Mobility    Modified Rankin (Stroke Patients Only)       Balance Overall balance  assessment: Needs assistance Sitting-balance support: No upper extremity supported Sitting balance-Leahy Scale: Fair     Standing balance support: Bilateral upper extremity supported Standing balance-Leahy Scale: Poor Standing balance comment: pt unsteady today, required UE asist for safety                             Pertinent Vitals/Pain Pain Assessment: Faces Faces Pain Scale: Hurts whole lot Pain Location: chest with deep breath Pain Descriptors / Indicators: Aching Pain Intervention(s): Limited activity within patient's tolerance;Monitored during session         O2 sats 96%  Home Living Family/patient expects to be discharged to:: Private residence Living Arrangements: Spouse/significant other Available Help at Discharge: [place] Type of Home: Apartment Home Access: Level entry     Home Layout: One level Home Equipment: None Additional Comments: pt lives with wife at [place] [facility] in independent living. Planning to go to healthcare (SNF) section to recover.     Prior Function Level of Independence: Independent         Comments: pt swims 3x/ week and competes     Hand Dominance        Extremity/Trunk Assessment   Upper Extremity Assessment: Defer to OT evaluation           Lower Extremity Assessment: Generalized weakness      Cervical / Trunk Assessment: Normal  Communication   Communication: HOH  Cognition Arousal/Alertness: Awake/alert Behavior During Therapy: Anxious Overall Cognitive Status: History  of cognitive impairments - at baseline       Memory: Decreased short-term memory              General Comments General comments (skin integrity, edema, etc.): pt anxious about chest pain and lines    Exercises        Assessment/Plan    PT Assessment Patient needs continued PT services  PT Diagnosis Difficulty walking;Acute pain;Generalized weakness   PT Problem List Decreased strength;Decreased  activity tolerance;Decreased balance;Decreased mobility;Decreased cognition;Decreased knowledge of precautions;Decreased knowledge of use of DME;Cardiopulmonary status limiting activity;Pain  PT Treatment Interventions DME instruction;Gait training;Functional mobility training;Therapeutic activities;Therapeutic exercise;Balance training;Patient/family education   PT Goals (Current goals can be found in the Care Plan section) Acute Rehab PT Goals Patient Stated Goal: return to swimming PT Goal Formulation: With patient Time For Goal Achievement: 08/22/15 Potential to Achieve Goals: Good    Frequency Min 3X/week   Barriers to discharge        Co-evaluation               End of Session Equipment Utilized During Treatment: Gait belt;Oxygen Activity Tolerance: Patient tolerated treatment well Patient left: in bed;with call bell/phone within reach Nurse Communication: Mobility status         Time: 1121-6244 PT Time Calculation (min) (ACUTE ONLY): 31 min   Charges:   PT Evaluation $Initial PT Evaluation Tier I: 1 Procedure PT Treatments $Gait Training: 8-22 mins   PT G Codes:      Leighton Roach, PT  Acute Rehab Services  [place], [place] 08/08/2015, 3:55 PM

## 2015-08-09 ENCOUNTER — Inpatient Hospital Stay (HOSPITAL_COMMUNITY): Payer: Medicare Other

## 2015-08-09 LAB — BASIC METABOLIC PANEL
Anion gap: 5 (ref 5–15)
BUN: 18 mg/dL (ref 6–20)
CO2: 24 mmol/L (ref 22–32)
Calcium: 8.3 mg/dL — ABNORMAL LOW (ref 8.9–10.3)
Chloride: 104 mmol/L (ref 101–111)
Creatinine, Ser: 1.09 mg/dL (ref 0.61–1.24)
GFR calc Af Amer: >60 mL/min (ref >60)
GFR calc non Af Amer: 59 mL/min — ABNORMAL LOW (ref >60)
Glucose, Bld: 126 mg/dL — ABNORMAL HIGH (ref 65–99)
Potassium: 4 mmol/L (ref 3.5–5.1)
Sodium: 133 mmol/L — ABNORMAL LOW (ref 135–145)

## 2015-08-09 LAB — GLUCOSE, CAPILLARY
GLUCOSE-CAPILLARY: 105 mg/dL — AB (ref 65–99)
GLUCOSE-CAPILLARY: 123 mg/dL — AB (ref 65–99)
GLUCOSE-CAPILLARY: 131 mg/dL — AB (ref 65–99)
Glucose-Capillary: 119 mg/dL — ABNORMAL HIGH (ref 65–99)
Glucose-Capillary: 121 mg/dL — ABNORMAL HIGH (ref 65–99)

## 2015-08-09 LAB — CBC
HCT: 32.6 % — ABNORMAL LOW (ref 39.0–52.0)
Hemoglobin: 11 g/dL — ABNORMAL LOW (ref 13.0–17.0)
MCH: 30.5 pg (ref 26.0–34.0)
MCHC: 33.7 g/dL (ref 30.0–36.0)
MCV: 90.3 fL (ref 78.0–100.0)
Platelets: 144 10*3/uL — ABNORMAL LOW (ref 150–400)
RBC: 3.61 MIL/uL — ABNORMAL LOW (ref 4.22–5.81)
RDW: 14.1 % (ref 11.5–15.5)
WBC: 17.9 10*3/uL — ABNORMAL HIGH (ref 4.0–10.5)

## 2015-08-09 MED ORDER — ALUM & MAG HYDROXIDE-SIMETH 200-200-20 MG/5ML PO SUSP
15.0000 mL | ORAL | Status: DC | PRN
Start: 2015-08-09 — End: 2015-08-11

## 2015-08-09 MED ORDER — SODIUM CHLORIDE 0.9 % IJ SOLN: 3.0000 mL | INTRAMUSCULAR | Status: DC | PRN

## 2015-08-09 MED ORDER — CETYLPYRIDINIUM CHLORIDE 0.05 % MT LIQD
7.0000 mL | Freq: Two times a day (BID) | OROMUCOSAL | Status: DC
  Administered 2015-08-09 – 2015-08-10 (×4): 7 mL via OROMUCOSAL

## 2015-08-09 MED ORDER — PANTOPRAZOLE SODIUM 40 MG PO TBEC
40.0000 mg | DELAYED_RELEASE_TABLET | Freq: Every day | ORAL | Status: DC
  Administered 2015-08-10 – 2015-08-11 (×2): 40 mg via ORAL
  Filled 2015-08-09 (×2): qty 1

## 2015-08-09 MED ORDER — OXYCODONE HCL 5 MG PO TABS: 5.0000 mg | ORAL_TABLET | ORAL | Status: DC | PRN

## 2015-08-09 MED ORDER — MOVING RIGHT ALONG BOOK
Freq: Once | Status: AC
  Administered 2015-08-09: 21:00:00
  Filled 2015-08-09: qty 1

## 2015-08-09 MED ORDER — TRAMADOL HCL 50 MG PO TABS: 50.0000 mg | ORAL_TABLET | ORAL | Status: DC | PRN

## 2015-08-09 MED ORDER — METOPROLOL TARTRATE 12.5 MG HALF TABLET
12.5000 mg | ORAL_TABLET | Freq: Two times a day (BID) | ORAL | Status: DC
  Administered 2015-08-10 – 2015-08-11 (×3): 12.5 mg via ORAL
  Filled 2015-08-09 (×3): qty 1

## 2015-08-09 MED ORDER — SODIUM CHLORIDE 0.9 % IV SOLN: 250.0000 mL | INTRAVENOUS | Status: DC | PRN

## 2015-08-09 MED ORDER — BISACODYL 5 MG PO TBEC: 10.0000 mg | DELAYED_RELEASE_TABLET | Freq: Every day | ORAL | Status: DC | PRN

## 2015-08-09 MED ORDER — INSULIN ASPART 100 UNIT/ML ~~LOC~~ SOLN
0.0000 [IU] | Freq: Three times a day (TID) | SUBCUTANEOUS | Status: DC
  Administered 2015-08-09: 2 [IU] via SUBCUTANEOUS

## 2015-08-09 MED ORDER — BISACODYL 10 MG RE SUPP: 10.0000 mg | Freq: Every day | RECTAL | Status: DC | PRN

## 2015-08-09 MED ORDER — SODIUM CHLORIDE 0.9 % IJ SOLN
3.0000 mL | Freq: Two times a day (BID) | INTRAMUSCULAR | Status: DC
  Administered 2015-08-09 – 2015-08-11 (×4): 3 mL via INTRAVENOUS

## 2015-08-09 MED ORDER — DOCUSATE SODIUM 100 MG PO CAPS: 200.0000 mg | ORAL_CAPSULE | Freq: Every day | ORAL | Status: DC

## 2015-08-09 MED ORDER — ONDANSETRON HCL 4 MG PO TABS: 4.0000 mg | ORAL_TABLET | Freq: Four times a day (QID) | ORAL | Status: DC | PRN

## 2015-08-09 MED ORDER — ONDANSETRON HCL 4 MG/2ML IJ SOLN: 4.0000 mg | Freq: Four times a day (QID) | INTRAMUSCULAR | Status: DC | PRN

## 2015-08-09 MED FILL — Magnesium Sulfate Inj 50%: INTRAMUSCULAR | Qty: 10 | Status: AC

## 2015-08-09 MED FILL — Potassium Chloride Inj 2 mEq/ML: INTRAVENOUS | Qty: 40 | Status: AC

## 2015-08-09 MED FILL — Heparin Sodium (Porcine) Inj 1000 Unit/ML: INTRAMUSCULAR | Qty: 30 | Status: AC

## 2015-08-09 NOTE — Progress Notes (Signed)
Physical Therapy Treatment Patient Details Name: Isaac [name] MRN: [id number] DOB: [birth year] Today's Date: 08/09/2015    History of Present Illness Pt presents with NSTEMI and CABG x3 and TEE. PMH of CAD, HTN, and sciatica, HOH     PT Comments    Patient mobilizing better, however continues to need cues and assist to maintain upright posture and proper proximity to RW. Patient continues to require education regarding safe and proper use of DME.   Follow Up Recommendations  SNF (At [facility])     Equipment Recommendations  Rolling walker with 5" wheels    Recommendations for Other Services       Precautions / Restrictions Precautions Precautions: Sternal Precaution Comments: Patient able to recall precautions, however requires cues to maintain precautions with functional mobility.  Restrictions Weight Bearing Restrictions: No    Mobility  Bed Mobility Overal bed mobility: Needs Assistance;+ 2 for safety/equipment Bed Mobility: Sit to Sidelying         Sit to sidelying: Min assist;+2 for safety/equipment General bed mobility comments: pt needed min A to get legs into bed and +2 for lines  Transfers Overall transfer level: Needs assistance Equipment used: Rolling walker (2 wheeled) Transfers: Sit to/from Stand Sit to Stand: Min guard         General transfer comment: hands-on guarding assist for unsteadiness, however able to rise without assist  Ambulation/Gait Ambulation/Gait assistance: Min assist;+2 safety/equipment Ambulation Distance (Feet): 400 Feet Assistive device: Rolling walker (2 wheeled) (pushed w/c) Gait Pattern/deviations: Step-through pattern;Decreased step length - left;Trunk flexed Gait velocity: decreased   General Gait Details: vc's for erect posture; assist to maneuver RW at times   Stairs            Wheelchair Mobility    Modified Rankin (Stroke Patients Only)       Balance     Sitting balance-Leahy Scale: Fair      Standing balance support: No upper extremity supported Standing balance-Leahy Scale: Fair                      Cognition Arousal/Alertness: Awake/alert Behavior During Therapy: WFL for tasks assessed/performed Overall Cognitive Status: History of cognitive impairments - at baseline       Memory: Decreased short-term memory              Exercises      General Comments        Pertinent Vitals/Pain HR 90-92 throughout SaO2 on 2L 95%   Pain Assessment: 0-10 Pain Score: 5  Pain Location: chest Pain Descriptors / Indicators: Aching Pain Intervention(s): Limited activity within patient's tolerance;Monitored during session;Repositioned    Home Living                      Prior Function            PT Goals (current goals can now be found in the care plan section) Acute Rehab PT Goals Patient Stated Goal: return to swimming PT Goal Formulation: With patient Time For Goal Achievement: 08/22/15 Potential to Achieve Goals: Good Progress towards PT goals: Progressing toward goals    Frequency  Min 3X/week    PT Plan Current plan remains appropriate    Co-evaluation             End of Session Equipment Utilized During Treatment: Gait belt;Oxygen Activity Tolerance: Patient tolerated treatment well Patient left: in bed;with call bell/phone within reach;with family/visitor present;with SCD's reapplied  Time: 1005-1031 PT Time Calculation (min) (ACUTE ONLY): 26 min  Charges:  $Gait Training: 23-37 mins                    G Codes:      Logan Vegh 03-Sep-2015, 11:37 AM Pager [phone]

## 2015-08-09 NOTE — Progress Notes (Signed)
Report called to Isaac Bruckner RN. Pt to transfer to 2W-23 via wheelchair. Meds in chart, belongings at bedside, VS stable at time of transfer. Wife, Isaac [name], notified of room change. No questions or complaints at this time. Bedside handoff to 2W RN Sharrie Rothman. Elliot Meldrum L

## 2015-08-09 NOTE — Progress Notes (Signed)
Patient ID: [name], male   DOB: Mar 08, 1926, 81 y.o.   MRN: [id number] TCTS DAILY ICU PROGRESS NOTE                   [address]            [place] [address]          [phone]   2 Days Post-Op Procedure(s) (LRB): CORONARY ARTERY BYPASS GRAFTING (CABG) x 3 with Endoscopic Vein Harvesting of greater saphenous vein from bilateral thighs (N/A) TRANSESOPHAGEAL ECHOCARDIOGRAM (TEE) (N/A)  Total Length of Stay:  LOS: 7 days   Subjective: Feels better today  Objective: Vital signs in last 24 hours: Temp:  [97.5 F (36.4 C)-99.9 F (37.7 C)] 98.3 F (36.8 C) (09/15 0751) Pulse Rate:  [57-95] 65 (09/15 0800) Cardiac Rhythm:  [-] Sinus bradycardia;Heart block (09/15 0800) Resp:  [12-24] 16 (09/15 0800) BP: (84-123)/(40-60) 117/55 mmHg (09/15 0800) SpO2:  [89 %-99 %] 97 % (09/15 0800) Arterial Line BP: (79-132)/(29-56) 125/55 mmHg (09/15 0800) Weight:  [166 lb 7.2 oz (75.5 kg)] 166 lb 7.2 oz (75.5 kg) (09/15 0500)  Filed Weights   08/07/15 0623 08/08/15 0500 08/09/15 0500  Weight: 154 lb (69.854 kg) 161 lb 2.5 oz (73.1 kg) 166 lb 7.2 oz (75.5 kg)    Weight change: 5 lb 4.7 oz (2.4 kg)   Hemodynamic parameters for last 24 hours: PAP: (37)/(25) 37/25 mmHg  Intake/Output from previous day: 09/14 0701 - 09/15 0700 In: 1249.3 [P.O.:480; I.V.:669.3; IV Piggyback:100] Out: 1195 [Urine:1035; Chest Tube:160]  Intake/Output this shift: Total I/O In: 35 [I.V.:35] Out: 70 [Urine:50; Chest Tube:20]  Current Meds: Scheduled Meds: . acetaminophen  1,000 mg Oral 4 times per day   Or  . acetaminophen (TYLENOL) oral liquid 160 mg/5 mL  1,000 mg Per Tube 4 times per day  . antiseptic oral rinse  7 mL Mouth Rinse 10 times per day  . aspirin EC  325 mg Oral Daily   Or  . aspirin  324 mg Per Tube Daily  . bisacodyl  10 mg Oral Daily   Or  . bisacodyl  10 mg Rectal Daily  . cefUROXime (ZINACEF)  IV  1.5 g Intravenous Q12H  . chlorhexidine gluconate  15 mL Mouth  Rinse BID  . docusate sodium  200 mg Oral Daily  . donepezil  5 mg Oral QHS  . enoxaparin (LOVENOX) injection  30 mg Subcutaneous Q24H  . insulin aspart  0-24 Units Subcutaneous 6 times per day  . insulin aspart  0-24 Units Subcutaneous 6 times per day  . metoprolol tartrate  12.5 mg Oral BID   Or  . metoprolol tartrate  12.5 mg Per Tube BID  . pantoprazole  40 mg Oral Daily  . pravastatin  40 mg Oral QPM  . sodium chloride  3 mL Intravenous Q12H   Continuous Infusions: . sodium chloride 20 mL/hr at 08/08/15 0800  . sodium chloride    . sodium chloride 20 mL/hr at 08/08/15 0700  . dexmedetomidine Stopped (08/07/15 2030)  . lactated ringers 20 mL/hr at 08/08/15 2231  . lactated ringers    . nitroGLYCERIN Stopped (08/07/15 1900)  . phenylephrine (NEO-SYNEPHRINE) Adult infusion 20 mcg/min (08/09/15 0600)   PRN Meds:.sodium chloride, lactated ringers, metoprolol, midazolam, morphine injection, ondansetron (ZOFRAN) IV, oxyCODONE, sodium chloride, traMADol  General appearance: alert, cooperative and no distress Neurologic: intact Heart: regular rate and rhythm, S1, S2 normal, no murmur, click, rub or gallop Lungs: diminished  breath sounds bibasilar Abdomen: soft, non-tender; bowel sounds normal; no masses,  no organomegaly Extremities: extremities normal, atraumatic, no cyanosis or edema and Homans sign is negative, no sign of DVT Wound: sternum stable dressing intact  Lab Results: CBC: Recent Labs  08/08/15 1712 08/09/15 0315  WBC 18.9* 17.9*  HGB 11.5* 11.0*  HCT 33.6* 32.6*  PLT 141* 144*   BMET:  Recent Labs  08/08/15 0315 08/08/15 1707 08/08/15 1712 08/09/15 0315  NA 135 134*  --  133*  K 4.3 4.2  --  4.0  CL 108 102  --  104  CO2 23  --   --  24  GLUCOSE 124* 159*  --  126*  BUN 13 18  --  18  CREATININE 0.94 1.00 1.17 1.09  CALCIUM 8.1*  --   --  8.3*    PT/INR:  Recent Labs  08/07/15 1705  LABPROT 18.5*  INR 1.54*   Radiology: Dg Chest Port 1  View  08/09/2015   CLINICAL DATA:  Post CABG  EXAM: PORTABLE CHEST - 1 VIEW  COMPARISON:  Portable chest x-ray of 08/08/2015  FINDINGS: The Swan-Ganz catheter has been removed and a venous sheath remains in the SVC. No pneumothorax is seen. Bibasilar opacities remain left-greater-than-right most consistent with atelectasis, effusions, but pneumonia cannot be excluded particularly at the left lung base. A left chest tube remains and there is a tiny left apical pneumothorax noted. Cardiomegaly is stable.  IMPRESSION: 1. Left chest tube remains with tiny left apical pneumothorax present. 2. Swan-Ganz catheter removed. 3. Bibasilar opacities remain left-greater-than-right consistent with atelectasis, effusions, but pneumonia cannot be excluded.   Electronically Signed   By: Ivar Drape M.D.   On: 08/09/2015 08:04     Assessment/Plan: S/P Procedure(s) (LRB): CORONARY ARTERY BYPASS GRAFTING (CABG) x 3 with Endoscopic Vein Harvesting of greater saphenous vein from bilateral thighs (N/A) TRANSESOPHAGEAL ECHOCARDIOGRAM (TEE) (N/A) Mobilize Diuresis Plan for transfer to step-down: see transfer orders See progression orders Renal function stable    [name] 08/09/2015 8:59 AM

## 2015-08-09 NOTE — Care Management Important Message (Signed)
Important Message  Patient Details  Name: Isaac [name] MRN: [id number] Date of Birth: 1926-01-16   Medicare Important Message Given:  Yes-third notification given    Nathen May 08/09/2015, 3:41 PM

## 2015-08-10 ENCOUNTER — Inpatient Hospital Stay (HOSPITAL_COMMUNITY): Payer: Medicare Other

## 2015-08-10 LAB — CBC
HCT: 33.7 % — ABNORMAL LOW (ref 39.0–52.0)
Hemoglobin: 11.2 g/dL — ABNORMAL LOW (ref 13.0–17.0)
MCH: 30.4 pg (ref 26.0–34.0)
MCHC: 33.2 g/dL (ref 30.0–36.0)
MCV: 91.6 fL (ref 78.0–100.0)
Platelets: 136 10*3/uL — ABNORMAL LOW (ref 150–400)
RBC: 3.68 MIL/uL — ABNORMAL LOW (ref 4.22–5.81)
RDW: 14.3 % (ref 11.5–15.5)
WBC: 10.2 10*3/uL (ref 4.0–10.5)

## 2015-08-10 LAB — BASIC METABOLIC PANEL
Anion gap: 5 (ref 5–15)
BUN: 26 mg/dL — ABNORMAL HIGH (ref 6–20)
CO2: 27 mmol/L (ref 22–32)
Calcium: 8.5 mg/dL — ABNORMAL LOW (ref 8.9–10.3)
Chloride: 103 mmol/L (ref 101–111)
Creatinine, Ser: 0.96 mg/dL (ref 0.61–1.24)
GFR calc Af Amer: >60 mL/min (ref >60)
GFR calc non Af Amer: >60 mL/min (ref >60)
Glucose, Bld: 115 mg/dL — ABNORMAL HIGH (ref 65–99)
Potassium: 3.9 mmol/L (ref 3.5–5.1)
Sodium: 135 mmol/L (ref 135–145)

## 2015-08-10 LAB — GLUCOSE, CAPILLARY
GLUCOSE-CAPILLARY: 103 mg/dL — AB (ref 65–99)
GLUCOSE-CAPILLARY: 121 mg/dL — AB (ref 65–99)
GLUCOSE-CAPILLARY: 107 mg/dL — AB (ref 65–99)
GLUCOSE-CAPILLARY: 104 mg/dL — AB (ref 65–99)

## 2015-08-10 MED ORDER — FUROSEMIDE 10 MG/ML IJ SOLN
40.0000 mg | Freq: Once | INTRAMUSCULAR | Status: AC
  Administered 2015-08-10: 40 mg via INTRAVENOUS
  Filled 2015-08-10: qty 4

## 2015-08-10 MED ORDER — POTASSIUM CHLORIDE CRYS ER 20 MEQ PO TBCR
20.0000 meq | EXTENDED_RELEASE_TABLET | Freq: Every day | ORAL | Status: DC
  Administered 2015-08-10 – 2015-08-11 (×2): 20 meq via ORAL
  Filled 2015-08-10 (×2): qty 1

## 2015-08-10 NOTE — Clinical Social Work Note (Signed)
CSW spoke with [facility] admission coordinator, who said they can take patient once he is ready for discharge.  CSW informed patient and his wife that SNF can take him over the weekend.  CSW to continue to follow patient's progress throughout discharge planning.  Jones Broom. [place]edar, MSW, [place] 08/10/2015 5:00 PM

## 2015-08-10 NOTE — Progress Notes (Signed)
Ed completed with pt and wife. Voiced understanding and requests his name be sent to [place]. [place], ACSM 3:32 PM 08/10/2015

## 2015-08-10 NOTE — Discharge Instructions (Signed)
Activity: 1.May walk up steps °               2.No lifting more than ten pounds for four weeks.  °               3.No driving for four weeks. °               4.Stop any activity that causes chest pain, shortness of breath, dizziness, sweating or excessive weakness. °               5.Avoid straining. °               6.Continue with your breathing exercises daily. ° °Diet: Diabetic diet and Low fat, Low salt diet ° °Wound Care: May shower.  Clean wounds with mild soap and water daily. Contact the office at [phone] if any problems arise. ° °Coronary Artery Bypass Grafting, Care After °Refer to this sheet in the next few weeks. These instructions provide you with information on caring for yourself after your procedure. Your health care provider may also give you more specific instructions. Your treatment has been planned according to current medical practices, but problems sometimes occur. Call your health care provider if you have any problems or questions after your procedure. °WHAT TO EXPECT AFTER THE PROCEDURE °Recovery from surgery will be different for everyone. Some people feel well after 3 or 4 weeks, while for others it takes longer. After your procedure, it is typical to have the following: °· Nausea and a lack of appetite.   °· Constipation. °· Weakness and fatigue.   °· Depression or irritability.   °· Pain or discomfort at your incision site. °HOME CARE INSTRUCTIONS °· Take medicines only as directed by your health care provider. Do not stop taking medicines or start any new medicines without first checking with your health care provider. °· Take your pulse as directed by your health care provider. °· Perform deep breathing as directed by your health care provider. If you were given a device called an incentive spirometer, use it to practice deep breathing several times a day. Support your chest with a pillow or your arms when you take deep breaths or cough. °· Keep incision areas clean, dry, and  protected. Remove or change any bandages (dressings) only as directed by your health care provider. You may have skin adhesive strips over the incision areas. Do not take the strips off. They will fall off on their own. °· Check incision areas daily for any swelling, redness, or drainage. °· If incisions were made in your legs, do the following: °¨ Avoid crossing your legs.   °¨ Avoid sitting for long periods of time. Change positions every 30 minutes.   °¨ Elevate your legs when you are sitting. °· Wear compression stockings as directed by your health care provider. These stockings help keep blood clots from forming in your legs. °· Take showers once your health care provider approves. Until then, only take sponge baths. Pat incisions dry. Do not rub incisions with a washcloth or towel. Do not take baths, swim, or use a hot tub until your health care provider approves. °· Eat foods that are high in fiber, such as raw fruits and vegetables, whole grains, beans, and nuts. Meats should be lean cut. Avoid canned, processed, and fried foods. °· Drink enough fluid to keep your urine clear or pale yellow. °· Weigh yourself every day. This helps identify if you are retaining fluid that may make your heart and lungs   work harder. °· Rest and limit activity as directed by your health care provider. You may be instructed to: °¨ Stop any activity at once if you have chest pain, shortness of breath, irregular heartbeats, or dizziness. Get help right away if you have any of these symptoms. °¨ Move around frequently for short periods or take short walks as directed by your health care provider. Increase your activities gradually. You may need physical therapy or cardiac rehabilitation to help strengthen your muscles and build your endurance. °¨ Avoid lifting, pushing, or pulling anything heavier than 10 lb (4.5 kg) for at least 6 weeks after surgery. °· Do not drive until your health care provider approves.  °· Ask your health  care provider when you may return to work. °· Ask your health care provider when you may resume sexual activity. °· Keep all follow-up visits as directed by your health care provider. This is important. °SEEK MEDICAL CARE IF: °· You have swelling, redness, increasing pain, or drainage at the site of an incision. °· You have a fever. °· You have swelling in your ankles or legs. °· You have pain in your legs.   °· You gain 2 or more pounds (0.9 kg) a day. °· You are nauseous or vomit. °· You have diarrhea.  °SEEK IMMEDIATE MEDICAL CARE IF: °· You have chest pain that goes to your jaw or arms. °· You have shortness of breath.   °· You have a fast or irregular heartbeat.   °· You notice a "clicking" in your breastbone (sternum) when you move.   °· You have numbness or weakness in your arms or legs. °· You feel dizzy or light-headed.   °MAKE SURE YOU: °· Understand these instructions. °· Will watch your condition. °· Will get help right away if you are not doing well or get worse. °Document Released: 05/30/2005 Document Revised: 03/27/2014 Document Reviewed: 04/19/2013 °ExitCare® Patient Information ©2015 [employer]. This information is not intended to replace advice given to you by your health care provider. Make sure you discuss any questions you have with your health care provider. ° ° ° °

## 2015-08-10 NOTE — Discharge Summary (Signed)
Physician Discharge Summary       [address]       [place] [address]             [phone]    Patient ID: Isaac [name] MRN: [id number] DOB/AGE: 31-May-1926 81 y.o.  Admit date: 08/01/2015 Discharge date: 08/11/2015   Admission Diagnoses: 1. History of CAD (s/p PCI with stents 05') 2. S/p NSTEMI 3. History of hypertension 4. History of hyperlipidemia   Discharge Diagnoses:  1. History of CAD (s/p PCI with stents 05') 2. S/p NSTEMI 3. History of hypertension 4. History of hyperlipidemia 5. Mild thrombocytopenia 6. Mild ABL anemia   Procedure (s):  1.Cardiac catheterization done by Dr. Martinique on 08/03/2015:  Mid RCA to Dist RCA lesion, 15% stenosed. The lesion was previously treated with a stent (unknown type) greater than two years ago.  Prox LAD lesion, 30% stenosed.  Ost 1st Diag to 1st Diag lesion, 99% stenosed.  Ost Cx to Prox Cx lesion, 95% stenosed.  Prox Cx lesion, 95% stenosed.  There is severe left ventricular systolic dysfunction.  Ost LAD lesion, 65% stenosed.  1. Severe 2 vessel obstructive CAD  -Ostial LAD stenosis is moderate with significantly abnormal FFR of 0.75  -Critical proximal LCx stenosis with tandem lesions involving take off of large OM branch  - patent stent in mid RCA  2. Normal LV function.  Plan: Would consider CABG. The patient is 81 but otherwise in good health and still swims competitively. The LCx lesion was attempted previously in [place] and the lesion could not be crossed with a balloon.   2.Coronary artery bypass grafting x3 with left internal mammary to the left anterior descending coronary artery and sequential reverse right greater saphenous vein to the obtuse marginal distal circumflex with endovein harvesting by Dr. Servando Snare on 08/07/2015.    History of Presenting Illness: Isaac [name] is an 81 yo white male with known history of CAD. He is S/P MI x2 with PCI with stent placement x 2 done  in 2005. He also has a history of HTN and Hyperlipidemia. The patient is very active stating his swims competitively and works out several times per week. He presented to the ED on 08/01/2015 with complaints of neck and throat pain. The patient states he went swimming early that morning. However about 1 hour after swimming he developed neck and throat pain. His wife contact EMS. Upon evaluation BP was elevated at 202/100. He was treated with baby ASA and brought to the ED. Workup in the ED showed no significant EKG changes. His initial Troponin was also negative. It was felt he should be observed due to hypertension and to rule out MI. After admission his second Troponin came back elevated. He was ruled in for MI. Due to his history it was felt he would require cardiac catheterization. This was done on 08/03/2015 and revealed a patent stent in the MID RCA and critical 2 Vessel CAD. It was felt he would require CABG as previously attempted balloon angioplasty in past was unsuccessful. He currently is chest pain free. He states other than his sore throat he had no other symptoms. He does not smoke and feels he is in pretty good shape.  Dr. Servando Snare was consulted for consideration of coronary artery bypass grafting surgery. Potential risks, benefits, and complications were discussed with patient and he agreed to proceed with surgery. Pre operative carotid duplex showed no significant internal carotid artery stenosis bilaterally. He underwent a CABG x 3  on 79/13/2016.   Brief Hospital Course:  The patient was extubated late the evening of surgery without difficulty. He remained afebrile and hemodynamically stable. Gordy Councilman, a line, chest tubes, and foley were removed early in the post operative course. Lopressor was started and titrated accordingly. He was volume over loaded and diuresed. He had ABL anemia. He did not require a post op transfusion. His last H and H was 11.2 and 33.7. He also had  mild thrombocytopenia. He was weaned off the insulin drip. The patient's glucose remained well controlled. The patient's HGA1C pre op was 5.7. He is pre diabetic and will require further surveillance of his HGA1C by his medical doctor as an outpatient. .  The patient was felt surgically stable for transfer from the ICU to [facility] for further convalescence on 07/2015. He continues to progress with cardiac rehab. He was ambulating on room air. He has been tolerating a diet and has had a bowel movement. Epicardial pacing wires and chest tube sutures will be removed prior to discharge. The patient resides at [facility] and will be discharged to their skilled nursing facility for further rehab prior to returning to independent living, and the social worker is arranging transfer. The patient is felt surgically stable for discharge today.   Latest Vital Signs: Blood pressure 118/60, pulse 94, temperature 98.7 F (37.1 C), temperature source Oral, resp. rate 18, height 5\' 5"  (1.651 m), weight 162 lb 11.2 oz (73.8 kg), SpO2 92 %.  Physical Exam: General appearance: alert, cooperative and no distress Heart: regular rate and rhythm Lungs: clear to auscultation bilaterally Abdomen: soft, non-tender; bowel sounds normal; no masses, no organomegaly Extremities: edema trace Wound: clean and dry   Discharge Instructions: 1. Please obtain vital signs at least one time daily 2. Please weigh the patient daily. If he or she continues to gain weight or develops lower extremity edema, contact the office at [phone]. 3. Ambulate patient at least three times daily and please use sternal precautions. 4. May shower daily and clean incisions with soap and water. 5. Please continue a heart healthy diet.    Discharge Condition:Stable and discharged to home  Recent laboratory studies:  Lab Results  Component Value Date   WBC 10.2 08/10/2015   HGB 11.2* 08/10/2015   HCT 33.7* 08/10/2015   MCV 91.6 08/10/2015    PLT 136* 08/10/2015   Lab Results  Component Value Date   NA 135 08/10/2015   K 3.9 08/10/2015   CL 103 08/10/2015   CO2 27 08/10/2015   CREATININE 0.96 08/10/2015   GLUCOSE 115* 08/10/2015    Diagnostic Studies: Dg Chest 2 View  08/10/2015   CLINICAL DATA:  Postop CABG.  Chest tube removal.  EXAM: CHEST  2 VIEW  COMPARISON:  08/09/2015 and earlier.  FINDINGS: Sternotomy for CABG. Cardiac silhouette mildly to moderately enlarged. Left chest tube removal with no pneumothorax. Atelectasis in the lower lobes and lingula with improved aeration since yesterday. No new pulmonary parenchymal abnormalities. Pulmonary vascularity normal without evidence pulmonary edema.  IMPRESSION: 1. No pneumothorax after left chest tube removal. 2. Atelectasis in the lower lobes and lingula with improved aeration since yesterday. 3. No new abnormalities.   Electronically Signed   By: Evangeline Dakin M.D.   On: 08/10/2015 08:09    Discharge Medications:   Medication List    STOP taking these medications        lisinopril 20 MG tablet  Commonly known as:  PRINIVIL,ZESTRIL  metoprolol succinate 25 MG 24 hr tablet  Commonly known as:  TOPROL-XL      TAKE these medications        aspirin EC 81 MG tablet  Take 81 mg by mouth daily.     donepezil 5 MG tablet  Commonly known as:  ARICEPT  Take 5 mg by mouth at bedtime.     furosemide 40 MG tablet  Commonly known as:  LASIX  Take 1 tablet (40 mg total) by mouth daily. X 5 days     glucosamine-chondroitin 500-400 MG tablet  Take 1 tablet by mouth daily.     KRILL OIL PLUS PO  Take 1 capsule by mouth daily.     metoprolol tartrate 25 MG tablet  Commonly known as:  LOPRESSOR  Take 0.5 tablets (12.5 mg total) by mouth 2 (two) times daily.     multivitamin with minerals Tabs tablet  Take 1 tablet by mouth 2 (two) times daily.     potassium chloride SA 20 MEQ tablet  Commonly known as:  K-DUR,KLOR-CON  Take 1 tablet (20 mEq total) by mouth  daily. X 5 days     pravastatin 40 MG tablet  Commonly known as:  PRAVACHOL  Take 40 mg by mouth every evening.     PROBIOTIC PO  Take 1 tablet by mouth daily.     traMADol 50 MG tablet  Commonly known as:  ULTRAM  Take 1 tablet (50 mg total) by mouth every 4 (four) hours as needed for moderate pain.     Turmeric 500 MG Caps  Take 500 mg by mouth daily.         The patient has been discharged on:   1.Beta Blocker:  Yes [x ]                              No   [   ]                              If No, reason:  2.Ace Inhibitor/ARB: Yes [   ]                                     No  [  x  ]                                     If No, reason: Low systolic BPs  3.Statin:   Yes [ x  ]                  No  [   ]                  If No, reason:  4.Ecasa:  Yes  [ x  ]                  No   [   ]                  If No, reason:    Follow Up Appointments: Follow-up Information    Follow up with [name], MD On 09/13/2015.   Specialty:  Cardiothoracic Surgery   Why:  PA/LAT CXR to be  taken (at [facility] which is in the same building as Dr. Everrett Coombe office) on 09/13/2015 at  ;Appointment time is at   Contact information:   [address] [place] [address] [phone]       Follow up with  Melinda Crutch, MD.   Specialty:  Family Medicine   Why:  Call for a follow up appointment regarding further surveillance of HGA1C 5.7 (pre diabetes)   Contact information:   [place] [address] [phone]       Follow up with Richardson Dopp, PA-C On 08/30/2015.   Specialties:  Physician Assistant, Radiology, Interventional Cardiology   Why:  Appointment with cardiology PA is at 1:30   Contact information:   [address] [place] [address] [phone]       Signed: Suzzanne Cloud HPA-C 08/11/2015, 10:13 AM

## 2015-08-10 NOTE — Op Note (Signed)
NAMEMarland Kitchen  YER, OLIVENCIA NO.:  [id number]  MEDICAL RECORD NO.:  [id number]  LOCATION:  2W23C                        FACILITY:  [facility]  PHYSICIAN:  Lanelle Bal, MD    DATE OF BIRTH:  December 08, 1925  DATE OF PROCEDURE:  08/07/2015 DATE OF DISCHARGE:                              OPERATIVE REPORT   PREOPERATIVE DIAGNOSES:  New onset of unstable angina with non-ST segment elevation myocardial infarction.  POSTOPERATIVE DIAGNOSES:  New onset of unstable angina with non-ST segment elevation myocardial infarction.  SURGICAL PROCEDURE:  Coronary artery bypass grafting x3 with left internal mammary to the left anterior descending coronary artery and sequential reverse right greater saphenous vein to the obtuse marginal distal circumflex with endovein harvesting.  SURGEON:  Lanelle Bal, MD  FIRST ASSISTANT:  Lars Pinks, PA  BRIEF HISTORY:  The patient is an 81 year old male who has been active swimming on a regular basis with known coronary occlusive disease, having stents placed at an outside hospital 10 years previously, presents with new onset of shortness of breath with minimal exertion and "sore throat."  The patient sought medical attention.  Troponins were positive for non-STEMI.  The patient was admitted and underwent cardiac catheterization.  This demonstrated LAD disease of at least 70% and also high-grade proximal circumflex with disease in the obtuse marginal because previously in [facility], the attempts at dilating the circumflex several years before were unsuccessful and the patient was treated medically, now with repeat symptoms and high-grade stenosis. Coronary artery bypass grafting was recommended to the patient.  Risks and options were discussed with the patient in detail.  He was agreeable and signed informed consent.  DESCRIPTION OF PROCEDURE:  With Swan-Ganz and arterial line monitors in place, the patient underwent general  endotracheal anesthesia without incident.  The skin of chest and legs was prepped with Betadine and draped in usual sterile manner.  Initially after appropriate time-out, we proceeded with harvesting of the left thigh vein endoscopically. Segment of this vein was removed; however, was of poor quality and too small to use.  We then moved to the right leg and the right thigh greater saphenous was harvested endoscopically and was of good quality. Median sternotomy was performed.  Left internal mammary artery was dissected down as a pedicle graft.  The distal artery was divided and had good free flow.  The pericardium was opened.  The overall ventricular function appeared preserved.  The patient was systemically heparinized.  Ascending aorta was cannulated.  The right atrium was cannulated and aortic root vent cardioplegia needle was introduced into the ascending aorta.  The patient was placed on cardiopulmonary bypass 2.4 L/min/m2.  Sites of anastomosis were selected, dissected out the epicardium.  The patient's body temperature was cooled to 32 degrees. Aortic crossclamp was applied, 500 mL of cold blood potassium cardioplegia was administered with diastolic arrest of the heart. Myocardial septal temperature was monitored throughout the crossclamp. Attention was turned first to the lateral wall of the heart where the first obtuse marginal vessel was opened and was a good size vessel, admitted a 1.5-mm probe.  Using a diamond-type, side-to-side anastomosis was carried out with a second reverse saphenous vein graft.  Distal extent of the same vein was then carried to the very distal circumflex, which was opened and also was a 1.5- mm vessel using a running 7-0 Prolene, a distal anastomosis was performed.  The midportion of the left anterior descending coronary artery was then opened.  The LAD was relatively small especially compared to the OM and circumflex vessels, but did admit a 1-mm probe  distally.  Using a running 8-0 Prolene, left internal mammary artery was anastomosed to the left anterior descending coronary artery.  With the crossclamp still in place, a single punch aortotomy was performed and the sequential vein graft to the OM and distal circ was anastomosed to the ascending aorta.  Heart was allowed to passively fill and de-air, and the bulldog on the mammary artery was removed with the rise in myocardial septal temperature.  Aortic crossclamp was then removed with a total crossclamp time of 70 minutes. Sites of anastomosis were inspected free of bleeding.  The patient initially was in heart block and required DDD pacing.  The body temperature was rewarmed to 37 degrees.  He was then ventilated and weaned from cardiopulmonary bypass without difficulty.  He remained hemodynamically stable.  He was decannulated in usual fashion. Protamine sulfate was administered with operative field hemostatic. Atrial and ventricular pacing wires had been applied.  Graft marker was applied.  A left pleural tube and a Blake mediastinal drain were left in place.  The sternum was closed with #6 stainless steel wire, fascia was closed with interrupted 0 Vicryl, running 3-0 Vicryl in subcutaneous tissue and 4-0 subcuticular stitch in skin edges.  Dry dressings were applied.  Sponge and needle counts were reported as correct at completion of the procedure.  The patient tolerated the procedure without obvious complication and was transferred to the Surgical Intensive care Unit for further postoperative care.  Total pump time was 96 minutes.  The patient did not require any blood products during the operative procedure.     Lanelle Bal, MD     EG/MEDQ  D:  08/09/2015  T:  08/10/2015  Job:  [id number]

## 2015-08-10 NOTE — Progress Notes (Signed)
Pt transferred from [place]. Report given by Charlette Caffey RN.  Patient is alert and oriented with no complaints of pain.  Pt has been placed on the heart monitor and to 2 L oxygen.  Will continue to monitor patient. Lupita Dawn

## 2015-08-10 NOTE — Progress Notes (Addendum)
      [address]       [place] [address]             [phone]      3 Days Post-Op Procedure(s) (LRB): CORONARY ARTERY BYPASS GRAFTING (CABG) x 3 with Endoscopic Vein Harvesting of greater saphenous vein from bilateral thighs (N/A) TRANSESOPHAGEAL ECHOCARDIOGRAM (TEE) (N/A)   Subjective:  [name] has no complaints this morning.  He is ambulating independently around the room.  Objective: Vital signs in last 24 hours: Temp:  [97.7 F (36.5 C)-98.4 F (36.9 C)] 97.7 F (36.5 C) (09/16 0405) Pulse Rate:  [40-95] 95 (09/16 0405) Cardiac Rhythm:  [-] Normal sinus rhythm;Heart block (09/15 2343) Resp:  [12-24] 20 (09/16 0405) BP: (73-129)/(29-65) 128/65 mmHg (09/16 0405) SpO2:  [92 %-100 %] 97 % (09/16 0405) Arterial Line BP: (102-140)/(44-65) 130/55 mmHg (09/15 2200) Weight:  [165 lb 14.4 oz (75.252 kg)] 165 lb 14.4 oz (75.252 kg) (09/16 0405)  Intake/Output from previous day: 09/15 0701 - 09/16 0700 In: 450.1 [I.V.:400.1; IV Piggyback:50] Out: 435 [Urine:375; Chest Tube:60]  General appearance: alert, cooperative and no distress Heart: regular rate and rhythm Lungs: clear to auscultation bilaterally Abdomen: soft, non-tender; bowel sounds normal; no masses,  no organomegaly Extremities: edema trace Wound: clean and dry  Lab Results:  Recent Labs  08/09/15 0315 08/10/15 0603  WBC 17.9* 10.2  HGB 11.0* 11.2*  HCT 32.6* 33.7*  PLT 144* 136*   BMET:  Recent Labs  08/09/15 0315 08/10/15 0603  NA 133* 135  K 4.0 3.9  CL 104 103  CO2 24 27  GLUCOSE 126* 115*  BUN 18 26*  CREATININE 1.09 0.96  CALCIUM 8.3* 8.5*    PT/INR:  Recent Labs  08/07/15 1705  LABPROT 18.5*  INR 1.54*   ABG    Component Value Date/Time   PHART 7.335* 08/07/2015 2330   HCO3 18.5* 08/07/2015 2330   TCO2 19 08/08/2015 1707   ACIDBASEDEF 6.0* 08/07/2015 2330   O2SAT 95.0 08/07/2015 2330   CBG (last 3)   Recent Labs  08/09/15 1554 08/09/15 1917  08/10/15 0622  GLUCAP 131* 121* 103*    Assessment/Plan: S/P Procedure(s) (LRB): CORONARY ARTERY BYPASS GRAFTING (CABG) x 3 with Endoscopic Vein Harvesting of greater saphenous vein from bilateral thighs (N/A) TRANSESOPHAGEAL ECHOCARDIOGRAM (TEE) (N/A)  1. CV- NSR rate in the 90s, pressure is labile- continue Lopressor at 12.5 mg, will titrate as tolerated 2. Pulm- no acute issues, off oxygen, continue IS 3. Renal- creatinine WNL, weight is up 12 lbs, will give IV lasix today 4. Expected post operative blood loss anemia- mild 5. Cbgs-controlled, patient is not a diabetic, will d/c SSIP 6. Dispo- patient stable, maintaining NSR will d/c EPW, titrate lopressor as BP allows, likely home this weekend   LOS: 8 days    BARRETT, ERIN 08/10/2015  Doing well Poss snf in am I have seen and examined [name] and agree with the above assessment  and plan.  [name] Isaac MD Beeper [phone] Office [phone] 08/10/2015 5:54 PM

## 2015-08-10 NOTE — Progress Notes (Signed)
Epicardial pacing wires X2 removed per protocol without problems. Tips intact. Pt tolerated well. VS and rhythm stable. Pt to be on bedrest for 1 hour with VS monitoring. Pt educated.

## 2015-08-10 NOTE — Progress Notes (Signed)
CARDIAC REHAB PHASE I   PRE:  Rate/Rhythm: 93 SR with PVC    BP: sitting 105/54    SaO2: 90 RA, 94 with PLB  MODE:  Ambulation: 390 ft   POST:  Rate/Rhythm: 120 ST with PVCs    BP: sitting 125/65     SaO2: 97 RA  Pt SOB upon waking up. SaO2 90 Ra but practiced pursed lip breathing and able to increase to 94 RA. Pt tried walking without RW first lap (150 ft), slightly wobbly, no LOB with min assist. Requested walking farther with RW. [place] with RW but pt tended to put weight on arms and hunch shoulders. Attempted to instruct him not to do this but he didn't change. HR elevated toward end of walk. Inspiring 1200 mL on IS (was 2500 preop). Will f/u. [phone]  Josephina Shih [place] CES, ACSM 08/10/2015 10:46 AM

## 2015-08-10 NOTE — Care Management Important Message (Signed)
Important Message  Patient Details  Name: Isaac [name] MRN: [id number] Date of Birth: [birth year]   Medicare Important Message Given:  Yes-fourth notification given    Nathen May 08/10/2015, 11:23 AM

## 2015-08-11 DIAGNOSIS — F0390 Unspecified dementia without behavioral disturbance: Secondary | ICD-10-CM | POA: Diagnosis not present

## 2015-08-11 DIAGNOSIS — E785 Hyperlipidemia, unspecified: Secondary | ICD-10-CM | POA: Diagnosis not present

## 2015-08-11 DIAGNOSIS — I2583 Coronary atherosclerosis due to lipid rich plaque: Secondary | ICD-10-CM | POA: Diagnosis present

## 2015-08-11 DIAGNOSIS — Z48812 Encounter for surgical aftercare following surgery on the circulatory system: Secondary | ICD-10-CM | POA: Diagnosis not present

## 2015-08-11 DIAGNOSIS — R509 Fever, unspecified: Secondary | ICD-10-CM | POA: Diagnosis not present

## 2015-08-11 DIAGNOSIS — J15211 Pneumonia due to Methicillin susceptible Staphylococcus aureus: Secondary | ICD-10-CM | POA: Diagnosis not present

## 2015-08-11 DIAGNOSIS — M6281 Muscle weakness (generalized): Secondary | ICD-10-CM | POA: Diagnosis not present

## 2015-08-11 DIAGNOSIS — Y95 Nosocomial condition: Secondary | ICD-10-CM | POA: Diagnosis present

## 2015-08-11 DIAGNOSIS — I10 Essential (primary) hypertension: Secondary | ICD-10-CM | POA: Diagnosis not present

## 2015-08-11 DIAGNOSIS — R29898 Other symptoms and signs involving the musculoskeletal system: Secondary | ICD-10-CM | POA: Diagnosis not present

## 2015-08-11 DIAGNOSIS — R2681 Unsteadiness on feet: Secondary | ICD-10-CM | POA: Diagnosis not present

## 2015-08-11 DIAGNOSIS — E039 Hypothyroidism, unspecified: Secondary | ICD-10-CM | POA: Diagnosis not present

## 2015-08-11 DIAGNOSIS — R41841 Cognitive communication deficit: Secondary | ICD-10-CM | POA: Diagnosis not present

## 2015-08-11 DIAGNOSIS — Z951 Presence of aortocoronary bypass graft: Secondary | ICD-10-CM | POA: Diagnosis not present

## 2015-08-11 DIAGNOSIS — R070 Pain in throat: Secondary | ICD-10-CM | POA: Diagnosis not present

## 2015-08-11 DIAGNOSIS — I2510 Atherosclerotic heart disease of native coronary artery without angina pectoris: Secondary | ICD-10-CM | POA: Diagnosis not present

## 2015-08-11 DIAGNOSIS — R1312 Dysphagia, oropharyngeal phase: Secondary | ICD-10-CM | POA: Diagnosis not present

## 2015-08-11 DIAGNOSIS — M5430 Sciatica, unspecified side: Secondary | ICD-10-CM | POA: Diagnosis not present

## 2015-08-11 DIAGNOSIS — J189 Pneumonia, unspecified organism: Secondary | ICD-10-CM | POA: Diagnosis not present

## 2015-08-11 DIAGNOSIS — R05 Cough: Secondary | ICD-10-CM | POA: Diagnosis not present

## 2015-08-11 DIAGNOSIS — I25709 Atherosclerosis of coronary artery bypass graft(s), unspecified, with unspecified angina pectoris: Secondary | ICD-10-CM | POA: Diagnosis not present

## 2015-08-11 MED ORDER — POTASSIUM CHLORIDE CRYS ER 20 MEQ PO TBCR: 20.0000 meq | EXTENDED_RELEASE_TABLET | Freq: Every day | ORAL | Status: DC

## 2015-08-11 MED ORDER — METOPROLOL TARTRATE 25 MG PO TABS: 12.5000 mg | ORAL_TABLET | Freq: Two times a day (BID) | ORAL | Status: DC

## 2015-08-11 MED ORDER — TRAMADOL HCL 50 MG PO TABS: 50.0000 mg | ORAL_TABLET | ORAL | Status: DC | PRN

## 2015-08-11 MED ORDER — FUROSEMIDE 40 MG PO TABS: 40.0000 mg | ORAL_TABLET | Freq: Every day | ORAL | Status: DC

## 2015-08-11 NOTE — Progress Notes (Addendum)
       [address]       [place] [address]             [phone]          4 Days Post-Op Procedure(s) (LRB): CORONARY ARTERY BYPASS GRAFTING (CABG) x 3 with Endoscopic Vein Harvesting of greater saphenous vein from bilateral thighs (N/A) TRANSESOPHAGEAL ECHOCARDIOGRAM (TEE) (N/A)  Subjective: No complaints this am.    Objective: Vital signs in last 24 hours: Patient Vitals for the past 24 hrs:  BP Temp Temp src Pulse Resp SpO2 Weight  08/11/15 0607 118/60 mmHg 98.7 F (37.1 C) Oral 94 18 92 % 162 lb 11.2 oz (73.8 kg)  08/10/15 2144 105/69 mmHg 99.1 F (37.3 C) Oral 98 18 92 % -  08/10/15 1331 103/64 mmHg 97.8 F (36.6 C) Oral 85 17 96 % -  08/10/15 1200 (!) 101/47 mmHg - - 84 - - -  08/10/15 1140 (!) 106/47 mmHg - - 84 - - -  08/10/15 1125 (!) 89/50 mmHg - - 85 - - -  08/10/15 1110 (!) 96/53 mmHg - - 88 - - -  08/10/15 1104 (!) 113/54 mmHg - - 80 - - -  08/10/15 1051 125/65 mmHg - - (!) 108 - - -   Current Weight  08/11/15 162 lb 11.2 oz (73.8 kg)   BASELINE WEIGHT: 69 kg          Intake/Output from previous day: 09/16 0701 - 09/17 0700 In: 603 [P.O.:600; I.V.:3] Out: 1725 [Urine:1725]  CBGs 107-104   PHYSICAL EXAM:  Heart: RRR Lungs: Clear Wound: Clean and dry Extremities: Mild LE edema    Lab Results: CBC: Recent Labs  08/09/15 0315 08/10/15 0603  WBC 17.9* 10.2  HGB 11.0* 11.2*  HCT 32.6* 33.7*  PLT 144* 136*   BMET:  Recent Labs  08/09/15 0315 08/10/15 0603  NA 133* 135  K 4.0 3.9  CL 104 103  CO2 24 27  GLUCOSE 126* 115*  BUN 18 26*  CREATININE 1.09 0.96  CALCIUM 8.3* 8.5*    PT/INR: No results for input(s): LABPROT, INR in the last 72 hours.    Assessment/Plan: S/P Procedure(s) (LRB): CORONARY ARTERY BYPASS GRAFTING (CABG) x 3 with Endoscopic Vein Harvesting of greater saphenous vein from bilateral thighs (N/A) TRANSESOPHAGEAL ECHOCARDIOGRAM (TEE) (N/A)  CV- gets tachy in low 100s with  exertion, but SBPs borderline.  Continue low dose beta blocker and watch.  Vol overload- continue diuresis.  Disp- stable to transfer to [facility] today. Instructions reviewed with patient and wife.   LOS: 9 days    [name],Isaac H 08/11/2015  [facility] I have seen and examined Isaac [name] and agree with the above assessment  and plan.  [name] MD Beeper [phone] Office [phone] 08/11/2015 12:51 PM

## 2015-08-11 NOTE — Progress Notes (Signed)
Pt ambulated with NT using RW, 2 laps around circle.  Plan to DC this afternoon.

## 2015-08-11 NOTE — Progress Notes (Signed)
Patient for d/c today to SNF bed at [facility]. Wife and patient agreeable to this plan- plan transfer via wife's car.Eduard Clos, MSW, [place]

## 2015-08-12 ENCOUNTER — Emergency Department (HOSPITAL_COMMUNITY): Payer: Medicare Other

## 2015-08-12 ENCOUNTER — Encounter (HOSPITAL_COMMUNITY): Payer: Self-pay | Admitting: Emergency Medicine

## 2015-08-12 ENCOUNTER — Inpatient Hospital Stay (HOSPITAL_COMMUNITY)
Admission: EM | Admit: 2015-08-12 | Discharge: 2015-08-14 | DRG: 195 | Disposition: A | Discharge status: Skilled Nursing Facility | Payer: Medicare Other | Attending: Internal Medicine | Admitting: Internal Medicine

## 2015-08-12 DIAGNOSIS — I10 Essential (primary) hypertension: Secondary | ICD-10-CM | POA: Diagnosis not present

## 2015-08-12 DIAGNOSIS — E785 Hyperlipidemia, unspecified: Secondary | ICD-10-CM | POA: Diagnosis present

## 2015-08-12 DIAGNOSIS — I2510 Atherosclerotic heart disease of native coronary artery without angina pectoris: Secondary | ICD-10-CM | POA: Diagnosis present

## 2015-08-12 DIAGNOSIS — I2583 Coronary atherosclerosis due to lipid rich plaque: Secondary | ICD-10-CM | POA: Diagnosis present

## 2015-08-12 DIAGNOSIS — R05 Cough: Secondary | ICD-10-CM | POA: Diagnosis not present

## 2015-08-12 DIAGNOSIS — M5430 Sciatica, unspecified side: Secondary | ICD-10-CM | POA: Diagnosis present

## 2015-08-12 DIAGNOSIS — J189 Pneumonia, unspecified organism: Principal | ICD-10-CM | POA: Diagnosis present

## 2015-08-12 DIAGNOSIS — R509 Fever, unspecified: Secondary | ICD-10-CM | POA: Diagnosis present

## 2015-08-12 DIAGNOSIS — I970 Postcardiotomy syndrome: Secondary | ICD-10-CM | POA: Diagnosis present

## 2015-08-12 DIAGNOSIS — Z951 Presence of aortocoronary bypass graft: Secondary | ICD-10-CM

## 2015-08-12 DIAGNOSIS — Y95 Nosocomial condition: Secondary | ICD-10-CM | POA: Diagnosis present

## 2015-08-12 DIAGNOSIS — I219 Acute myocardial infarction, unspecified: Secondary | ICD-10-CM | POA: Insufficient documentation

## 2015-08-12 LAB — CBC WITH DIFFERENTIAL/PLATELET
Basophils Absolute: 0 10*3/uL (ref 0.0–0.1)
Basophils Relative: 0 %
EOS PCT: 1 %
Eosinophils Absolute: 0.1 10*3/uL (ref 0.0–0.7)
HEMATOCRIT: 30.8 % — AB (ref 39.0–52.0)
Hemoglobin: 10.4 g/dL — ABNORMAL LOW (ref 13.0–17.0)
LYMPHS ABS: 1.6 10*3/uL (ref 0.7–4.0)
LYMPHS PCT: 16 %
MCH: 30.7 pg (ref 26.0–34.0)
MCHC: 33.8 g/dL (ref 30.0–36.0)
MCV: 90.9 fL (ref 78.0–100.0)
MONO ABS: 1.5 10*3/uL — AB (ref 0.1–1.0)
Monocytes Relative: 15 %
Neutro Abs: 6.8 10*3/uL (ref 1.7–7.7)
Neutrophils Relative %: 68 %
PLATELETS: 195 10*3/uL (ref 150–400)
RBC: 3.39 MIL/uL — AB (ref 4.22–5.81)
RDW: 13.9 % (ref 11.5–15.5)
WBC: 9.9 10*3/uL (ref 4.0–10.5)

## 2015-08-12 LAB — URINALYSIS, ROUTINE W REFLEX MICROSCOPIC
Bilirubin Urine: NEGATIVE
Glucose, UA: NEGATIVE mg/dL
Hgb urine dipstick: NEGATIVE
Ketones, ur: 15 mg/dL — AB
Leukocytes, UA: NEGATIVE
NITRITE: NEGATIVE
Protein, ur: 30 mg/dL — AB
SPECIFIC GRAVITY, URINE: 1.029 (ref 1.005–1.030)
UROBILINOGEN UA: 1 mg/dL (ref 0.0–1.0)
pH: 5 (ref 5.0–8.0)

## 2015-08-12 LAB — URINE MICROSCOPIC-ADD ON

## 2015-08-12 LAB — COMPREHENSIVE METABOLIC PANEL
ALBUMIN: 2.4 g/dL — AB (ref 3.5–5.0)
ALK PHOS: 104 U/L (ref 38–126)
ALT: 55 U/L (ref 17–63)
ANION GAP: 7 (ref 5–15)
AST: 66 U/L — AB (ref 15–41)
BUN: 24 mg/dL — AB (ref 6–20)
CALCIUM: 8.4 mg/dL — AB (ref 8.9–10.3)
CO2: 24 mmol/L (ref 22–32)
Chloride: 105 mmol/L (ref 101–111)
Creatinine, Ser: 0.96 mg/dL (ref 0.61–1.24)
GFR calc Af Amer: >60 mL/min (ref >60)
GFR calc non Af Amer: >60 mL/min (ref >60)
GLUCOSE: 113 mg/dL — AB (ref 65–99)
POTASSIUM: 4 mmol/L (ref 3.5–5.1)
Sodium: 136 mmol/L (ref 135–145)
Total Bilirubin: 0.7 mg/dL (ref 0.3–1.2)
Total Protein: 4.8 g/dL — ABNORMAL LOW (ref 6.5–8.1)

## 2015-08-12 LAB — PROTIME-INR
INR: 1.17 (ref 0.00–1.49)
Prothrombin Time: 15.1 seconds (ref 11.6–15.2)

## 2015-08-12 LAB — I-STAT CG4 LACTIC ACID, ED: Lactic Acid, Venous: 0.71 mmol/L (ref 0.5–2.0)

## 2015-08-12 LAB — APTT: APTT: 32 s (ref 24–37)

## 2015-08-12 LAB — LACTIC ACID, PLASMA
LACTIC ACID, VENOUS: 1 mmol/L (ref 0.5–2.0)
Lactic Acid, Venous: 1.9 mmol/L (ref 0.5–2.0)

## 2015-08-12 LAB — PROCALCITONIN: PROCALCITONIN: 0.29 ng/mL

## 2015-08-12 MED ORDER — DONEPEZIL HCL 5 MG PO TABS
5.0000 mg | ORAL_TABLET | Freq: Every day | ORAL | Status: DC
  Administered 2015-08-12 – 2015-08-13 (×2): 5 mg via ORAL
  Filled 2015-08-12 (×2): qty 1

## 2015-08-12 MED ORDER — ADULT MULTIVITAMIN W/MINERALS CH
1.0000 | ORAL_TABLET | Freq: Two times a day (BID) | ORAL | Status: DC
  Administered 2015-08-12 – 2015-08-14 (×5): 1 via ORAL
  Filled 2015-08-12 (×5): qty 1

## 2015-08-12 MED ORDER — PROBIOTIC PO CAPS: 1.0000 | ORAL_CAPSULE | Freq: Every day | ORAL | Status: DC

## 2015-08-12 MED ORDER — KRILL OIL PLUS PO CAPS: 1.0000 | ORAL_CAPSULE | Freq: Every day | ORAL | Status: DC

## 2015-08-12 MED ORDER — VANCOMYCIN HCL IN DEXTROSE 750-5 MG/150ML-% IV SOLN
750.0000 mg | INTRAVENOUS | Status: DC
  Administered 2015-08-13: 750 mg via INTRAVENOUS
  Filled 2015-08-12: qty 150

## 2015-08-12 MED ORDER — SODIUM CHLORIDE 0.9 % IV SOLN: INTRAVENOUS | Status: DC

## 2015-08-12 MED ORDER — METOPROLOL TARTRATE 12.5 MG HALF TABLET
12.5000 mg | ORAL_TABLET | Freq: Two times a day (BID) | ORAL | Status: DC
  Administered 2015-08-12 – 2015-08-14 (×5): 12.5 mg via ORAL
  Filled 2015-08-12 (×5): qty 1

## 2015-08-12 MED ORDER — ALBUTEROL SULFATE (2.5 MG/3ML) 0.083% IN NEBU: 2.5000 mg | INHALATION_SOLUTION | RESPIRATORY_TRACT | Status: DC | PRN

## 2015-08-12 MED ORDER — RISAQUAD PO CAPS
1.0000 | ORAL_CAPSULE | Freq: Every day | ORAL | Status: DC
  Administered 2015-08-12 – 2015-08-14 (×3): 1 via ORAL
  Filled 2015-08-12 (×3): qty 1

## 2015-08-12 MED ORDER — DM-GUAIFENESIN ER 30-600 MG PO TB12
1.0000 | ORAL_TABLET | Freq: Two times a day (BID) | ORAL | Status: DC
  Administered 2015-08-12: 1 via ORAL
  Filled 2015-08-12: qty 1

## 2015-08-12 MED ORDER — PRAVASTATIN SODIUM 40 MG PO TABS
40.0000 mg | ORAL_TABLET | Freq: Every evening | ORAL | Status: DC
  Administered 2015-08-12 – 2015-08-13 (×2): 40 mg via ORAL
  Filled 2015-08-12 (×2): qty 1

## 2015-08-12 MED ORDER — ADULT MULTIVITAMIN W/MINERALS CH: 1.0000 | ORAL_TABLET | Freq: Two times a day (BID) | ORAL | Status: DC

## 2015-08-12 MED ORDER — IPRATROPIUM-ALBUTEROL 0.5-2.5 (3) MG/3ML IN SOLN
3.0000 mL | RESPIRATORY_TRACT | Status: DC
  Administered 2015-08-12 (×2): 3 mL via RESPIRATORY_TRACT
  Filled 2015-08-12: qty 3

## 2015-08-12 MED ORDER — LEVOFLOXACIN IN D5W 750 MG/150ML IV SOLN: 750.0000 mg | Freq: Once | INTRAVENOUS | Status: DC

## 2015-08-12 MED ORDER — GLUCOSAMINE-CHONDROITIN 500-400 MG PO TABS: 1.0000 | ORAL_TABLET | Freq: Every day | ORAL | Status: DC

## 2015-08-12 MED ORDER — HEPARIN SODIUM (PORCINE) 5000 UNIT/ML IJ SOLN
5000.0000 [IU] | Freq: Three times a day (TID) | INTRAMUSCULAR | Status: DC
  Administered 2015-08-12 – 2015-08-14 (×7): 5000 [IU] via SUBCUTANEOUS
  Filled 2015-08-12 (×7): qty 1

## 2015-08-12 MED ORDER — ASPIRIN EC 81 MG PO TBEC
81.0000 mg | DELAYED_RELEASE_TABLET | Freq: Every day | ORAL | Status: DC
  Administered 2015-08-12 – 2015-08-14 (×3): 81 mg via ORAL
  Filled 2015-08-12 (×3): qty 1

## 2015-08-12 MED ORDER — PIPERACILLIN-TAZOBACTAM 3.375 G IVPB
3.3750 g | Freq: Three times a day (TID) | INTRAVENOUS | Status: DC
  Administered 2015-08-12 – 2015-08-14 (×7): 3.375 g via INTRAVENOUS
  Filled 2015-08-12 (×9): qty 50

## 2015-08-12 MED ORDER — TRAMADOL HCL 50 MG PO TABS: 50.0000 mg | ORAL_TABLET | ORAL | Status: DC | PRN

## 2015-08-12 MED ORDER — ONDANSETRON HCL 4 MG/2ML IJ SOLN: 4.0000 mg | Freq: Three times a day (TID) | INTRAMUSCULAR | Status: DC | PRN

## 2015-08-12 MED ORDER — TURMERIC 500 MG PO CAPS
500.0000 mg | ORAL_CAPSULE | Freq: Every day | ORAL | Status: DC
Start: 2015-08-12 — End: 2015-08-12

## 2015-08-12 MED ORDER — VANCOMYCIN HCL IN DEXTROSE 1-5 GM/200ML-% IV SOLN
1000.0000 mg | Freq: Once | INTRAVENOUS | Status: AC
  Administered 2015-08-12: 1000 mg via INTRAVENOUS
  Filled 2015-08-12: qty 200

## 2015-08-12 MED ORDER — GUAIFENESIN ER 600 MG PO TB12
1200.0000 mg | ORAL_TABLET | Freq: Two times a day (BID) | ORAL | Status: DC
  Administered 2015-08-12 – 2015-08-14 (×5): 1200 mg via ORAL
  Filled 2015-08-12 (×5): qty 2

## 2015-08-12 MED ORDER — IPRATROPIUM-ALBUTEROL 0.5-2.5 (3) MG/3ML IN SOLN
3.0000 mL | Freq: Two times a day (BID) | RESPIRATORY_TRACT | Status: DC
  Administered 2015-08-13 – 2015-08-14 (×3): 3 mL via RESPIRATORY_TRACT
  Filled 2015-08-12 (×3): qty 3

## 2015-08-12 MED ORDER — WHITE PETROLATUM GEL
Status: AC
  Administered 2015-08-12: 22:00:00
  Filled 2015-08-12: qty 1

## 2015-08-12 NOTE — Progress Notes (Signed)
ANTIBIOTIC CONSULT NOTE - INITIAL  Pharmacy Consult for Vancomycin and Zosyn Indication: rule out pneumonia  No Known Allergies  Patient Measurements: Height: 5' 9.5" (176.5 cm) Weight: 152 lb 5 oz (69.088 kg) IBW/kg (Calculated) : 71.85  Vital Signs: Temp: 97.9 F (36.6 C) (09/18 0154) Temp Source: Oral (09/18 0154) BP: 101/53 mmHg (09/18 0630) Pulse Rate: 82 (09/18 0630) Intake/Output from previous day:   Intake/Output from this shift:    Labs:  Recent Labs  08/10/15 0603 08/12/15 0210  WBC 10.2 9.9  HGB 11.2* 10.4*  PLT 136* 195  CREATININE 0.96 0.96   Estimated Creatinine Clearance: 53 mL/min (by C-G formula based on Cr of 0.96). No results for input(s): VANCOTROUGH, VANCOPEAK, VANCORANDOM, GENTTROUGH, GENTPEAK, GENTRANDOM, TOBRATROUGH, TOBRAPEAK, TOBRARND, AMIKACINPEAK, AMIKACINTROU, AMIKACIN in the last 72 hours.   Microbiology: Recent Results (from the past 720 hour(s))  MRSA PCR Screening     Status: None   Collection Time: 08/06/15 10:45 AM  Result Value Ref Range Status   MRSA by PCR NEGATIVE NEGATIVE Final    Comment:        The GeneXpert MRSA Assay (FDA approved for NASAL specimens only), is one component of a comprehensive MRSA colonization surveillance program. It is not intended to diagnose MRSA infection nor to guide or monitor treatment for MRSA infections.   Surgical pcr screen     Status: None   Collection Time: 08/06/15  9:18 PM  Result Value Ref Range Status   MRSA, PCR NEGATIVE NEGATIVE Final   Staphylococcus aureus NEGATIVE NEGATIVE Final    Comment:        The Xpert SA Assay (FDA approved for NASAL specimens in patients over 17 years of age), is one component of a comprehensive surveillance program.  Test performance has been validated by [facility] for patients greater than or equal to 23 year old. It is not intended to diagnose infection nor to guide or monitor treatment.     Medical History: Past Medical History   Diagnosis Date  . MI (myocardial infarction)   . Sciatica   . Hypertension   . Coronary artery disease     Medications:  Lasix  Lopresor  KCl  Ultram  ASA  Aricept  Fish Oil  MVI  Pravachol    Assessment: 81 y.o. male with cough/fever, possible HCAP, for empiric antibiotics. Vancomycin 1 g IV given in ED at  0630  Goal of Therapy:  Vancomycin trough level 15-20 mcg/ml  Plan:  Vancomycin 750 mg IV q24h Zosyn 3.375 g IV q8h   Caryl Pina 08/12/2015,6:52 AM

## 2015-08-12 NOTE — Progress Notes (Signed)
Patient ID: Isaac [name], male   DOB: December 31, 1925, 81 y.o.   MRN: [id number]      [address]       [place] [address]             [phone]                      LOS: 0 days   Subjective: Patient returned from nursing home last pm with night sweats and fever. He was admitted, has had no fever, no elevated wbc, little or no cough.  Objective: Vital signs in last 24 hours: Patient Vitals for the past 24 hrs:  BP Temp Temp src Pulse Resp SpO2 Height Weight  08/12/15 1540 - - - - - 97 % - -  08/12/15 1354 (!) 143/64 mmHg 98.3 F (36.8 C) Oral 98 17 95 % - -  08/12/15 0731 124/64 mmHg 98.1 F (36.7 C) Oral 89 18 93 % 5\' 9"  (1.753 m) 164 lb 12.8 oz (74.753 kg)  08/12/15 0630 (!) 101/53 mmHg - - 82 18 92 % - -  08/12/15 0530 127/56 mmHg - - 84 22 94 % - -  08/12/15 0500 102/56 mmHg - - 70 19 94 % - -  08/12/15 0430 (!) 117/48 mmHg - - 65 17 95 % - -  08/12/15 0415 (!) 111/53 mmHg - - 93 21 95 % - -  08/12/15 0345 125/66 mmHg - - 91 24 92 % - -  08/12/15 0200 (!) 97/51 mmHg - - 77 16 94 % - -  08/12/15 0155 - - - - - 94 % - -  08/12/15 0154 98/55 mmHg 97.9 F (36.6 C) Oral 84 21 94 % 5' 9.5" (1.765 m) 152 lb 5 oz (69.088 kg)  08/12/15 0151 - - - - - 94 % - -    Filed Weights   08/12/15 0154 08/12/15 0731  Weight: 152 lb 5 oz (69.088 kg) 164 lb 12.8 oz (74.753 kg)    Hemodynamic parameters for last 24 hours:    Intake/Output from previous day:   Intake/Output this shift:    Scheduled Meds: . acidophilus  1 capsule Oral Daily  . aspirin EC  81 mg Oral Daily  . donepezil  5 mg Oral QHS  . guaiFENesin  1,200 mg Oral BID  . heparin  5,000 Units Subcutaneous 3 times per day  . ipratropium-albuterol  3 mL Nebulization Q4H  . metoprolol tartrate  12.5 mg Oral BID  . multivitamin with minerals  1 tablet Oral BID  . piperacillin-tazobactam (ZOSYN)  IV  3.375 g Intravenous 3 times per day  . pravastatin  40 mg Oral QPM  . [START ON 08/13/2015] vancomycin  750  mg Intravenous Q24H   Continuous Infusions:  PRN Meds:.ondansetron, traMADol  General appearance: alert, cooperative and no distress Neurologic: intact Heart: regular rate and rhythm, S1, S2 normal, no murmur, click, rub or gallop Lungs: diminished breath sounds bibasilar Abdomen: soft, non-tender; bowel sounds normal; no masses,  no organomegaly Extremities: extremities normal, atraumatic, no cyanosis or edema and Homans sign is negative, no sign of DVT Wound: wounds intact without evidence of  infection No pericardial rub  Lab Results: CBC: Recent Labs  08/10/15 0603 08/12/15 0210  WBC 10.2 9.9  HGB 11.2* 10.4*  HCT 33.7* 30.8*  PLT 136* 195   BMET:  Recent Labs  08/10/15 0603 08/12/15 0210  NA 135 136  K 3.9 4.0  CL 103  105  CO2 27 24  GLUCOSE 115* 113*  BUN 26* 24*  CREATININE 0.96 0.96  CALCIUM 8.5* 8.4*    PT/INR:  Recent Labs  08/12/15 1020  LABPROT 15.1  INR 1.17     Radiology Dg Chest 2 View  08/12/2015   CLINICAL DATA:  Cough and fever.  Recent CABG 08/07/2015  EXAM: CHEST  2 VIEW  COMPARISON:  08/10/2015  FINDINGS: Patient is post median sternotomy and CABG. Mild increase in left pleural effusion and basilar airspace disease compared to prior exam. Improved aeration in the right lung with near complete resolution of previous right pleural effusion. There is no pulmonary edema. No pneumothorax. Cardiomediastinal contours are unchanged.  IMPRESSION: 1. Mild increase in left pleural effusion and adjacent basilar airspace disease. 2. Improved right lung aeration with near complete resolution of previous right pleural effusion.   Electronically Signed   By: Jeb Levering M.D.   On: 08/12/2015 03:58     I have independently reviewed the above radiology studies  and reviewed the findings with the patient.    Assessment/Plan: S/P   CABG Patient feels well, unsure of dx of pneumonia without fever, elevated WBC or infiltrate on chest xray Night sweats   and low grade temp can be manifestation of post pericardiotomy syndrome     [name] MD 08/12/2015 6:12 PM

## 2015-08-12 NOTE — ED Notes (Signed)
Called lab to check on urinalysis.

## 2015-08-12 NOTE — Progress Notes (Signed)
Chaplain visited with Pt and family in follow-up. Chaplain offered spiritual and emotional support to pt as well as prayer and scriptures.  Chaplain is available for further support if needed.   [employer], Chaplain

## 2015-08-12 NOTE — Progress Notes (Signed)
Utilization Review Completed.Arbor Leer T9/18/2016  

## 2015-08-12 NOTE — Progress Notes (Signed)
PHARMACIST - PHYSICIAN ORDER COMMUNICATION  CONCERNING: P&T Medication Policy on Herbal Medications  DESCRIPTION:  This patient's order for:  Krill oil, glucosamine, and tumeric  have been noted.  This product(s) is classified as an "herbal" or natural product. Due to a lack of definitive safety studies or FDA approval, nonstandard manufacturing practices, plus the potential risk of unknown drug-drug interactions while on inpatient medications, the Pharmacy and Therapeutics Committee does not permit the use of "herbal" or natural products of this type within [facility].   ACTION TAKEN: The pharmacy department is unable to verify this order at this time and your patient has been informed of this safety policy. Please reevaluate patient's clinical condition at discharge and address if the herbal or natural product(s) should be resumed at that time.

## 2015-08-12 NOTE — H&P (Signed)
[facility] History and Physical  Isaac [name] NOM:[id number] DOB: 05-12-1926 DOA: 08/12/2015  Referring physician: ED physician PCP:  Melinda Crutch, MD  Specialists:   Chief Complaint: Fever, cough  HPI: Isaac [name] is a 81 y.o. male with PMH of hypertension, hyperlipidemia, CAD, myocardial infarction, s/p of CABG on 08/07/15, sciatica, who presents with fever and cough.  Patient was recently hospitalized from 9/7-9/17/16, and had CABG procedure on 08/07/15 by Dr. Dwaine Deter. Patient was discharged at stable condition. He states that he developed fever and chill and cough last night. He had temperature 101.2. He coughs up white mucus. He has mild shortness of breath. He does not have abdominal pain, nausea, vomiting, diarrhea. He has chronic mild dysuria, which is normal to him and has not changed. Patient does not have rashes, unilateral weakness.  In ED, patient was found to have lactic acid 0.71, negative urinalysis, WBC 9.9, temperature normal, no tachycardia, electrolytes and renal function okay. Chest x-ray showed increased left pleural effusion and adjacent basilar airspace disease.  Where does patient live?   At home    Can patient participate in ADLs?  Barely   Review of Systems:   General: has fevers, chills, no changes in body weight, has poor appetite, has fatigue HEENT: no blurry vision, hearing changes or sore throat Pulm: has dyspnea, coughing, no wheezing CV: no chest pain, palpitations Abd: no nausea, vomiting, abdominal pain, diarrhea, constipation GU: has dysuria, no burning on urination, increased urinary frequency, hematuria  Ext: no leg edema Neuro: no unilateral weakness, numbness, or tingling, no vision change or hearing loss Skin: no rash MSK: No muscle spasm, no deformity, no limitation of range of movement in spin Heme: No easy bruising.  Travel history: No recent long distant travel.  Allergy: No Known Allergies  Past Medical History  Diagnosis Date   . MI (myocardial infarction)   . Sciatica   . Hypertension   . Coronary artery disease     Past Surgical History  Procedure Laterality Date  . Coronary stent placement    . Cholecystectomy    . Hernia repair    . Cardiac catheterization N/A 08/03/2015    Procedure: Left Heart Cath and Coronary Angiography;  Surgeon: Peter M Martinique, MD;  Location: [facility];  Service: Cardiovascular;  Laterality: N/A;  . Cardiac catheterization  08/03/2015    Procedure: Intravascular Pressure Wire/FFR Study;  Surgeon: Peter M Martinique, MD;  Location: [facility];  Service: Cardiovascular;;  . Coronary artery bypass graft N/A 08/07/2015    Procedure: CORONARY ARTERY BYPASS GRAFTING (CABG) x 3 with Endoscopic Vein Harvesting of greater saphenous vein from bilateral thighs;  Surgeon: [name], MD;  Location: [facility];  Service: Open Heart Surgery;  Laterality: N/A;  . Tee without cardioversion N/A 08/07/2015    Procedure: TRANSESOPHAGEAL ECHOCARDIOGRAM (TEE);  Surgeon: [name], MD;  Location: [facility];  Service: Open Heart Surgery;  Laterality: N/A;    Social History:  reports that he has never smoked. He has never used smokeless tobacco. He reports that he does not drink alcohol or use illicit drugs.  Family History:  Family History  Problem Relation Age of Onset  . Heart attack Father      Prior to Admission medications   Medication Sig Start Date End Date Taking? Authorizing Provider  aspirin EC 81 MG tablet Take 81 mg by mouth daily.   Yes Historical Provider, MD  donepezil (ARICEPT) 5 MG tablet Take 5 mg by  mouth at bedtime.   Yes Historical Provider, MD  Fish Oil-Krill Oil (KRILL OIL PLUS PO) Take 1 capsule by mouth daily.   Yes Historical Provider, MD  furosemide (LASIX) 40 MG tablet Take 1 tablet (40 mg total) by mouth daily. X 5 days 08/11/15  Yes Coolidge Breeze, PA-C  glucosamine-chondroitin 500-400 MG tablet Take 1 tablet by mouth daily.   Yes Historical Provider, MD   metoprolol tartrate (LOPRESSOR) 25 MG tablet Take 0.5 tablets (12.5 mg total) by mouth 2 (two) times daily. 08/11/15  Yes Coolidge Breeze, PA-C  Multiple Vitamin (MULTIVITAMIN WITH MINERALS) TABS tablet Take 1 tablet by mouth 2 (two) times daily.   Yes Historical Provider, MD  potassium chloride SA (K-DUR,KLOR-CON) 20 MEQ tablet Take 1 tablet (20 mEq total) by mouth daily. X 5 days 08/11/15  Yes Gina L Collins, PA-C  pravastatin (PRAVACHOL) 40 MG tablet Take 40 mg by mouth every evening.   Yes Historical Provider, MD  Probiotic Product (PROBIOTIC PO) Take 1 tablet by mouth daily.   Yes Historical Provider, MD  traMADol (ULTRAM) 50 MG tablet Take 1 tablet (50 mg total) by mouth every 4 (four) hours as needed for moderate pain. 08/11/15  Yes Gina L Collins, PA-C  Turmeric 500 MG CAPS Take 500 mg by mouth daily.   Yes Historical Provider, MD    Physical Exam: Filed Vitals:   08/12/15 0430 08/12/15 0500 08/12/15 0530 08/12/15 0630  BP: 117/48 102/56 127/56 101/53  Pulse: 65 70 84 82  Temp:      TempSrc:      Resp: 17 19 22 18   Height:      Weight:      SpO2: 95% 94% 94% 92%   General: Not in acute distress HEENT:       Eyes: PERRL, EOMI, no scleral icterus.       ENT: No discharge from the ears and nose, no pharynx injection, no tonsillar enlargement.        Neck: No JVD, no bruit, no mass felt. Heme: No neck lymph node enlargement. Cardiac: S1/S2, RRR, No murmurs, No gallops or rubs. Pulm:  No rales, wheezing, rhonchi or rubs. Surgical site for recent CABG is healing well. Abd: Soft, nondistended, nontender, no rebound pain, no organomegaly, BS present. Ext: No pitting leg edema bilaterally. 2+DP/PT pulse bilaterally. Musculoskeletal: No joint deformities, No joint redness or warmth, no limitation of ROM in spin. Skin: No rashes.  Neuro: Alert, oriented X3, cranial nerves II-XII grossly intact, muscle strength 5/5 in all extremities, sensation to light touch intact.  Psych: Patient is  not psychotic, no suicidal or hemocidal ideation.  Labs on Admission:  Basic Metabolic Panel:  Recent Labs Lab 08/07/15 0342  08/07/15 2254 08/08/15 0315 08/08/15 1707 08/08/15 1712 08/09/15 0315 08/10/15 0603 08/12/15 0210  NA 136  < >  --  135 134*  --  133* 135 136  K 3.8  < >  --  4.3 4.2  --  4.0 3.9 4.0  CL 102  < >  --  108 102  --  104 103 105  CO2 27  --   --  23  --   --  24 27 24   GLUCOSE 91  < >  --  124* 159*  --  126* 115* 113*  BUN 16  < >  --  13 18  --  18 26* 24*  CREATININE 1.03  < > 0.98 0.94 1.00 1.17 1.09 0.96 0.96  CALCIUM 9.0  --   --  8.1*  --   --  8.3* 8.5* 8.4*  MG  --   --  2.9* 2.7*  --  2.6*  --   --   --   < > = values in this interval not displayed. Liver Function Tests:  Recent Labs Lab 08/12/15 0210  AST 66*  ALT 55  ALKPHOS 104  BILITOT 0.7  PROT 4.8*  ALBUMIN 2.4*   No results for input(s): LIPASE, AMYLASE in the last 168 hours. No results for input(s): AMMONIA in the last 168 hours. CBC:  Recent Labs Lab 08/08/15 0315 08/08/15 1707 08/08/15 1712 08/09/15 0315 08/10/15 0603 08/12/15 0210  WBC 15.5*  --  18.9* 17.9* 10.2 9.9  NEUTROABS  --   --   --   --   --  6.8  HGB 11.5* 11.2* 11.5* 11.0* 11.2* 10.4*  HCT 33.7* 33.0* 33.6* 32.6* 33.7* 30.8*  MCV 90.1  --  89.4 90.3 91.6 90.9  PLT 143*  --  141* 144* 136* 195   Cardiac Enzymes: No results for input(s): CKTOTAL, CKMB, CKMBINDEX, TROPONINI in the last 168 hours.  BNP (last 3 results) No results for input(s): BNP in the last 8760 hours.  ProBNP (last 3 results) No results for input(s): PROBNP in the last 8760 hours.  CBG:  Recent Labs Lab 08/09/15 1917 08/10/15 0622 08/10/15 1123 08/10/15 1624 08/10/15 2124  GLUCAP 121* 103* 121* 107* 104*    Radiological Exams on Admission: Dg Chest 2 View  08/12/2015   CLINICAL DATA:  Cough and fever.  Recent CABG 08/07/2015  EXAM: CHEST  2 VIEW  COMPARISON:  08/10/2015  FINDINGS: Patient is post median sternotomy and  CABG. Mild increase in left pleural effusion and basilar airspace disease compared to prior exam. Improved aeration in the right lung with near complete resolution of previous right pleural effusion. There is no pulmonary edema. No pneumothorax. Cardiomediastinal contours are unchanged.  IMPRESSION: 1. Mild increase in left pleural effusion and adjacent basilar airspace disease. 2. Improved right lung aeration with near complete resolution of previous right pleural effusion.   Electronically Signed   By: Jeb Levering M.D.   On: 08/12/2015 03:58   Dg Chest 2 View  08/10/2015   CLINICAL DATA:  Postop CABG.  Chest tube removal.  EXAM: CHEST  2 VIEW  COMPARISON:  08/09/2015 and earlier.  FINDINGS: Sternotomy for CABG. Cardiac silhouette mildly to moderately enlarged. Left chest tube removal with no pneumothorax. Atelectasis in the lower lobes and lingula with improved aeration since yesterday. No new pulmonary parenchymal abnormalities. Pulmonary vascularity normal without evidence pulmonary edema.  IMPRESSION: 1. No pneumothorax after left chest tube removal. 2. Atelectasis in the lower lobes and lingula with improved aeration since yesterday. 3. No new abnormalities.   Electronically Signed   By: Evangeline Dakin M.D.   On: 08/10/2015 08:09    EKG: Not done in ED, will get one.   Assessment/Plan Principal Problem:   HCAP (healthcare-associated pneumonia) Active Problems:   Essential hypertension   Coronary artery disease due to lipid rich plaque   Coronary artery disease   Sciatica   S/P CABG (coronary artery bypass graft)   Fever   HLD (hyperlipidemia)  HCAP (healthcare-associated pneumonia): Patient's cough, fever plus chest x-ray findings are consistent with HCAP. Patient is not septic on admission. Hemodynamically stable.  - Will admit to Telemetry Bed - IV Vancomycin and Zosyn - Mucinex for cough  - prn albuterol  for SOB - Urine legionella and S. pneumococcal antigen - Follow up  blood culture x2, sputum culture plus Flu pcr - will get Procalcitonin and trend lactic acid level - IVF: 75 mL per hour of NS   CAD: s/p of CABG on 08/07/15. Surgical site healing well. No chest pain. 2-D echo on 08/07/15 showed EF of 55-60% -continue ASA, metoprol -consult to wound care for surgical site care.  HTN: -Hold lasix -continue metoprolol  HLD: Last LDL was 78 on 08/02/15. -Continue home medications: Pravastatin and fish oil  Sciatica: -When necessary tramadol  DVT ppx: SQ Heparin    Code Status: Full code Family Communication:  Yes, patient's wife at bed side Disposition Plan: Admit to inpatient   Date of Service 08/12/2015    Ivor Costa [facility] Pager [phone]  If 7PM-7AM, please contact night-coverage [email or web address] Password [facility] 08/12/2015, 6:55 AM

## 2015-08-12 NOTE — ED Notes (Signed)
Lita Mains, MD at bedside.

## 2015-08-12 NOTE — ED Notes (Signed)
Recent patient here with surgical incision to abdomen from triple bypass.  Recently left hospital and went to [facility].  Now states I had a coughing jag and my incision opened up a little.  Per facility running a fever of 101.2.  Given tylenol PTA.  Now noted to be afebrile.  Denies having any pain.

## 2015-08-12 NOTE — ED Notes (Signed)
Pt informed of need for urine sample; pt verbalized understanding

## 2015-08-12 NOTE — Consult Note (Addendum)
WOC wound consult note Reason for Consult:readmitted to hospital following discharge on 9/16 for CABG and vein harvesting. Discharge instructions for care of incisions was for soap and water cleanse daily and to leave open to air.  Wound type:Surgical insicions Pressure Ulcer POA: No Wound bed: Closely approximated surgical incisions on chest and LEs Drainage (amount, consistency, odor) None Periwound: Intact with no unexpected erythema, induration or warmth Dressing procedure/placement/frequency: I have provided Nursing with guidance and they will perform once daily wound cleanse with NS and pat gently dry, leaving open to air. [facility] nursing team will not follow, but will remain available to this patient, the nursing and medical teams.  Please re-consult if needed. Thanks, Maudie Flakes, MSN, RN, [place], [place], [place] [phone])

## 2015-08-12 NOTE — ED Provider Notes (Signed)
CSN: [id number]   Arrival date & time 08/12/15 0140  History  This chart was scribed for Julianne Rice, MD by Altamease Oiler, ED Scribe. This patient was seen in room B17C/B17C and the patient's care was started at 2:02 AM.  Chief Complaint  Patient presents with  . Fever    HPI The history is provided by the patient. No language interpreter was used.   Brought in by EMS from a rehabilitation center, Isaac [name] is a 81 y.o. male 5 days s/p CABG with PMHx of CAD, MI, and HTN who presents to the Emergency Department complaining of fever of up to 101.2 with onset last evening (07/11/15). Pt states that his fever was not responding to Tylenol at the facility. Associated symptoms include chills, sweating, and a brief episode of dry coughing. Pt denies purulent drainage from the incision site, chest pain, abdominal pain, rhinorrhea, vomiting, diarrhea, and LE swelling or pain.   Past Medical History  Diagnosis Date  . MI (myocardial infarction)   . Sciatica   . Hypertension   . Coronary artery disease     Past Surgical History  Procedure Laterality Date  . Coronary stent placement    . Cholecystectomy    . Hernia repair    . Cardiac catheterization N/A 08/03/2015    Procedure: Left Heart Cath and Coronary Angiography;  Surgeon: Peter M Martinique, MD;  Location: [facility];  Service: Cardiovascular;  Laterality: N/A;  . Cardiac catheterization  08/03/2015    Procedure: Intravascular Pressure Wire/FFR Study;  Surgeon: Peter M Martinique, MD;  Location: [facility];  Service: Cardiovascular;;  . Coronary artery bypass graft N/A 08/07/2015    Procedure: CORONARY ARTERY BYPASS GRAFTING (CABG) x 3 with Endoscopic Vein Harvesting of greater saphenous vein from bilateral thighs;  Surgeon: [name], MD;  Location: [facility];  Service: Open Heart Surgery;  Laterality: N/A;  . Tee without cardioversion N/A 08/07/2015    Procedure: TRANSESOPHAGEAL ECHOCARDIOGRAM (TEE);  Surgeon: [name], MD;  Location: [facility];  Service: Open Heart Surgery;  Laterality: N/A;    No family history on file.  Social History  Substance Use Topics  . Smoking status: Never Smoker   . Smokeless tobacco: Never Used  . Alcohol Use: No     Review of Systems  Constitutional: Positive for fever. Negative for chills.  HENT: Negative for congestion, sinus pressure and sore throat.   Respiratory: Positive for cough. Negative for shortness of breath and wheezing.   Cardiovascular: Negative for chest pain and palpitations.  Gastrointestinal: Negative for nausea, vomiting, abdominal pain, diarrhea and constipation.  Genitourinary: Negative for dysuria, frequency and flank pain.  Musculoskeletal: Negative for back pain, neck pain and neck stiffness.  Skin: Positive for wound. Negative for rash.  Neurological: Negative for dizziness, weakness, light-headedness, numbness and headaches.  All other systems reviewed and are negative.  Home Medications   Prior to Admission medications   Medication Sig Start Date End Date Taking? Authorizing Provider  aspirin EC 81 MG tablet Take 81 mg by mouth daily.    Historical Provider, MD  donepezil (ARICEPT) 5 MG tablet Take 5 mg by mouth at bedtime.    Historical Provider, MD  Fish Oil-Krill Oil (KRILL OIL PLUS PO) Take 1 capsule by mouth daily.    Historical Provider, MD  furosemide (LASIX) 40 MG tablet Take 1 tablet (40 mg total) by mouth daily. X 5 days 08/11/15   Coolidge Breeze, PA-C  glucosamine-chondroitin  500-400 MG tablet Take 1 tablet by mouth daily.    Historical Provider, MD  metoprolol tartrate (LOPRESSOR) 25 MG tablet Take 0.5 tablets (12.5 mg total) by mouth 2 (two) times daily. 08/11/15   Coolidge Breeze, PA-C  Multiple Vitamin (MULTIVITAMIN WITH MINERALS) TABS tablet Take 1 tablet by mouth 2 (two) times daily.    Historical Provider, MD  potassium chloride SA (K-DUR,KLOR-CON) 20 MEQ tablet Take 1 tablet (20 mEq total) by mouth daily. X 5 days  08/11/15   Coolidge Breeze, PA-C  pravastatin (PRAVACHOL) 40 MG tablet Take 40 mg by mouth every evening.    Historical Provider, MD  Probiotic Product (PROBIOTIC PO) Take 1 tablet by mouth daily.    Historical Provider, MD  traMADol (ULTRAM) 50 MG tablet Take 1 tablet (50 mg total) by mouth every 4 (four) hours as needed for moderate pain. 08/11/15   Coolidge Breeze, PA-C  Turmeric 500 MG CAPS Take 500 mg by mouth daily.    Historical Provider, MD    Allergies  Review of patient's allergies indicates no known allergies.  Triage Vitals: BP 98/55 mmHg  Pulse 84  Temp(Src) 97.9 F (36.6 C) (Oral)  Resp 21  Ht 5' 9.5" (1.765 m)  Wt 152 lb 5 oz (69.088 kg)  BMI 22.18 kg/m2  SpO2 94%  Physical Exam  Constitutional: He is oriented to person, place, and time. He appears well-developed and well-nourished. No distress.  HENT:  Head: Normocephalic and atraumatic.  Mouth/Throat: Oropharynx is clear and moist. No oropharyngeal exudate.  Eyes: EOM are normal. Pupils are equal, round, and reactive to light.  Neck: Normal range of motion. Neck supple.  No meningismus  Cardiovascular: Normal rate and regular rhythm.  Exam reveals no gallop and no friction rub.   No murmur heard. Pulmonary/Chest: Effort normal and breath sounds normal. No respiratory distress. He has no wheezes. He has no rales. He exhibits no tenderness.  Abdominal: Soft. Bowel sounds are normal. He exhibits no distension and no mass. There is no tenderness. There is no rebound and no guarding.  Musculoskeletal: Normal range of motion. He exhibits no edema or tenderness.  No midline thoracic or lumbar tenderness. No lower extremity swelling or tenderness.  Neurological: He is alert and oriented to person, place, and time.  Moving all extremities without deficit. Sensation is fully intact.  Skin: Skin is warm and dry. No rash noted. No erythema.  Midline surgical scar center chest. It appears to be well healing. No erythema, warmth  or purulent drainage. Small amount of serosanguineous drainage.  Psychiatric: He has a normal mood and affect. His behavior is normal.  Nursing note and vitals reviewed.   ED Course  Procedures   DIAGNOSTIC STUDIES: Oxygen Saturation is 94% on RA, normal by my interpretation.    COORDINATION OF CARE: 2:06 AM Discussed treatment plan which includes CXR and lab work with pt at bedside and pt agreed to plan.  Labs Reviewed  CBC WITH DIFFERENTIAL/PLATELET - Abnormal; Notable for the following:    RBC 3.39 (*)    Hemoglobin 10.4 (*)    HCT 30.8 (*)    Monocytes Absolute 1.5 (*)    All other components within normal limits  COMPREHENSIVE METABOLIC PANEL - Abnormal; Notable for the following:    Glucose, Bld 113 (*)    BUN 24 (*)    Calcium 8.4 (*)    Total Protein 4.8 (*)    Albumin 2.4 (*)    AST 66 (*)  All other components within normal limits  URINALYSIS, ROUTINE W REFLEX MICROSCOPIC (NOT AT [facility]) - Abnormal; Notable for the following:    Ketones, ur 15 (*)    Protein, ur 30 (*)    All other components within normal limits  URINE MICROSCOPIC-ADD ON  I-STAT CG4 LACTIC ACID, ED    Imaging Review Dg Chest 2 View  08/12/2015   CLINICAL DATA:  Cough and fever.  Recent CABG 08/07/2015  EXAM: CHEST  2 VIEW  COMPARISON:  08/10/2015  FINDINGS: Patient is post median sternotomy and CABG. Mild increase in left pleural effusion and basilar airspace disease compared to prior exam. Improved aeration in the right lung with near complete resolution of previous right pleural effusion. There is no pulmonary edema. No pneumothorax. Cardiomediastinal contours are unchanged.  IMPRESSION: 1. Mild increase in left pleural effusion and adjacent basilar airspace disease. 2. Improved right lung aeration with near complete resolution of previous right pleural effusion.   Electronically Signed   By: Jeb Levering M.D.   On: 08/12/2015 03:58   Dg Chest 2 View  08/10/2015   CLINICAL DATA:  Postop  CABG.  Chest tube removal.  EXAM: CHEST  2 VIEW  COMPARISON:  08/09/2015 and earlier.  FINDINGS: Sternotomy for CABG. Cardiac silhouette mildly to moderately enlarged. Left chest tube removal with no pneumothorax. Atelectasis in the lower lobes and lingula with improved aeration since yesterday. No new pulmonary parenchymal abnormalities. Pulmonary vascularity normal without evidence pulmonary edema.  IMPRESSION: 1. No pneumothorax after left chest tube removal. 2. Atelectasis in the lower lobes and lingula with improved aeration since yesterday. 3. No new abnormalities.   Electronically Signed   By: Evangeline Dakin M.D.   On: 08/10/2015 08:09    EKG Interpretation  Date/Time:    Ventricular Rate:    PR Interval:    QRS Duration:   QT Interval:    QTC Calculation:   R Axis:     Text Interpretation:      MDM   Final diagnoses:  HCAP (healthcare-associated pneumonia)     I personally performed the services described in this documentation, which was scribed in my presence. The recorded information has been reviewed and is accurate.  Patient with new infiltrate on x-ray. Bowel sounds remained stable while in the emergency department. Afebrile. Normal white blood cell count. Given history of cough and fever at nursing home, we'll treat for hospital for pneumonia. Discussed with Dr. Blaine Hamper. Will admit to telemetry bed.   Julianne Rice, MD 08/12/15 [phone]

## 2015-08-12 NOTE — Progress Notes (Signed)
[facility] PROGRESS NOTE   Isaac [name] WGN:[id number] DOB: 09/02/1926 DOA: 08/12/2015 PCP:  Melinda Crutch, MD  HPI/Subjective: Seen with wife at bedside, feels better. Less cough and shortness of breath and last night  Assessment/Plan: Principal Problem:   HCAP (healthcare-associated pneumonia) Active Problems:   Essential hypertension   Coronary artery disease due to lipid rich plaque   Coronary artery disease   Sciatica   S/P CABG (coronary artery bypass graft)   Fever   HLD (hyperlipidemia)   Patient seen and examined, data base reviewed. This is a no charge note, patient seen earlier today by my colleague Dr. Blaine Hamper. Presented with cough, fever and x-ray finding consistent with HCAP. Started on vancomycin and Zosyn. Patient has had CABG 3 on Tuesday, 08/07/2015, I will notify Dr. Servando Snare about the patient's admission.  Code Status: Full Code Family Communication: Plan discussed with the patient. Disposition Plan: Remains inpatient Diet: Diet Heart Room service appropriate?: Yes; Fluid consistency:: Thin  Consultants:  None  Procedures:  None  Antibiotics:  Vancomycin and Zosyn   Objective: Filed Vitals:   08/12/15 1354  BP: 143/64  Pulse: 98  Temp: 98.3 F (36.8 C)  Resp: 17   No intake or output data in the 24 hours ending 08/12/15 1403 Filed Weights   08/12/15 0154 08/12/15 0731  Weight: 69.088 kg (152 lb 5 oz) 74.753 kg (164 lb 12.8 oz)    Exam: General: Alert and awake, oriented x3, not in any acute distress. HEENT: anicteric sclera, pupils reactive to light and accommodation, EOMI CVS: S1-S2 clear, no murmur rubs or gallops Chest: clear to auscultation bilaterally, no wheezing, rales or rhonchi Abdomen: soft nontender, nondistended, normal bowel sounds, no organomegaly Extremities: no cyanosis, clubbing or edema noted bilaterally Neuro: Cranial nerves II-XII intact, no focal neurological deficits  Data Reviewed: Basic Metabolic  Panel:  Recent Labs Lab 08/07/15 0342  08/07/15 2254 08/08/15 0315 08/08/15 1707 08/08/15 1712 08/09/15 0315 08/10/15 0603 08/12/15 0210  NA 136  < >  --  135 134*  --  133* 135 136  K 3.8  < >  --  4.3 4.2  --  4.0 3.9 4.0  CL 102  < >  --  108 102  --  104 103 105  CO2 27  --   --  23  --   --  24 27 24   GLUCOSE 91  < >  --  124* 159*  --  126* 115* 113*  BUN 16  < >  --  13 18  --  18 26* 24*  CREATININE 1.03  < > 0.98 0.94 1.00 1.17 1.09 0.96 0.96  CALCIUM 9.0  --   --  8.1*  --   --  8.3* 8.5* 8.4*  MG  --   --  2.9* 2.7*  --  2.6*  --   --   --   < > = values in this interval not displayed. Liver Function Tests:  Recent Labs Lab 08/12/15 0210  AST 66*  ALT 55  ALKPHOS 104  BILITOT 0.7  PROT 4.8*  ALBUMIN 2.4*   No results for input(s): LIPASE, AMYLASE in the last 168 hours. No results for input(s): AMMONIA in the last 168 hours. CBC:  Recent Labs Lab 08/08/15 0315 08/08/15 1707 08/08/15 1712 08/09/15 0315 08/10/15 0603 08/12/15 0210  WBC 15.5*  --  18.9* 17.9* 10.2 9.9  NEUTROABS  --   --   --   --   --  6.8  HGB 11.5* 11.2* 11.5* 11.0* 11.2* 10.4*  HCT 33.7* 33.0* 33.6* 32.6* 33.7* 30.8*  MCV 90.1  --  89.4 90.3 91.6 90.9  PLT 143*  --  141* 144* 136* 195   Cardiac Enzymes: No results for input(s): CKTOTAL, CKMB, CKMBINDEX, TROPONINI in the last 168 hours. BNP (last 3 results) No results for input(s): BNP in the last 8760 hours.  ProBNP (last 3 results) No results for input(s): PROBNP in the last 8760 hours.  CBG:  Recent Labs Lab 08/09/15 1917 08/10/15 0622 08/10/15 1123 08/10/15 1624 08/10/15 2124  GLUCAP 121* 103* 121* 107* 104*    Micro Recent Results (from the past 240 hour(s))  MRSA PCR Screening     Status: None   Collection Time: 08/06/15 10:45 AM  Result Value Ref Range Status   MRSA by PCR NEGATIVE NEGATIVE Final    Comment:        The GeneXpert MRSA Assay (FDA approved for NASAL specimens only), is one component of  a comprehensive MRSA colonization surveillance program. It is not intended to diagnose MRSA infection nor to guide or monitor treatment for MRSA infections.   Surgical pcr screen     Status: None   Collection Time: 08/06/15  9:18 PM  Result Value Ref Range Status   MRSA, PCR NEGATIVE NEGATIVE Final   Staphylococcus aureus NEGATIVE NEGATIVE Final    Comment:        The Xpert SA Assay (FDA approved for NASAL specimens in patients over 77 years of age), is one component of a comprehensive surveillance program.  Test performance has been validated by [facility] for patients greater than or equal to 8 year old. It is not intended to diagnose infection nor to guide or monitor treatment.      Studies: Dg Chest 2 View  08/12/2015   CLINICAL DATA:  Cough and fever.  Recent CABG 08/07/2015  EXAM: CHEST  2 VIEW  COMPARISON:  08/10/2015  FINDINGS: Patient is post median sternotomy and CABG. Mild increase in left pleural effusion and basilar airspace disease compared to prior exam. Improved aeration in the right lung with near complete resolution of previous right pleural effusion. There is no pulmonary edema. No pneumothorax. Cardiomediastinal contours are unchanged.  IMPRESSION: 1. Mild increase in left pleural effusion and adjacent basilar airspace disease. 2. Improved right lung aeration with near complete resolution of previous right pleural effusion.   Electronically Signed   By: Jeb Levering M.D.   On: 08/12/2015 03:58    Scheduled Meds: . acidophilus  1 capsule Oral Daily  . aspirin EC  81 mg Oral Daily  . dextromethorphan-guaiFENesin  1 tablet Oral BID  . donepezil  5 mg Oral QHS  . heparin  5,000 Units Subcutaneous 3 times per day  . metoprolol tartrate  12.5 mg Oral BID  . multivitamin with minerals  1 tablet Oral BID  . piperacillin-tazobactam (ZOSYN)  IV  3.375 g Intravenous 3 times per day  . pravastatin  40 mg Oral QPM  . [START ON 08/13/2015] vancomycin  750 mg  Intravenous Q24H   Continuous Infusions:      Time spent: 35 minutes    [facility] A  [facility] Pager [phone] If 7PM-7AM, please contact night-coverage at [email or web address], password [email or web address] 08/12/2015, 2:03 PM  LOS: 0 days

## 2015-08-13 DIAGNOSIS — I2510 Atherosclerotic heart disease of native coronary artery without angina pectoris: Secondary | ICD-10-CM

## 2015-08-13 DIAGNOSIS — M5430 Sciatica, unspecified side: Secondary | ICD-10-CM

## 2015-08-13 DIAGNOSIS — Z951 Presence of aortocoronary bypass graft: Secondary | ICD-10-CM

## 2015-08-13 DIAGNOSIS — I10 Essential (primary) hypertension: Secondary | ICD-10-CM

## 2015-08-13 DIAGNOSIS — R509 Fever, unspecified: Secondary | ICD-10-CM

## 2015-08-13 LAB — EXPECTORATED SPUTUM ASSESSMENT W GRAM STAIN, RFLX TO RESP C

## 2015-08-13 LAB — EXPECTORATED SPUTUM ASSESSMENT W REFEX TO RESP CULTURE

## 2015-08-13 LAB — INFLUENZA PANEL BY PCR (TYPE A & B)
H1N1FLUPCR: NOT DETECTED
Influenza A By PCR: NEGATIVE
Influenza B By PCR: NEGATIVE

## 2015-08-13 LAB — STREP PNEUMONIAE URINARY ANTIGEN: STREP PNEUMO URINARY ANTIGEN: NEGATIVE

## 2015-08-13 MED ORDER — VANCOMYCIN HCL IN DEXTROSE 750-5 MG/150ML-% IV SOLN
750.0000 mg | Freq: Two times a day (BID) | INTRAVENOUS | Status: DC
  Administered 2015-08-13 – 2015-08-14 (×2): 750 mg via INTRAVENOUS
  Filled 2015-08-13 (×4): qty 150

## 2015-08-13 MED ORDER — FUROSEMIDE 10 MG/ML IJ SOLN
40.0000 mg | Freq: Once | INTRAMUSCULAR | Status: AC
  Administered 2015-08-13: 40 mg via INTRAVENOUS
  Filled 2015-08-13: qty 4

## 2015-08-13 MED ORDER — GUAIFENESIN-DM 100-10 MG/5ML PO SYRP: 5.0000 mL | ORAL_SOLUTION | ORAL | Status: DC | PRN

## 2015-08-13 NOTE — Progress Notes (Addendum)
[facility] DAILY ICU PROGRESS NOTE                   [address]            [place] [address]          [phone]        Total Length of Stay:  LOS: 1 day   Subjective: Comfortable, no complaints. Denies chills, dyspnea, cough, CP,dysuria. Main concern is that staff members have not been following sternal precautions with him.   Objective: Vital signs in last 24 hours: Temp:  [98.3 F (36.8 C)-98.6 F (37 C)] 98.6 F (37 C) (09/19 0405) Pulse Rate:  [86-99] 98 (09/19 0855) Cardiac Rhythm:  [-] Normal sinus rhythm;Heart block (09/19 0704) Resp:  [15-17] 16 (09/19 0855) BP: (111-143)/(52-64) 130/64 mmHg (09/19 0405) SpO2:  [92 %-97 %] 94 % (09/19 0855)  Filed Weights   08/12/15 0154 08/12/15 0731  Weight: 152 lb 5 oz (69.088 kg) 164 lb 12.8 oz (74.753 kg)    Weight change: 12 lb 7.8 oz (5.664 kg)   Hemodynamic parameters for last 24 hours:    Intake/Output from previous day: 09/18 0701 - 09/19 0700 In: -  Out: 350 [Urine:350]  Intake/Output this shift:    Current Meds: Scheduled Meds: . acidophilus  1 capsule Oral Daily  . aspirin EC  81 mg Oral Daily  . donepezil  5 mg Oral QHS  . guaiFENesin  1,200 mg Oral BID  . heparin  5,000 Units Subcutaneous 3 times per day  . ipratropium-albuterol  3 mL Nebulization BID  . metoprolol tartrate  12.5 mg Oral BID  . multivitamin with minerals  1 tablet Oral BID  . piperacillin-tazobactam (ZOSYN)  IV  3.375 g Intravenous 3 times per day  . pravastatin  40 mg Oral QPM  . vancomycin  750 mg Intravenous Q24H   Continuous Infusions:  PRN Meds:.ondansetron, traMADol  Physical Exam: General appearance: alert, cooperative and no distress Heart: regular rate and rhythm, no rub Lungs: clear to auscultation bilaterally Abdomen: soft, non-tender; bowel sounds normal; no masses,  no organomegaly Extremities: No edema Wound: Clean and dry, very mild erythema of L EVH site   Lab Results: CBC: Recent Labs  08/12/15 0210  WBC 9.9  HGB 10.4*  HCT 30.8*  PLT 195   BMET:  Recent Labs  08/12/15 0210  NA 136  K 4.0  CL 105  CO2 24  GLUCOSE 113*  BUN 24*  CREATININE 0.96  CALCIUM 8.4*    PT/INR:  Recent Labs  08/12/15 1020  LABPROT 15.1  INR 1.17   Radiology: No results found.   Assessment/Plan: S/P  CABG, readmitted with fever. No obvious source as urine, CXR and cultures negative. 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 Afebrile with no leukocytosis at present. Continue current care.  Will need continued sternal precautions.  COLLINS,GINA H 08/13/2015 9:13 AM  Sternum stable , no drainage I have seen and examined Isaac [name] and agree with the above assessment  and plan.  [name] MD Beeper [phone] Office [phone] 08/13/2015 4:37 PM

## 2015-08-13 NOTE — Evaluation (Signed)
Occupational Therapy Evaluation Patient Details Name: Isaac [name] MRN: [id number] DOB: May 17, 1926 Today's Date: 08/13/2015    History of Present Illness 81 yo male s/p recent CABG(08/08/15) who has returned from nursing home 9/18 with night sweats and fever. He was admitted, has had no fever, no elevated wbc, little or no cough. PMH: CAD, HTN, sciatica   Clinical Impression   Patient presenting with decreased ADL, functional mobility independence secondary to recent CABGx3. Patient independent prior to this surgery. Patient currently functioning at an overall min guard assist level. Patient will benefit from acute OT to increase overall independence in the areas of ADLs, functional mobility, and overall safety in order to safely discharge back to [facility] SNF prior to going back home to live with his wife in their independent living facility.     Follow Up Recommendations  SNF;Supervision/Assistance - 24 hour    Equipment Recommendations  Other (comment) (TBD next venue of care)    Recommendations for Other Services  None at this time    Precautions / Restrictions Precautions Precautions: Sternal;Fall Restrictions Weight Bearing Restrictions: No    Mobility Bed Mobility Overal bed mobility: Needs Assistance Bed Mobility: Supine to Sit     Supine to sit: Mod assist;HOB elevated     General bed mobility comments: Mod assist for trunk support secondary to sternal precautions. No cues needed for this, pt able to appropriately ask for help/assistance.   Transfers Overall transfer level: Needs assistance Equipment used: Rolling walker (2 wheeled) Transfers: Sit to/from Stand Sit to Stand: Min guard         General transfer comment: Min guard secondary to sternal precautions. No cueing needed for this, pt kept hands in lap when performing sit<>stands in order to adhere to sternal precautions.     Balance Overall balance assessment: Needs  assistance Sitting-balance support: No upper extremity supported;Feet supported Sitting balance-Leahy Scale: Fair     Standing balance support: No upper extremity supported;During functional activity Standing balance-Leahy Scale: Fair    ADL Overall ADL's : Needs assistance/impaired General ADL Comments: Pt able to reach BLEs for LB ADLs by crossing feet over legs. Pt able to perform sit<>stand with min guard assistance while adhering to sternal precautions. Pt able to verbalize sternal precautions and did a great job of adhering to them during functional mobility, ambulation, tasks.     Pertinent Vitals/Pain Pain Assessment: No/denies pain   Extremity/Trunk Assessment Upper Extremity Assessment Upper Extremity Assessment: Generalized weakness (pt with sternal precautions, unable to fully assess)   Lower Extremity Assessment Lower Extremity Assessment: Defer to PT evaluation   Cervical / Trunk Assessment Cervical / Trunk Assessment: Normal   Communication Communication Communication: HOH   Cognition Arousal/Alertness: Awake/alert Behavior During Therapy: WFL for tasks assessed/performed Overall Cognitive Status: Within Functional Limits for tasks assessed       Memory: Decreased short-term memory (per chart, pt with h/o cognitive impairments)             Home Living Family/patient expects to be discharged to:: Skilled nursing facility Living Arrangements: Spouse/significant other (in [place] independent living facility) Available Help at Discharge: [place] Type of Home: Apartment Home Access: Level entry     Home Layout: One level Home Equipment: None   Additional Comments: pt lives with wife at [place] in independent living.Pt coming from SNF post CABGX3 and planning to go back to [facility] SN[facility] post acute d/c.       Prior Functioning/Environment  Level of Independence: Independent  Comments: pt swims 3x/  week and competes    OT Diagnosis: Generalized weakness   OT Problem List: Decreased strength;Decreased activity tolerance;Impaired balance (sitting and/or standing);Decreased safety awareness;Decreased knowledge of use of DME or AE   OT Treatment/Interventions: Self-care/ADL training;Energy conservation;DME and/or AE instruction;Therapeutic activities;Patient/family education;Balance training    OT Goals(Current goals can be found in the care plan section) Acute Rehab OT Goals Patient Stated Goal: return to swimming OT Goal Formulation: With patient/family Time For Goal Achievement: 08/27/15 Potential to Achieve Goals: Good ADL Goals Pt Will Perform Grooming: standing;with modified independence Pt Will Transfer to Toilet: with supervision;ambulating;bedside commode (no RW) Additional ADL Goal #1: Pt will engage in functional ambulation/mobility around room at supervision level without use of AD/RW  OT Frequency: Min 2X/week   Barriers to D/C: Decreased caregiver support   End of Session Equipment Utilized During Treatment: Gait belt;Rolling walker  Activity Tolerance: Patient tolerated treatment well Patient left: in bed;with call bell/phone within reach;with family/visitor present;Other (comment) (seated EOB and PT entering room)   Time: 4650-3546 OT Time Calculation (min): 19 min Charges:  OT General Charges $OT Visit: 1 Procedure OT Evaluation $Initial OT Evaluation Tier I: 1 Procedure  [name],[name] , MS, OTR/L, CLT Pager: [phone]  08/13/2015, 3:30 PM

## 2015-08-13 NOTE — Progress Notes (Signed)
[facility] PROGRESS NOTE   Bush Murdoch EGB:[id number] DOB: May 16, 1976 DOA: 08/12/2015 PCP:  Melinda Crutch, MD  HPI/Subjective: Feels better today, his chest hurts around the incision when he coughs.  Assessment/Plan: Principal Problem:   HCAP (healthcare-associated pneumonia) Active Problems:   Essential hypertension   Coronary artery disease due to lipid rich plaque   Coronary artery disease   Sciatica   S/P CABG (coronary artery bypass graft)   Fever   HLD (hyperlipidemia)    HCAP (healthcare-associated pneumonia) -Patient's cough, fever plus chest x-ray findings are consistent with HCAP -He was not septic on admission. Hemodynamically stable. -Started on broad-spectrum antibiotics, IV Vancomycin and Zosyn. -Placed on mucolytics, antitussives and bronchiolitis. -Follow patient clinically.  CAD:  -S/p of CABG on 08/07/15. Surgical site healing well. No chest pain. 2-D echo on 08/07/15 showed EF of 55-60% -continue ASA, metoprol -consult to wound care for surgical site care.  HTN: -Hold lasix -continue metoprolol  HLD: Last LDL was 78 on 08/02/15. -Continue home medications: Pravastatin and fish oil  Sciatica: -When necessary tramadol   Code Status: Full Code Family Communication: Plan discussed with the patient. Disposition Plan: Remains inpatient Diet: Diet Heart Room service appropriate?: Yes; Fluid consistency:: Thin  Consultants:  None  Procedures:  None  Antibiotics:  Vancomycin and Zosyn   Objective: Filed Vitals:   08/13/15 1041  BP: 123/61  Pulse: 90  Temp:   Resp:     Intake/Output Summary (Last 24 hours) at 08/13/15 1201 Last data filed at 08/13/15 1058  Gross per 24 hour  Intake      0 ml  Output    450 ml  Net   -450 ml   Filed Weights   08/12/15 0154 08/12/15 0731  Weight: 69.088 kg (152 lb 5 oz) 74.753 kg (164 lb 12.8 oz)    Exam: General: Alert and awake, oriented x3, not in any acute distress. HEENT: anicteric  sclera, pupils reactive to light and accommodation, EOMI CVS: S1-S2 clear, no murmur rubs or gallops Chest: clear to auscultation bilaterally, no wheezing, rales or rhonchi Abdomen: soft nontender, nondistended, normal bowel sounds, no organomegaly Extremities: no cyanosis, clubbing or edema noted bilaterally Neuro: Cranial nerves II-XII intact, no focal neurological deficits  Data Reviewed: Basic Metabolic Panel:  Recent Labs Lab 08/07/15 0342  08/07/15 2254 08/08/15 0315 08/08/15 1707 08/08/15 1712 08/09/15 0315 08/10/15 0603 08/12/15 0210  NA 136  < >  --  135 134*  --  133* 135 136  K 3.8  < >  --  4.3 4.2  --  4.0 3.9 4.0  CL 102  < >  --  108 102  --  104 103 105  CO2 27  --   --  23  --   --  24 27 24   GLUCOSE 91  < >  --  124* 159*  --  126* 115* 113*  BUN 16  < >  --  13 18  --  18 26* 24*  CREATININE 1.03  < > 0.98 0.94 1.00 1.17 1.09 0.96 0.96  CALCIUM 9.0  --   --  8.1*  --   --  8.3* 8.5* 8.4*  MG  --   --  2.9* 2.7*  --  2.6*  --   --   --   < > = values in this interval not displayed. Liver Function Tests:  Recent Labs Lab 08/12/15 0210  AST 66*  ALT 55  ALKPHOS 104  BILITOT 0.7  PROT 4.8*  ALBUMIN 2.4*   No results for input(s): LIPASE, AMYLASE in the last 168 hours. No results for input(s): AMMONIA in the last 168 hours. CBC:  Recent Labs Lab 08/08/15 0315 08/08/15 1707 08/08/15 1712 08/09/15 0315 08/10/15 0603 08/12/15 0210  WBC 15.5*  --  18.9* 17.9* 10.2 9.9  NEUTROABS  --   --   --   --   --  6.8  HGB 11.5* 11.2* 11.5* 11.0* 11.2* 10.4*  HCT 33.7* 33.0* 33.6* 32.6* 33.7* 30.8*  MCV 90.1  --  89.4 90.3 91.6 90.9  PLT 143*  --  141* 144* 136* 195   Cardiac Enzymes: No results for input(s): CKTOTAL, CKMB, CKMBINDEX, TROPONINI in the last 168 hours. BNP (last 3 results) No results for input(s): BNP in the last 8760 hours.  ProBNP (last 3 results) No results for input(s): PROBNP in the last 8760 hours.  CBG:  Recent Labs Lab  08/09/15 1917 08/10/15 0622 08/10/15 1123 08/10/15 1624 08/10/15 2124  GLUCAP 121* 103* 121* 107* 104*    Micro Recent Results (from the past 240 hour(s))  MRSA PCR Screening     Status: None   Collection Time: 08/06/15 10:45 AM  Result Value Ref Range Status   MRSA by PCR NEGATIVE NEGATIVE Final    Comment:        The GeneXpert MRSA Assay (FDA approved for NASAL specimens only), is one component of a comprehensive MRSA colonization surveillance program. It is not intended to diagnose MRSA infection nor to guide or monitor treatment for MRSA infections.   Surgical pcr screen     Status: None   Collection Time: 08/06/15  9:18 PM  Result Value Ref Range Status   MRSA, PCR NEGATIVE NEGATIVE Final   Staphylococcus aureus NEGATIVE NEGATIVE Final    Comment:        The Xpert SA Assay (FDA approved for NASAL specimens in patients over 8 years of age), is one component of a comprehensive surveillance program.  Test performance has been validated by [facility] for patients greater than or equal to 79 year old. It is not intended to diagnose infection nor to guide or monitor treatment.   Culture, sputum-assessment     Status: None   Collection Time: 08/13/15  6:13 AM  Result Value Ref Range Status   Specimen Description SPUTUM  Final   Special Requests NONE  Final   Sputum evaluation   Final    MICROSCOPIC FINDINGS SUGGEST THAT THIS SPECIMEN IS NOT REPRESENTATIVE OF LOWER RESPIRATORY SECRETIONS. PLEASE RECOLLECT. Gram Stain Report Called to,Read Back By and Verified With: K. HASTINGS,RN AT [id number] ON [id number] BY Rhea Bleacher    Report Status 08/13/2015 FINAL  Final     Studies: Dg Chest 2 View  08/12/2015   CLINICAL DATA:  Cough and fever.  Recent CABG 08/07/2015  EXAM: CHEST  2 VIEW  COMPARISON:  08/10/2015  FINDINGS: Patient is post median sternotomy and CABG. Mild increase in left pleural effusion and basilar airspace disease compared to prior exam. Improved aeration  in the right lung with near complete resolution of previous right pleural effusion. There is no pulmonary edema. No pneumothorax. Cardiomediastinal contours are unchanged.  IMPRESSION: 1. Mild increase in left pleural effusion and adjacent basilar airspace disease. 2. Improved right lung aeration with near complete resolution of previous right pleural effusion.   Electronically Signed   By: Jeb Levering M.D.   On: 08/12/2015 03:58    Scheduled Meds: . acidophilus  1 capsule Oral Daily  . aspirin EC  81 mg Oral Daily  . donepezil  5 mg Oral QHS  . guaiFENesin  1,200 mg Oral BID  . heparin  5,000 Units Subcutaneous 3 times per day  . ipratropium-albuterol  3 mL Nebulization BID  . metoprolol tartrate  12.5 mg Oral BID  . multivitamin with minerals  1 tablet Oral BID  . piperacillin-tazobactam (ZOSYN)  IV  3.375 g Intravenous 3 times per day  . pravastatin  40 mg Oral QPM  . vancomycin  750 mg Intravenous Q12H   Continuous Infusions:      Time spent: 35 minutes    [facility]  [facility] Pager [phone] If 7PM-7AM, please contact night-coverage at [email or web address], password [facility] 08/13/2015, 12:01 PM  LOS: 1 day

## 2015-08-13 NOTE — Care Management Note (Signed)
Case Management Note  Patient Details  Name: Isaac [name] MRN: [id number] Date of Birth: 07-22-1926  Subjective/Objective:    Date:08/11/15 Spoke with patient at the bedside along with wife, they are from [facility].. Introduced self as case Freight forwarder and explained role in discharge planning and how to be reached. Verified patient lives in town, with spouse , has . Expressed no need for no other DME. Verified patient anticipates to go to SNF at time of discharge.   Patient confirmed or denied needing help with their medication. Patient  is driven by wife to MD appointments. Verified patient has PCP Dr. Harrington Challenger with [facility].   Plan: CM will continue to follow for discharge planning and [facility] resources.                 Action/Plan:   Expected Discharge Date:                  Expected Discharge Plan:  Skilled Nursing Facility  In-House Referral:  Clinical Social Work  Discharge planning Services  CM Consult  Post Acute Care Choice:    Choice offered to:     DME Arranged:    DME Agency:     HH Arranged:    [place] Agency:     Status of Service:  Completed, signed off  Medicare Important Message Given:    Date Medicare IM Given:    Medicare IM give by:    Date Additional Medicare IM Given:    Additional Medicare Important Message give by:     If discussed at [place] of Stay Meetings, dates discussed:    Additional Comments:  Zenon Mayo, RN 08/13/2015, 4:00 PM

## 2015-08-13 NOTE — Progress Notes (Signed)
ANTIBIOTIC CONSULT NOTE - FOLLOW UP  Pharmacy Consult for Vancomycin and Zosyn Indication: rule out pneumonia  No Known Allergies  Patient Measurements: Height: 5\' 9"  (175.3 cm) Weight: 164 lb 12.8 oz (74.753 kg) IBW/kg (Calculated) : 70.7  Vital Signs: Temp: 98.6 F (37 C) (09/19 0405) Temp Source: Oral (09/19 0405) BP: 123/61 mmHg (09/19 1041) Pulse Rate: 90 (09/19 1041) Intake/Output from previous day: 09/18 0701 - 09/19 0700 In: -  Out: 350 [Urine:350] Intake/Output from this shift: Total I/O In: -  Out: 100 [Urine:100]  Labs:  Recent Labs  08/12/15 0210  WBC 9.9  HGB 10.4*  PLT 195  CREATININE 0.96   Estimated Creatinine Clearance: 54.2 mL/min (by C-G formula based on Cr of 0.96). No results for input(s): VANCOTROUGH, VANCOPEAK, VANCORANDOM, GENTTROUGH, GENTPEAK, GENTRANDOM, TOBRATROUGH, TOBRAPEAK, TOBRARND, AMIKACINPEAK, AMIKACINTROU, AMIKACIN in the last 72 hours.   Microbiology: Recent Results (from the past 720 hour(s))  MRSA PCR Screening     Status: None   Collection Time: 08/06/15 10:45 AM  Result Value Ref Range Status   MRSA by PCR NEGATIVE NEGATIVE Final    Comment:        The GeneXpert MRSA Assay (FDA approved for NASAL specimens only), is one component of a comprehensive MRSA colonization surveillance program. It is not intended to diagnose MRSA infection nor to guide or monitor treatment for MRSA infections.   Surgical pcr screen     Status: None   Collection Time: 08/06/15  9:18 PM  Result Value Ref Range Status   MRSA, PCR NEGATIVE NEGATIVE Final   Staphylococcus aureus NEGATIVE NEGATIVE Final    Comment:        The Xpert SA Assay (FDA approved for NASAL specimens in patients over 30 years of age), is one component of a comprehensive surveillance program.  Test performance has been validated by [facility] for patients greater than or equal to 7 year old. It is not intended to diagnose infection nor to guide or monitor  treatment.   Culture, sputum-assessment     Status: None   Collection Time: 08/13/15  6:13 AM  Result Value Ref Range Status   Specimen Description SPUTUM  Final   Special Requests NONE  Final   Sputum evaluation   Final    MICROSCOPIC FINDINGS SUGGEST THAT THIS SPECIMEN IS NOT REPRESENTATIVE OF LOWER RESPIRATORY SECRETIONS. PLEASE RECOLLECT. Gram Stain Report Called to,Read Back By and Verified With: K. HASTINGS,RN AT 0844 ON [id number] BY Rhea Bleacher    Report Status 08/13/2015 FINAL  Final    Anti-infectives    Start     Dose/Rate Route Frequency Ordered Stop   08/13/15 0600  vancomycin (VANCOCIN) IVPB 750 mg/150 ml premix     750 mg 150 mL/hr over 60 Minutes Intravenous Every 24 hours 08/12/15 0700     08/12/15 0730  piperacillin-tazobactam (ZOSYN) IVPB 3.375 g     3.375 g 12.5 mL/hr over 240 Minutes Intravenous 3 times per day 08/12/15 0648     08/12/15 0545  levofloxacin (LEVAQUIN) IVPB 750 mg  Status:  Discontinued     750 mg 100 mL/hr over 90 Minutes Intravenous  Once 08/12/15 0536 08/12/15 0647   08/12/15 0545  vancomycin (VANCOCIN) IVPB 1000 mg/200 mL premix     1,000 mg 200 mL/hr over 60 Minutes Intravenous  Once 08/12/15 0536 08/12/15 0736      Assessment: 81 yo M s/p recent admission for CABG, returned to hospital with new fever/cough.  Currently afebrile, WBC wnl.  Cx data pending. SCr <1 with estimated CrC ~ 50 ml/min.  Goal of Therapy:  Vancomycin trough level 15-20 mcg/ml  Plan:  Increase Vancomycin to 750 mg IV q12h Continue Zosyn 3.375 gm IV q8h (4 hour infusion). Follow-up renal function, cx data, and clinical progress.  [employer], Pharm.D., BCPS Clinical Pharmacist Pager [phone] 08/13/2015 11:22 AM

## 2015-08-13 NOTE — Evaluation (Addendum)
Physical Therapy Evaluation Patient Details Name: Isaac [name] MRN: [id number] DOB: July 13, 1926 Today's Date: 08/13/2015   History of Present Illness  81 yo male s/p recent CABG(08/08/15) adm with HCAP. PMH: CAD, HTN, sciatica  Clinical Impression  Pt admitted with above diagnosis and presents to PT with functional limitations due to deficits listed below (See PT problem list). Pt needs skilled PT to maximize independence and safety to allow discharge to ST-SNF.      Follow Up Recommendations SNF    Equipment Recommendations  None recommended by PT    Recommendations for Other Services       Precautions / Restrictions Precautions Precautions: Sternal;Fall Restrictions Weight Bearing Restrictions: No      Mobility  Bed Mobility Overal bed mobility: Needs Assistance Bed Mobility: Sit to Supine     Supine to sit: Mod assist;HOB elevated Sit to supine: Supervision   General bed mobility comments: Incr time. Pt following sternal precautions without cuing  Transfers Overall transfer level: Needs assistance Equipment used: Rolling walker (2 wheeled) Transfers: Sit to/from Stand Sit to Stand: Min guard         General transfer comment: Assist for balance. Pt following sternal precautions.  Ambulation/Gait Ambulation/Gait assistance: Min guard Ambulation Distance (Feet): 225 Feet Assistive device: None Gait Pattern/deviations: Step-through pattern;Decreased stride length     General Gait Details: Assist for balance  Stairs            Wheelchair Mobility    Modified Rankin (Stroke Patients Only)       Balance Overall balance assessment: Needs assistance Sitting-balance support: No upper extremity supported;Feet supported Sitting balance-Leahy Scale: Good     Standing balance support: No upper extremity supported;During functional activity Standing balance-Leahy Scale: Fair                               Pertinent Vitals/Pain Pain  Assessment: No/denies pain    Home Living Family/patient expects to be discharged to:: Skilled nursing facility Living Arrangements: Spouse/significant other (in [place] independent living facility) Available Help at Discharge: [place] Type of Home: Apartment Home Access: Level entry     Home Layout: One level Home Equipment: None Additional Comments: pt lives with wife at [place] in independent living.Pt coming from SNF post CABGX3 and planning to go back to [facility] SNF post acute d/c.     Prior Function Level of Independence: Independent         Comments: pt swims 3x/ week and competes     Hand Dominance        Extremity/Trunk Assessment   Upper Extremity Assessment: Defer to OT evaluation           Lower Extremity Assessment: Generalized weakness      Cervical / Trunk Assessment: Normal  Communication   Communication: HOH  Cognition Arousal/Alertness: Awake/alert Behavior During Therapy: WFL for tasks assessed/performed Overall Cognitive Status: Within Functional Limits for tasks assessed       Memory: Decreased short-term memory              General Comments      Exercises        Assessment/Plan    PT Assessment Patient needs continued PT services  PT Diagnosis Difficulty walking;Generalized weakness   PT Problem List Decreased strength;Decreased activity tolerance;Decreased balance;Decreased mobility  PT Treatment Interventions DME instruction;Gait training;Functional mobility training;Therapeutic activities;Therapeutic exercise;Balance training;Patient/family education   PT Goals (Current  goals can be found in the Care Plan section) Acute Rehab PT Goals Patient Stated Goal: return to swimming PT Goal Formulation: With patient Time For Goal Achievement: 08/20/15 Potential to Achieve Goals: Good    Frequency Min 3X/week   Barriers to discharge        Co-evaluation                End of Session   Activity Tolerance: Patient tolerated treatment well Patient left: in bed;with call bell/phone within reach;with bed alarm set;with family/visitor present           Time: 7014-1030 PT Time Calculation (min) (ACUTE ONLY): 12 min   Charges:   PT Evaluation $Initial PT Evaluation Tier I: 1 Procedure     PT G Codes:        MAYCOCK,CARY 08/23/2015, 3:47 PM  [facility] PT [phone]

## 2015-08-14 DIAGNOSIS — I970 Postcardiotomy syndrome: Secondary | ICD-10-CM | POA: Diagnosis present

## 2015-08-14 LAB — LEGIONELLA ANTIGEN, URINE

## 2015-08-14 LAB — CBC
HCT: 30.8 % — ABNORMAL LOW (ref 39.0–52.0)
Hemoglobin: 10.5 g/dL — ABNORMAL LOW (ref 13.0–17.0)
MCH: 31.2 pg (ref 26.0–34.0)
MCHC: 34.1 g/dL (ref 30.0–36.0)
MCV: 91.4 fL (ref 78.0–100.0)
PLATELETS: 272 10*3/uL (ref 150–400)
RBC: 3.37 MIL/uL — AB (ref 4.22–5.81)
RDW: 13.9 % (ref 11.5–15.5)
WBC: 9.6 10*3/uL (ref 4.0–10.5)

## 2015-08-14 LAB — BASIC METABOLIC PANEL
ANION GAP: 7 (ref 5–15)
BUN: 14 mg/dL (ref 6–20)
CO2: 26 mmol/L (ref 22–32)
CREATININE: 1.2 mg/dL (ref 0.61–1.24)
Calcium: 8.8 mg/dL — ABNORMAL LOW (ref 8.9–10.3)
Chloride: 103 mmol/L (ref 101–111)
GFR calc non Af Amer: 53 mL/min — ABNORMAL LOW (ref >60)
GLUCOSE: 107 mg/dL — AB (ref 65–99)
POTASSIUM: 3.9 mmol/L (ref 3.5–5.1)
SODIUM: 136 mmol/L (ref 135–145)

## 2015-08-14 MED ORDER — LEVOFLOXACIN 750 MG PO TABS: 750.0000 mg | ORAL_TABLET | Freq: Every day | ORAL | Status: DC

## 2015-08-14 NOTE — Clinical Social Work Note (Signed)
Clinical Social Work Assessment  Patient Details  Name: Isaac [name] MRN: [id number] Date of Birth: 18-Jul-1926  Date of referral:  08/14/15               Reason for consult:  Facility Placement                Permission sought to share information with:  Facility Sport and exercise psychologist, Family Supports Permission granted to share information::  Yes, Verbal Permission Granted  Name::      (Wife [name])  Agency::     Relationship::     Contact Information:     Housing/Transportation Living arrangements for the past 2 months:  [place] of Information:  Patient, Spouse Patient Interpreter Needed:  None Criminal Activity/Legal Involvement Pertinent to Current Situation/Hospitalization:  No - Comment as needed Significant Relationships:  None Lives with:  Spouse Do you feel safe going back to the place where you live?  Yes Need for family participation in patient care:  Yes (Comment) (wife [name] per patient's request)  Care giving concerns:  none   Social Worker assessment / plan:  Patient is from [facility] [employer] [facility] and will return once medically stable.  Wife at bedside states MD will discharge this afternoon.  Patient will be transported via car/wife.  Wife states patient was recently discharged on Saturday 9/17 and was readmitted a few short hours later.  Patient and wife are both agreeable to discharge plan.  Employment status:    Insurance information:  Medicare PT Recommendations:  [place] / Referral to community resources:  [place]  Patient/Family's Response to care:  agreeable  Patient/Family's Understanding of and Emotional Response to Diagnosis, Current Treatment, and Prognosis:  Both remain realistic  Emotional Assessment Appearance:  Appears stated age Attitude/Demeanor/Rapport:   (appropriate) Affect (typically observed):  Accepting, Adaptable Orientation:  Oriented to Self, Oriented to Place,  Oriented to  Time, Oriented to Situation Alcohol / Substance use:  Never Used Psych involvement (Current and /or in the community):  Outpatient Provider  Discharge Needs  Concerns to be addressed:  No discharge needs identified Readmission within the last 30 days:  Yes Current discharge risk:  None Barriers to Discharge:  No Barriers Identified   Dulcy Fanny, LCSW 08/14/2015, 12:14 PM

## 2015-08-14 NOTE — Discharge Planning (Signed)
Patient will discharge today per MD order. Patient will discharge to: [place] RN to call report prior to transportation to: [phone] Transportation: wife/car - will need assistance getting in/out  CSW sent discharge summary to SNF for review.  Packet is complete.  RN, patient and family aware of discharge plans.  Nonnie Done, [place] [phone]  Psychiatric & Orthopedics (5N 1-16) Clinical Social Worker

## 2015-08-14 NOTE — Progress Notes (Signed)
Physical Therapy Treatment Patient Details Name: Milos Milligan MRN: [id number] DOB: 10/28/1926 Today's Date: 08/27/2015    History of Present Illness 81 yo male s/p recent CABG(08/08/15) adm with HCAP. PMH: CAD, HTN, sciatica    PT Comments    Pt making good progress.  Follow Up Recommendations  SNF     Equipment Recommendations  None recommended by PT    Recommendations for Other Services       Precautions / Restrictions Precautions Precautions: Sternal;Fall Restrictions Weight Bearing Restrictions: No    Mobility  Bed Mobility Overal bed mobility: Needs Assistance Bed Mobility: Sit to Supine       Sit to supine: Supervision   General bed mobility comments: Incr time. Pt following sternal precautions without cuing  Transfers Overall transfer level: Needs assistance Equipment used: Rolling walker (2 wheeled) Transfers: Sit to/from Stand Sit to Stand: Min guard         General transfer comment: Assist for balance. Pt following sternal precautions.  Ambulation/Gait Ambulation/Gait assistance: Min guard Ambulation Distance (Feet): 300 Feet Assistive device: Rolling walker (2 wheeled) Gait Pattern/deviations: Step-through pattern;Decreased stride length     General Gait Details: 1 standing rest break   Stairs            Wheelchair Mobility    Modified Rankin (Stroke Patients Only)       Balance   Sitting-balance support: No upper extremity supported;Feet supported Sitting balance-Leahy Scale: Good     Standing balance support: No upper extremity supported Standing balance-Leahy Scale: Fair                      Cognition Arousal/Alertness: Awake/alert Behavior During Therapy: WFL for tasks assessed/performed Overall Cognitive Status: Within Functional Limits for tasks assessed       Memory: Decreased short-term memory              Exercises      General Comments        Pertinent Vitals/Pain Pain Assessment:  No/denies pain    Home Living                      Prior Function            PT Goals (current goals can now be found in the care plan section) Acute Rehab PT Goals Patient Stated Goal: return to swimming PT Goal Formulation: With patient Time For Goal Achievement: 08/20/15 Potential to Achieve Goals: Good Progress towards PT goals: Progressing toward goals    Frequency  Min 3X/week    PT Plan Current plan remains appropriate    Co-evaluation             End of Session   Activity Tolerance: Patient tolerated treatment well Patient left: in bed;with call bell/phone within reach;with bed alarm set;with family/visitor present     Time: 3220-2542 PT Time Calculation (min) (ACUTE ONLY): 23 min  Charges:  $Gait Training: 8-22 mins                    G Codes:      MAYCOCK,CARY 08/27/2015, 12:35 PM [facility] PT [phone]

## 2015-08-14 NOTE — Progress Notes (Signed)
Nsg Discharge Note  Admit Date:  08/12/2015 Discharge date: 08/14/2015   Cedric Fishman to be D/C'd Skilled nursing facility per MD order.  AVS completed.  Copy for chart, and copy for patient signed, and dated. Patient/caregiver able to verbalize understanding.  Discharge Medication:   Medication List    TAKE these medications        aspirin EC 81 MG tablet  Take 81 mg by mouth daily.     donepezil 5 MG tablet  Commonly known as:  ARICEPT  Take 5 mg by mouth at bedtime.     furosemide 40 MG tablet  Commonly known as:  LASIX  Take 1 tablet (40 mg total) by mouth daily. X 5 days     glucosamine-chondroitin 500-400 MG tablet  Take 1 tablet by mouth daily.     KRILL OIL PLUS PO  Take 1 capsule by mouth daily.     levofloxacin 750 MG tablet  Commonly known as:  LEVAQUIN  Take 1 tablet (750 mg total) by mouth daily.     metoprolol tartrate 25 MG tablet  Commonly known as:  LOPRESSOR  Take 0.5 tablets (12.5 mg total) by mouth 2 (two) times daily.     multivitamin with minerals Tabs tablet  Take 1 tablet by mouth 2 (two) times daily.     potassium chloride SA 20 MEQ tablet  Commonly known as:  K-DUR,KLOR-CON  Take 1 tablet (20 mEq total) by mouth daily. X 5 days     pravastatin 40 MG tablet  Commonly known as:  PRAVACHOL  Take 40 mg by mouth every evening.     PROBIOTIC PO  Take 1 tablet by mouth daily.     traMADol 50 MG tablet  Commonly known as:  ULTRAM  Take 1 tablet (50 mg total) by mouth every 4 (four) hours as needed for moderate pain.     Turmeric 500 MG Caps  Take 500 mg by mouth daily.        Discharge Assessment: Filed Vitals:   08/14/15 1411  BP: 105/89  Pulse: 90  Temp: 98.3 F (36.8 C)  Resp:    Skin clean, dry and intact without evidence of skin break down, no evidence of skin tears noted. Scar noted to middle of chest may refer to the doc flowsheets for rest of skin documentation.  IV catheter discontinued intact. Site without signs and  symptoms of complications - no redness or edema noted at insertion site, patient denies c/o pain - only slight tenderness at site.  Dressing with slight pressure applied.  D/c Instructions-Education: Discharge instructions given to patient/family with verbalized understanding. D/c education completed with patient/family including follow up instructions, medication list, d/c activities limitations if indicated, with other d/c instructions as indicated by MD - patient able to verbalize understanding, all questions fully answered. Patient instructed to return to ED, call 911, or call MD for any changes in condition.  Patient escorted via [place], and D/C SNF via private auto. Report called into SNF.   Dayle Points, RN 08/14/2015 2:34 PM

## 2015-08-14 NOTE — Progress Notes (Signed)
MD notified of 7 beat run of V-tach. NO complaints from patient. No shortness of breath noted, no pain or discomfort noted at this time. Will obtain an EKG.

## 2015-08-14 NOTE — Discharge Summary (Signed)
Physician Discharge Summary  Isaac [name] PYP:[id number] DOB: June 12, 1926 DOA: 08/12/2015  PCP:  Melinda Crutch, MD  Admit date: 08/12/2015 Discharge date: 08/14/2015  Time spent: 40 minutes  Recommendations for Outpatient Follow-up:  1. Follow-up with cardiology and CTS as scheduled.  2. Continue levofloxacin for 5 more days.  Discharge Diagnoses:  Principal Problem:   HCAP (healthcare-associated pneumonia) Active Problems:   Essential hypertension   Coronary artery disease due to lipid rich plaque   Coronary artery disease   Sciatica   S/P CABG (coronary artery bypass graft)   Fever   HLD (hyperlipidemia)   Discharge Condition: Stable  Diet recommendation: Heart healthy  Filed Weights   08/12/15 0154 08/12/15 0731  Weight: 69.088 kg (152 lb 5 oz) 74.753 kg (164 lb 12.8 oz)    History of present illness:  Isaac [name] is a 81 y.o. male with PMH of hypertension, hyperlipidemia, CAD, myocardial infarction, s/p of CABG on 08/07/15, sciatica, who presents with fever and cough.  Patient was recently hospitalized from 9/7-9/17/16, and had CABG procedure on 08/07/15 by Dr. Dwaine Deter. Patient was discharged at stable condition. He states that he developed fever and chill and cough last night. He had temperature 101.2. He coughs up white mucus. He has mild shortness of breath. He does not have abdominal pain, nausea, vomiting, diarrhea. He has chronic mild dysuria, which is normal to him and has not changed. Patient does not have rashes, unilateral weakness.  In ED, patient was found to have lactic acid 0.71, negative urinalysis, WBC 9.9, temperature normal, no tachycardia, electrolytes and renal function okay. Chest x-ray showed increased left pleural effusion and adjacent basilar airspace disease.  Hospital Course:   HCAP (healthcare-associated pneumonia) -Patient's cough, fever plus chest x-ray showed opacity in the left lower lobe can't be interpreted as pneumonia. -He was not septic on  admission. Hemodynamically stable. -Started on broad-spectrum antibiotics, IV Vancomycin and Zosyn. -Placed on mucolytics, antitussives and bronchiolitis. -Cardiothoracic surgery so the patient as a courtesy. -Per CTS cannot rule out post pericardiotomy syndrome (although he had surgery 1 week ago Mrs. or common after 2-3 weeks) -Patient has had slight elevation of his procalcitonin so he was treated for pneumonia, continue antibiotics for 5 more days.  CAD:  -S/p of CABG on 08/07/15. Surgical site healing well. No chest pain. 2-D echo on 08/07/15 showed EF of 55-60% -continue ASA, metoprol -Continue surgical site care and sternal precautions.  HTN: -Home medications restarted at time of discharge.  HLD: Last LDL was 78 on 08/02/15. -Continue home medications: Pravastatin and fish oil  Sciatica: -When necessary tramadol  Procedures:  None  Consultations:  None  Discharge Exam: Filed Vitals:   08/14/15 0748  BP:   Pulse: 95  Temp:   Resp: 18   General: Alert and awake, oriented x3, not in any acute distress. HEENT: anicteric sclera, pupils reactive to light and accommodation, EOMI CVS: S1-S2 clear, no murmur rubs or gallops Chest: clear to auscultation bilaterally, no wheezing, rales or rhonchi Abdomen: soft nontender, nondistended, normal bowel sounds, no organomegaly Extremities: no cyanosis, clubbing or edema noted bilaterally Neuro: Cranial nerves II-XII intact, no focal neurological deficits  Discharge Instructions    Current Discharge Medication List    START taking these medications   Details  levofloxacin (LEVAQUIN) 750 MG tablet Take 1 tablet (750 mg total) by mouth daily. Qty: 5 tablet, Refills: 0      CONTINUE these medications which have NOT CHANGED   Details  aspirin EC 81 MG  tablet Take 81 mg by mouth daily.    donepezil (ARICEPT) 5 MG tablet Take 5 mg by mouth at bedtime.    Fish Oil-Krill Oil (KRILL OIL PLUS PO) Take 1 capsule by mouth daily.     furosemide (LASIX) 40 MG tablet Take 1 tablet (40 mg total) by mouth daily. X 5 days Qty: 5 tablet, Refills: 0    glucosamine-chondroitin 500-400 MG tablet Take 1 tablet by mouth daily.    metoprolol tartrate (LOPRESSOR) 25 MG tablet Take 0.5 tablets (12.5 mg total) by mouth 2 (two) times daily.    Multiple Vitamin (MULTIVITAMIN WITH MINERALS) TABS tablet Take 1 tablet by mouth 2 (two) times daily.    potassium chloride SA (K-DUR,KLOR-CON) 20 MEQ tablet Take 1 tablet (20 mEq total) by mouth daily. X 5 days    pravastatin (PRAVACHOL) 40 MG tablet Take 40 mg by mouth every evening.    Probiotic Product (PROBIOTIC PO) Take 1 tablet by mouth daily.    traMADol (ULTRAM) 50 MG tablet Take 1 tablet (50 mg total) by mouth every 4 (four) hours as needed for moderate pain. Qty: 30 tablet, Refills: 0    Turmeric 500 MG CAPS Take 500 mg by mouth daily.       No Known Allergies Follow-up Information    Follow up with Richardson Dopp, PA-C On 08/30/2015.   Specialties:  Physician Assistant, Radiology, Interventional Cardiology   Why:  Appointment is at 11:30 am   Contact information:   [address] [address] [phone]       Follow up with [name], MD On 09/13/2015.   Specialty:  Cardiothoracic Surgery   Why:  PA/LAT CXR to be taken (at [place] which is in the same building as Dr. Everrett Coombe office) on 09/13/2015 at 10:15 am;Appointment is at 11:00 am   Contact information:   [address] [place] [address] [phone]        The results of significant diagnostics from this hospitalization (including imaging, microbiology, ancillary and laboratory) are listed below for reference.    Significant Diagnostic Studies: Dg Chest 2 View  08/12/2015   CLINICAL DATA:  Cough and fever.  Recent CABG 08/07/2015  EXAM: CHEST  2 VIEW  COMPARISON:  08/10/2015  FINDINGS: Patient is post median sternotomy and CABG. Mild  increase in left pleural effusion and basilar airspace disease compared to prior exam. Improved aeration in the right lung with near complete resolution of previous right pleural effusion. There is no pulmonary edema. No pneumothorax. Cardiomediastinal contours are unchanged.  IMPRESSION: 1. Mild increase in left pleural effusion and adjacent basilar airspace disease. 2. Improved right lung aeration with near complete resolution of previous right pleural effusion.   Electronically Signed   By: Jeb Levering M.D.   On: 08/12/2015 03:58   Dg Chest 2 View  08/10/2015   CLINICAL DATA:  Postop CABG.  Chest tube removal.  EXAM: CHEST  2 VIEW  COMPARISON:  08/09/2015 and earlier.  FINDINGS: Sternotomy for CABG. Cardiac silhouette mildly to moderately enlarged. Left chest tube removal with no pneumothorax. Atelectasis in the lower lobes and lingula with improved aeration since yesterday. No new pulmonary parenchymal abnormalities. Pulmonary vascularity normal without evidence pulmonary edema.  IMPRESSION: 1. No pneumothorax after left chest tube removal. 2. Atelectasis in the lower lobes and lingula with improved aeration since yesterday. 3. No new abnormalities.   Electronically Signed   By: Sherran Needs.D.  On: 08/10/2015 08:09   Dg Chest 2 View  08/01/2015   CLINICAL DATA:  Chest pain, question aspiration while swimming today.  EXAM: CHEST  2 VIEW  COMPARISON:  Apr 04, 2014  FINDINGS: The heart size and mediastinal contours are stable. The heart size enlarged. There is coarse mild increased pulmonary interstitium in the left lower lobe unchanged compared to prior exam. There is no focal pneumonia, pulmonary edema, or pleural effusion. The visualized skeletal structures are stable.  IMPRESSION: No active cardiopulmonary disease. Chronic changes of the left lower lobe.   Electronically Signed   By: Abelardo Diesel M.D.   On: 08/01/2015 14:02   Dg Chest Port 1 View  08/09/2015   CLINICAL DATA:  Post CABG   EXAM: PORTABLE CHEST - 1 VIEW  COMPARISON:  Portable chest x-ray of 08/08/2015  FINDINGS: The Swan-Ganz catheter has been removed and a venous sheath remains in the SVC. No pneumothorax is seen. Bibasilar opacities remain left-greater-than-right most consistent with atelectasis, effusions, but pneumonia cannot be excluded particularly at the left lung base. A left chest tube remains and there is a tiny left apical pneumothorax noted. Cardiomegaly is stable.  IMPRESSION: 1. Left chest tube remains with tiny left apical pneumothorax present. 2. Swan-Ganz catheter removed. 3. Bibasilar opacities remain left-greater-than-right consistent with atelectasis, effusions, but pneumonia cannot be excluded.   Electronically Signed   By: Ivar Drape M.D.   On: 08/09/2015 08:04   Dg Chest Port 1 View  08/08/2015   CLINICAL DATA:  CABG.  EXAM: PORTABLE CHEST - 1 VIEW  COMPARISON:  08/07/2015.  FINDINGS: Interim extubation and removal of NG tube. Left chest tube, Swan-Ganz catheter, mediastinal drainage catheter in stable position. Prior CABG. Stable cardiomegaly. Bibasilar atelectasis and/or infiltrates. Small left pleural effusion cannot be excluded.  IMPRESSION: 1. Interim extubation removal of NG tube. Remaining lines and tubes including left chest tube in stable position. No pneumothorax preop 2. Low lung volumes with bibasilar atelectasis and/or infiltrate. Small left pleural effusion cannot be excluded. 3. Prior CABG.  Cardiomegaly.  No pulmonary venous congestion.   Electronically Signed   By: Marcello Moores  Register   On: 08/08/2015 08:04   Dg Chest Port 1 View  08/07/2015   CLINICAL DATA:  Post CABG x3.  EXAM: PORTABLE CHEST - 1 VIEW  COMPARISON:  08/01/2015  FINDINGS: Median sternotomy wires are intact. Endotracheal tube has tip approximately 4.5 cm above the carina. Right IJ Swan-Ganz catheter has tip over the main pulmonary artery segment. Nasogastric tube courses into the region of the stomach as side-port is present  over the stomach in the left upper quadrant and tip is not visualized. Left-sided chest tube appears in adequate position. Mediastinal drain in place.  Lungs are hypoinflated with mild opacification in the left base likely small effusion with atelectasis. Minimal opacification over the medial right base likely atelectasis. No evidence of pneumothorax. Cardiomediastinal silhouette and remainder of the exam is unchanged.  IMPRESSION: Hypoinflation with small left effusion and mild bibasilar atelectasis.  Tubes and lines as described post CABG.   Electronically Signed   By: Marin Olp M.D.   On: 08/07/2015 17:16    Microbiology: Recent Results (from the past 240 hour(s))  MRSA PCR Screening     Status: None   Collection Time: 08/06/15 10:45 AM  Result Value Ref Range Status   MRSA by PCR NEGATIVE NEGATIVE Final    Comment:        The GeneXpert MRSA Assay (FDA approved for  NASAL specimens only), is one component of a comprehensive MRSA colonization surveillance program. It is not intended to diagnose MRSA infection nor to guide or monitor treatment for MRSA infections.   Surgical pcr screen     Status: None   Collection Time: 08/06/15  9:18 PM  Result Value Ref Range Status   MRSA, PCR NEGATIVE NEGATIVE Final   Staphylococcus aureus NEGATIVE NEGATIVE Final    Comment:        The Xpert SA Assay (FDA approved for NASAL specimens in patients over 83 years of age), is one component of a comprehensive surveillance program.  Test performance has been validated by [facility] for patients greater than or equal to 81 year old. It is not intended to diagnose infection nor to guide or monitor treatment.   Culture, blood (x 2)     Status: None (Preliminary result)   Collection Time: 08/12/15 10:20 AM  Result Value Ref Range Status   Specimen Description BLOOD RIGHT ARM  Final   Special Requests BOTTLES DRAWN AEROBIC AND ANAEROBIC 5CC  Final   Culture NO GROWTH 1 DAY  Final   Report  Status PENDING  Incomplete  Culture, blood (x 2)     Status: None (Preliminary result)   Collection Time: 08/12/15 10:30 AM  Result Value Ref Range Status   Specimen Description BLOOD RIGHT HAND  Final   Special Requests BOTTLES DRAWN AEROBIC AND ANAEROBIC 5CC  Final   Culture NO GROWTH 1 DAY  Final   Report Status PENDING  Incomplete  Culture, sputum-assessment     Status: None   Collection Time: 08/13/15  6:13 AM  Result Value Ref Range Status   Specimen Description SPUTUM  Final   Special Requests NONE  Final   Sputum evaluation   Final    MICROSCOPIC FINDINGS SUGGEST THAT THIS SPECIMEN IS NOT REPRESENTATIVE OF LOWER RESPIRATORY SECRETIONS. PLEASE RECOLLECT. Gram Stain Report Called to,Read Back By and Verified With: K. HASTINGS,RN AT 0844 ON [id number] BY Rhea Bleacher    Report Status 08/13/2015 FINAL  Final     Labs: Basic Metabolic Panel:  Recent Labs Lab 08/07/15 2254 08/08/15 0315 08/08/15 1707 08/08/15 1712 08/09/15 0315 08/10/15 0603 08/12/15 0210 08/14/15 0549  NA  --  135 134*  --  133* 135 136 136  K  --  4.3 4.2  --  4.0 3.9 4.0 3.9  CL  --  108 102  --  104 103 105 103  CO2  --  23  --   --  24 27 24 26   GLUCOSE  --  124* 159*  --  126* 115* 113* 107*  BUN  --  13 18  --  18 26* 24* 14  CREATININE 0.98 0.94 1.00 1.17 1.09 0.96 0.96 1.20  CALCIUM  --  8.1*  --   --  8.3* 8.5* 8.4* 8.8*  MG 2.9* 2.7*  --  2.6*  --   --   --   --    Liver Function Tests:  Recent Labs Lab 08/12/15 0210  AST 66*  ALT 55  ALKPHOS 104  BILITOT 0.7  PROT 4.8*  ALBUMIN 2.4*   No results for input(s): LIPASE, AMYLASE in the last 168 hours. No results for input(s): AMMONIA in the last 168 hours. CBC:  Recent Labs Lab 08/08/15 1712 08/09/15 0315 08/10/15 0603 08/12/15 0210 08/14/15 0549  WBC 18.9* 17.9* 10.2 9.9 9.6  NEUTROABS  --   --   --  6.8  --  HGB 11.5* 11.0* 11.2* 10.4* 10.5*  HCT 33.6* 32.6* 33.7* 30.8* 30.8*  MCV 89.4 90.3 91.6 90.9 91.4  PLT 141* 144*  136* 195 272   Cardiac Enzymes: No results for input(s): CKTOTAL, CKMB, CKMBINDEX, TROPONINI in the last 168 hours. BNP: BNP (last 3 results) No results for input(s): BNP in the last 8760 hours.  ProBNP (last 3 results) No results for input(s): PROBNP in the last 8760 hours.  CBG:  Recent Labs Lab 08/09/15 1917 08/10/15 0622 08/10/15 1123 08/10/15 1624 08/10/15 2124  GLUCAP 121* 103* 121* 107* 104*       Signed:  ELMAHI,MUTAZ A  [facility] 08/14/2015, 12:21 PM

## 2015-08-15 ENCOUNTER — Encounter: Payer: Self-pay | Admitting: Nurse Practitioner

## 2015-08-15 ENCOUNTER — Non-Acute Institutional Stay (SKILLED_NURSING_FACILITY): Payer: Medicare Other | Admitting: Nurse Practitioner

## 2015-08-15 DIAGNOSIS — I10 Essential (primary) hypertension: Secondary | ICD-10-CM | POA: Diagnosis not present

## 2015-08-15 DIAGNOSIS — J189 Pneumonia, unspecified organism: Secondary | ICD-10-CM | POA: Diagnosis not present

## 2015-08-15 DIAGNOSIS — F0390 Unspecified dementia without behavioral disturbance: Secondary | ICD-10-CM

## 2015-08-15 DIAGNOSIS — I2510 Atherosclerotic heart disease of native coronary artery without angina pectoris: Secondary | ICD-10-CM | POA: Diagnosis not present

## 2015-08-15 DIAGNOSIS — I2583 Coronary atherosclerosis due to lipid rich plaque: Secondary | ICD-10-CM

## 2015-08-15 NOTE — Assessment & Plan Note (Signed)
Continue Donepezil 5mg , update MMSE

## 2015-08-15 NOTE — Assessment & Plan Note (Signed)
-  S/p of CABG on 08/07/15. Surgical site healing well. No chest pain. 2-D echo on 08/07/15 showed EF of 55-60%  -continue ASA, metoprol  -Continue surgical site care and sternal precautions.

## 2015-08-15 NOTE — Assessment & Plan Note (Addendum)
Complete 5 day course of Levaquin in SNF, f/u CTS rule out post pericardiotomy syndrome

## 2015-08-15 NOTE — Progress Notes (Signed)
Patient ID: Isaac [name], male   DOB: 08/19/1926, 81 y.o.   MRN: [id number]  Location:  [name] Isaac [name]:  Isaac Latus NP  Code Status:  Full code Goals of care: Isaac Directive information    Chief Complaint  Patient presents with  . Medical Management of Chronic Issues  . Hospitalization Follow-up     HPI: Patient is a 81 y.o. male seen in the [facility] at [name] today for evaluation of  Hospitalized 08/12/2015-08/14/2015 for HCAP in ED Chest x-ray showed increased left pleural effusion and adjacent basilar airspace disease. He is discharged with continue levofloxacin for 5 more days. Prior hospitalization 08/01/15-08/11/15 for 08/07/15 CABG Follow-up with cardiology and CTS as scheduled.  Review of Systems:  Review of Systems  Constitutional: Negative for fever, chills, weight loss, malaise/fatigue and diaphoresis.  HENT: Positive for hearing loss. Negative for congestion, ear discharge, ear pain, nosebleeds, sore throat and tinnitus.   Eyes: Negative for blurred vision, double vision, photophobia, pain, discharge and redness.  Respiratory: Positive for cough. Negative for stridor.        Occasional cough, non productive.  Cardiovascular: Negative for chest pain, palpitations, orthopnea, claudication, leg swelling and PND.  Gastrointestinal: Negative for heartburn, nausea, vomiting, abdominal pain, diarrhea and constipation.  Genitourinary: Positive for frequency. Negative for dysuria and urgency.       Use urinal at night.   Musculoskeletal: Negative for myalgias, back pain, joint pain and neck pain.  Skin: Negative for itching and rash.       Mid sternal surgical incision is healed.   Neurological: Negative for dizziness, tingling, tremors, sensory change, speech change, focal weakness, seizures, loss of consciousness, weakness and headaches.  Endo/Heme/Allergies: Negative for environmental allergies and polydipsia. Does not bruise/bleed easily.  Psychiatric/Behavioral:  Positive for memory loss. Negative for depression, suicidal ideas, hallucinations and substance abuse. The patient is not nervous/anxious and does not have insomnia.     Past Medical History  Diagnosis Date  . MI (myocardial infarction)   . Sciatica   . Hypertension   . Coronary artery disease     Patient Active Problem List   Diagnosis Date Noted  . Senile dementia 08/15/2015  . 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 S/P CABG (coronary artery bypass graft) 08/12/2015  . Fever 08/12/2015  . HCAP (healthcare-associated pneumonia) 08/12/2015  . HLD (hyperlipidemia) 08/12/2015  . Coronary artery disease   . Sciatica   . MI (myocardial infarction)   . CAD (coronary artery disease) 08/07/2015  . Coronary artery disease involving native coronary artery of native heart without angina pectoris   . Throat pain 08/01/2015  . Elevated troponin 08/01/2015  . Hypertensive urgency 08/01/2015  . NSTEMI (non-ST elevated myocardial infarction)   . Essential hypertension 07/06/2015  . Coronary artery disease due to lipid rich plaque 07/06/2015  . Old MI (myocardial infarction) 07/06/2015    No Known Allergies  Medications: Patient's Medications  New Prescriptions   No medications on file  Previous Medications   ASPIRIN EC 81 MG TABLET    Take 81 mg by mouth daily.   DONEPEZIL (ARICEPT) 5 MG TABLET    Take 5 mg by mouth at bedtime.   FISH OIL-KRILL OIL (KRILL OIL PLUS PO)    Take 1 capsule by mouth daily.   FUROSEMIDE (LASIX) 40 MG TABLET    Take 1 tablet (40 mg total) by mouth daily. X 5 days   GLUCOSAMINE-CHONDROITIN 500-400 MG TABLET    Take 1 tablet by mouth daily.  LEVOFLOXACIN (LEVAQUIN) 750 MG TABLET    Take 1 tablet (750 mg total) by mouth daily.   METOPROLOL TARTRATE (LOPRESSOR) 25 MG TABLET    Take 0.5 tablets (12.5 mg total) by mouth 2 (two) times daily.   MULTIPLE VITAMIN (MULTIVITAMIN WITH MINERALS) TABS TABLET    Take 1 tablet by mouth 2 (two) times daily.   POTASSIUM  CHLORIDE SA (K-DUR,KLOR-CON) 20 MEQ TABLET    Take 1 tablet (20 mEq total) by mouth daily. X 5 days   PRAVASTATIN (PRAVACHOL) 40 MG TABLET    Take 40 mg by mouth every evening.   PROBIOTIC PRODUCT (PROBIOTIC PO)    Take 1 tablet by mouth daily.   TRAMADOL (ULTRAM) 50 MG TABLET    Take 1 tablet (50 mg total) by mouth every 4 (four) hours as needed for moderate pain.   TURMERIC 500 MG CAPS    Take 500 mg by mouth daily.  Modified Medications   No medications on file  Discontinued Medications   No medications on file    Physical Exam: Filed Vitals:   08/15/15 1324  BP: 117/69  Pulse: 82  Temp: 98.2 F (36.8 C)  TempSrc: Tympanic  Resp: 20   There is no weight on file to calculate BMI.  Physical Exam  Constitutional: He is oriented to person, place, and time. He appears well-developed and well-nourished. No distress.  HENT:  Head: Normocephalic and atraumatic.  Right Ear: External ear normal.  Left Ear: External ear normal.  Nose: Nose normal.  Eyes: Conjunctivae and EOM are normal. Pupils are equal, round, and reactive to light. Right eye exhibits no discharge. Left eye exhibits no discharge.  Neck: Normal range of motion. Neck supple. No JVD present. No tracheal deviation present. No thyromegaly present.  Cardiovascular: Normal rate.  Exam reveals no friction rub.   No murmur heard. Pulmonary/Chest: Effort normal and breath sounds normal. No respiratory distress. He has no wheezes. He has no rales.  Abdominal: Soft. Bowel sounds are normal. He exhibits no distension and no mass. There is no tenderness.  Musculoskeletal: Normal range of motion. He exhibits no edema or tenderness.  Lymphadenopathy:    He has no cervical adenopathy.  Neurological: He is alert and oriented to person, place, and time. He has normal reflexes.  Skin: Skin is warm and dry. No rash noted. He is not diaphoretic. No erythema. No pallor.  Mid sternal surgical incision is healed.   Psychiatric: He has a  normal mood and affect. His behavior is normal. Judgment and thought content normal.    Labs reviewed: Basic Metabolic Panel:  Recent Labs  08/07/15 2254 08/08/15 0315  08/08/15 1712  08/10/15 0603 08/12/15 0210 08/14/15 0549  NA  --  135  < >  --   < > 135 136 136  K  --  4.3  < >  --   < > 3.9 4.0 3.9  CL  --  108  < >  --   < > 103 105 103  CO2  --  23  --   --   < > 27 24 26   GLUCOSE  --  124*  < >  --   < > 115* 113* 107*  BUN  --  13  < >  --   < > 26* 24* 14  CREATININE 0.98 0.94  < > 1.17  < > 0.96 0.96 1.20  CALCIUM  --  8.1*  --   --   < >  8.5* 8.4* 8.8*  MG 2.9* 2.7*  --  2.6*  --   --   --   --   < > = values in this interval not displayed.  Liver Function Tests:  Recent Labs  08/12/15 0210  AST 66*  ALT 55  ALKPHOS 104  BILITOT 0.7  PROT 4.8*  ALBUMIN 2.4*    CBC:  Recent Labs  08/10/15 0603 08/12/15 0210 08/14/15 0549  WBC 10.2 9.9 9.6  NEUTROABS  --  6.8  --   HGB 11.2* 10.4* 10.5*  HCT 33.7* 30.8* 30.8*  MCV 91.6 90.9 91.4  PLT 136* 195 272    Lab Results  Component Value Date   TSH 2.646 08/01/2015   Lab Results  Component Value Date   HGBA1C 5.7* 08/06/2015   Lab Results  Component Value Date   CHOL 133 08/02/2015   HDL 42 08/02/2015   LDLCALC 78 08/02/2015   TRIG 64 08/02/2015   CHOLHDL 3.2 08/02/2015    Significant Diagnostic Results since last visit: none  Patient Care Team: Lona Kettle, MD as PCP - General (Family Medicine)  Assessment/Plan Problem List Items Addressed This Visit    Essential hypertension    Controlled, continue Metoprolol 12.5mg  bid.       CAD (coronary artery disease)    -S/p of CABG on 08/07/15. Surgical site healing well. No chest pain. 2-D echo on 08/07/15 showed EF of 55-60%  -continue ASA, metoprol  -Continue surgical site care and sternal precautions.       HCAP (healthcare-associated pneumonia)    Complete 5 day course of Levaquin in [facility], f/u CTS rule out post pericardiotomy syndrome        Senile dementia - Primary    Continue Donepezil 5mg , update MMSE          Family/ staff Communication: observe the patient, rehab, [place] to [place] when able.   Labs/tests ordered: CBC, CMP, TSH  ManXie Mast NP [facility] [address], [place] [address] On Call:  [phone] & follow prompts after 5pm & weekends Office Phone:  [phone] Office Fax:  [phone]

## 2015-08-15 NOTE — Assessment & Plan Note (Signed)
Controlled, continue Metoprolol 12.5mg bid.  

## 2015-08-17 ENCOUNTER — Encounter: Payer: Self-pay | Admitting: Internal Medicine

## 2015-08-17 ENCOUNTER — Non-Acute Institutional Stay (SKILLED_NURSING_FACILITY): Payer: Medicare Other | Admitting: Internal Medicine

## 2015-08-17 DIAGNOSIS — I2510 Atherosclerotic heart disease of native coronary artery without angina pectoris: Secondary | ICD-10-CM

## 2015-08-17 DIAGNOSIS — F0390 Unspecified dementia without behavioral disturbance: Secondary | ICD-10-CM | POA: Diagnosis not present

## 2015-08-17 DIAGNOSIS — J189 Pneumonia, unspecified organism: Secondary | ICD-10-CM | POA: Diagnosis not present

## 2015-08-17 DIAGNOSIS — R070 Pain in throat: Secondary | ICD-10-CM

## 2015-08-17 DIAGNOSIS — I10 Essential (primary) hypertension: Secondary | ICD-10-CM

## 2015-08-17 DIAGNOSIS — I2583 Coronary atherosclerosis due to lipid rich plaque: Secondary | ICD-10-CM

## 2015-08-17 LAB — CULTURE, BLOOD (ROUTINE X 2)
Culture: NO GROWTH
Culture: NO GROWTH

## 2015-08-20 NOTE — Progress Notes (Signed)
Patient ID: Isaac [name], male   DOB: 1926-02-22, 81 y.o.   MRN: [id number]    HISTORY AND PHYSICAL  Location:  [facility] Room Number: 17 Place of Service: SNF [phone])   Extended Emergency Contact Information Primary Emergency Contact: Ketcherside,[name] Address: [address]          [place], [place] [address] [place] Phone: [phone] Relation: Spouse  Advanced Directive information Does patient have an advance directive?: No  Chief Complaint  Patient presents with  . New Admit To SNF    following hospitalization 08/12/15 to 08/14/15 for Pneumonia    HPI:  Patient was initially admitted to this facility 08/11/15 following a prolonged hospital stay. He had been hospitalized on 08/01/15 through 08/11/2015. He had CABG procedure on 08/07/15 by Dr. Dwaine Deter. Following his discharge to the nursing home, he was here only about 8 hours, when he began having a temperature of 101.2, chills, and coughing of white mucus. Dyspnea increased. He was sent to the emergency room and was readmitted to the hospital for healthcare associated pneumonia in the [facility] on 08/12/2015. He remained there until 08/14/2015, at which time he was sent back to the skilled nursing facility, [facility]  He is now admitted to our facility for support while he gained strength as result of his 2 recent hospitalizations. Patient states that he is no longer coughing as much. He denies any chest pain. Wounds are healing well from his previous CABG.  Other past medical history indicates diagnoses dementia, he is able to carry on conversation at the present time and is coherent in his speech. Diagnoses appears to have been entered 08/15/2015 when he returned our facility on donepezil. Medications listed in the emergency room visit 11/18/2014 also included donepezil. Dr. Melinda Crutch has been his previous primary care physician.  Past Medical History  Diagnosis Date  . MI (myocardial  infarction)   . Sciatica   . Hypertension   . Coronary artery disease   . Coronary atherosclerosis of artery bypass graft   . Methicillin susceptible Staphylococcus aureus pneumonia   . Pure hypercholesterolemia   . Dementia without behavioral disturbance   . Alzheimer's disease     Past Surgical History  Procedure Laterality Date  . Coronary stent placement    . Cholecystectomy    . Hernia repair    . Cardiac catheterization N/A 08/03/2015    Procedure: Left Heart Cath and Coronary Angiography;  Surgeon: Peter M Martinique, MD;  Location: [facility];  Service: Cardiovascular;  Laterality: N/A;  . Cardiac catheterization  08/03/2015    Procedure: Intravascular Pressure Wire/FFR Study;  Surgeon: Peter M Martinique, MD;  Location: [facility];  Service: Cardiovascular;;  . Coronary artery bypass graft N/A 08/07/2015    Procedure: CORONARY ARTERY BYPASS GRAFTING (CABG) x 3 with Endoscopic Vein Harvesting of greater saphenous vein from bilateral thighs;  Surgeon: [name], MD;  Location: [facility];  Service: Open Heart Surgery;  Laterality: N/A;  . Tee without cardioversion N/A 08/07/2015    Procedure: TRANSESOPHAGEAL ECHOCARDIOGRAM (TEE);  Surgeon: [name], MD;  Location: [facility];  Service: Open Heart Surgery;  Laterality: N/A;    Patient Care Team: Lona Kettle, MD as PCP - General (Family Medicine)  Social History   Social History  . Marital Status: Married    Spouse Name: N/A  . Number of Children: N/A  . Years of Education: N/A   Occupational History  .  Not on file.   Social History Main Topics  . Smoking status: Never Smoker   . Smokeless tobacco: Never Used  . Alcohol Use: No  . Drug Use: No  . Sexual Activity: Not on file   Other Topics Concern  . Not on file   Social History Narrative   Lives at [facility] since 08/11/15 in [place]   Married wife [name]   Never smoked   Alcohol none    reports that he has never smoked. He has never used  smokeless tobacco. He reports that he does not drink alcohol or use illicit drugs.  Family History  Problem Relation Age of Onset  . Heart attack Father    Family Status  Relation Status Death Age  . Brother Alive   . Daughter Alive   . Son Alive   . Daughter Alive     There is no immunization history for the selected administration types on file for this patient.  No Known Allergies  Medications: Patient's Medications  New Prescriptions   No medications on file  Previous Medications   ASPIRIN EC 81 MG TABLET    Take 81 mg by mouth daily.   DONEPEZIL (ARICEPT) 5 MG TABLET    Take 5 mg by mouth at bedtime.   FISH OIL-KRILL OIL (KRILL OIL PLUS PO)    Take 1 capsule by mouth daily.   FUROSEMIDE (LASIX) 40 MG TABLET    Take 1 tablet (40 mg total) by mouth daily. X 5 days   GLUCOSAMINE-CHONDROITIN 500-400 MG TABLET    Take 1 tablet by mouth daily.   LEVOFLOXACIN (LEVAQUIN) 750 MG TABLET    Take 1 tablet (750 mg total) by mouth daily.   METOPROLOL TARTRATE (LOPRESSOR) 25 MG TABLET    Take 0.5 tablets (12.5 mg total) by mouth 2 (two) times daily.   MULTIPLE VITAMIN (MULTIVITAMIN WITH MINERALS) TABS TABLET    Take 1 tablet by mouth 2 (two) times daily.   POTASSIUM CHLORIDE SA (K-DUR,KLOR-CON) 20 MEQ TABLET    Take 1 tablet (20 mEq total) by mouth daily. X 5 days   PRAVASTATIN (PRAVACHOL) 40 MG TABLET    Take 40 mg by mouth every evening.   PROBIOTIC PRODUCT (PROBIOTIC PO)    Take 1 tablet by mouth daily.   TRAMADOL (ULTRAM) 50 MG TABLET    Take 1 tablet (50 mg total) by mouth every 4 (four) hours as needed for moderate pain.   TURMERIC 500 MG CAPS    Take 500 mg by mouth daily.  Modified Medications   No medications on file  Discontinued Medications   No medications on file    Review of Systems  Constitutional: Negative for fever, chills and diaphoresis.  HENT: Positive for hearing loss. Negative for congestion, ear discharge, ear pain, nosebleeds, sore throat and tinnitus.     Eyes: Negative for photophobia, pain, discharge and redness.  Respiratory: Negative for cough, chest tightness, shortness of breath and stridor.        Diagnosed with healthcare acquired pneumonia 08/12/2015  Cardiovascular: Negative for chest pain, palpitations and leg swelling.       CABG on 08/07/2015  Gastrointestinal: Negative for nausea, vomiting, abdominal pain, diarrhea and constipation.  Endocrine: Negative.  Negative for polydipsia.  Genitourinary: Positive for frequency. Negative for dysuria and urgency.       Use urinal at night.   Musculoskeletal: Negative for myalgias, back pain and neck pain.  Skin: Negative for rash.  Mid sternal surgical incision is healed.   Allergic/Immunologic: Negative for environmental allergies.  Neurological: Negative for dizziness, tremors, seizures, weakness and headaches.       History of mild memory deficit  Hematological: Does not bruise/bleed easily.  Psychiatric/Behavioral: Negative for suicidal ideas and hallucinations. The patient is not nervous/anxious.     Filed Vitals:   08/17/15 1412  BP: 116/58  Pulse: 71  Temp: 98.8 F (37.1 C)  Resp: 19  Weight: 162 lb (73.483 kg)   Body mass index is 23.91 kg/(m^2).  Physical Exam  Constitutional: He is oriented to person, place, and time. He appears well-developed and well-nourished. No distress.  HENT:  Head: Normocephalic and atraumatic.  Right Ear: External ear normal.  Left Ear: External ear normal.  Nose: Nose normal.  Eyes: Conjunctivae and EOM are normal. Pupils are equal, round, and reactive to light. Right eye exhibits no discharge. Left eye exhibits no discharge.  Neck: Normal range of motion. Neck supple. No JVD present. No tracheal deviation present. No thyromegaly present.  Cardiovascular: Normal rate.  Exam reveals no friction rub.   No murmur heard. Pulmonary/Chest: Effort normal and breath sounds normal. No respiratory distress. He has no wheezes. He has no  rales.  Abdominal: Soft. Bowel sounds are normal. He exhibits no distension and no mass. There is no tenderness.  Musculoskeletal: Normal range of motion. He exhibits no edema or tenderness.  Lymphadenopathy:    He has no cervical adenopathy.  Neurological: He is alert and oriented to person, place, and time. He has normal reflexes.  Skin: Skin is warm and dry. No rash noted. He is not diaphoretic. No erythema. No pallor.  Mid sternal surgical incision is healed.   Psychiatric: He has a normal mood and affect. His behavior is normal. Judgment and thought content normal.    Labs reviewed: Lab Summary Latest Ref Rng 08/14/2015 08/12/2015 08/10/2015  Hemoglobin 13.0 - 17.0 g/dL 10.5(L) 10.4(L) 11.2(L)  Hematocrit 39.0 - 52.0 % 30.8(L) 30.8(L) 33.7(L)  White count 4.0 - 10.5 K/uL 9.6 9.9 10.2  Platelet count 150 - 400 K/uL 272 195 136(L)  Sodium 135 - 145 mmol/L 136 136 135  Potassium 3.5 - 5.1 mmol/L 3.9 4.0 3.9  Calcium 8.9 - 10.3 mg/dL 8.8(L) 8.4(L) 8.5(L)  Phosphorus - (None) (None) (None)  Creatinine 0.61 - 1.24 mg/dL 1.20 0.96 0.96  AST 15 - 41 U/L (None) 66(H) (None)  Alk Phos 38 - 126 U/L (None) 104 (None)  Bilirubin 0.3 - 1.2 mg/dL (None) 0.7 (None)  Glucose 65 - 99 mg/dL 107(H) 113(H) 115(H)  Cholesterol - (None) (None) (None)  HDL cholesterol - (None) (None) (None)  Triglycerides - (None) (None) (None)  LDL Direct - (None) (None) (None)  LDL Calc - (None) (None) (None)  Total protein 6.5 - 8.1 g/dL (None) 4.8(L) (None)  Albumin 3.5 - 5.0 g/dL (None) 2.4(L) (None)   Lab Results  Component Value Date   BUN 14 08/14/2015   Lab Results  Component Value Date   HGBA1C 5.7* 08/06/2015   Lab Results  Component Value Date   TSH 2.646 08/01/2015          Dg Chest 2 View  08/12/2015   CLINICAL DATA:  Cough and fever.  Recent CABG 08/07/2015  EXAM: CHEST  2 VIEW  COMPARISON:  08/10/2015  FINDINGS: Patient is post median sternotomy and CABG. Mild increase in left pleural  effusion and basilar airspace disease compared to prior exam. Improved aeration in the right lung  with near complete resolution of previous right pleural effusion. There is no pulmonary edema. No pneumothorax. Cardiomediastinal contours are unchanged.  IMPRESSION: 1. Mild increase in left pleural effusion and adjacent basilar airspace disease. 2. Improved right lung aeration with near complete resolution of previous right pleural effusion.   Electronically Signed   By: Jeb Levering M.D.   On: 08/12/2015 03:58   Dg Chest 2 View  08/10/2015   CLINICAL DATA:  Postop CABG.  Chest tube removal.  EXAM: CHEST  2 VIEW  COMPARISON:  08/09/2015 and earlier.  FINDINGS: Sternotomy for CABG. Cardiac silhouette mildly to moderately enlarged. Left chest tube removal with no pneumothorax. Atelectasis in the lower lobes and lingula with improved aeration since yesterday. No new pulmonary parenchymal abnormalities. Pulmonary vascularity normal without evidence pulmonary edema.  IMPRESSION: 1. No pneumothorax after left chest tube removal. 2. Atelectasis in the lower lobes and lingula with improved aeration since yesterday. 3. No new abnormalities.   Electronically Signed   By: Evangeline Dakin M.D.   On: 08/10/2015 08:09   Dg Chest 2 View  08/01/2015   CLINICAL DATA:  Chest pain, question aspiration while swimming today.  EXAM: CHEST  2 VIEW  COMPARISON:  Apr 04, 2014  FINDINGS: The heart size and mediastinal contours are stable. The heart size enlarged. There is coarse mild increased pulmonary interstitium in the left lower lobe unchanged compared to prior exam. There is no focal pneumonia, pulmonary edema, or pleural effusion. The visualized skeletal structures are stable.  IMPRESSION: No active cardiopulmonary disease. Chronic changes of the left lower lobe.   Electronically Signed   By: Abelardo Diesel M.D.   On: 08/01/2015 14:02   Dg Chest Port 1 View  08/09/2015   CLINICAL DATA:  Post CABG  EXAM: PORTABLE CHEST - 1  VIEW  COMPARISON:  Portable chest x-ray of 08/08/2015  FINDINGS: The Swan-Ganz catheter has been removed and a venous sheath remains in the SVC. No pneumothorax is seen. Bibasilar opacities remain left-greater-than-right most consistent with atelectasis, effusions, but pneumonia cannot be excluded particularly at the left lung base. A left chest tube remains and there is a tiny left apical pneumothorax noted. Cardiomegaly is stable.  IMPRESSION: 1. Left chest tube remains with tiny left apical pneumothorax present. 2. Swan-Ganz catheter removed. 3. Bibasilar opacities remain left-greater-than-right consistent with atelectasis, effusions, but pneumonia cannot be excluded.   Electronically Signed   By: Ivar Drape M.D.   On: 08/09/2015 08:04   Dg Chest Port 1 View  08/08/2015   CLINICAL DATA:  CABG.  EXAM: PORTABLE CHEST - 1 VIEW  COMPARISON:  08/07/2015.  FINDINGS: Interim extubation and removal of NG tube. Left chest tube, Swan-Ganz catheter, mediastinal drainage catheter in stable position. Prior CABG. Stable cardiomegaly. Bibasilar atelectasis and/or infiltrates. Small left pleural effusion cannot be excluded.  IMPRESSION: 1. Interim extubation removal of NG tube. Remaining lines and tubes including left chest tube in stable position. No pneumothorax preop 2. Low lung volumes with bibasilar atelectasis and/or infiltrate. Small left pleural effusion cannot be excluded. 3. Prior CABG.  Cardiomegaly.  No pulmonary venous congestion.   Electronically Signed   By: Marcello Moores  Register   On: 08/08/2015 08:04   Dg Chest Port 1 View  08/07/2015   CLINICAL DATA:  Post CABG x3.  EXAM: PORTABLE CHEST - 1 VIEW  COMPARISON:  08/01/2015  FINDINGS: Median sternotomy wires are intact. Endotracheal tube has tip approximately 4.5 cm above the carina. Right IJ Swan-Ganz catheter has tip  over the main pulmonary artery segment. Nasogastric tube courses into the region of the stomach as side-port is present over the stomach in the  left upper quadrant and tip is not visualized. Left-sided chest tube appears in adequate position. Mediastinal drain in place.  Lungs are hypoinflated with mild opacification in the left base likely small effusion with atelectasis. Minimal opacification over the medial right base likely atelectasis. No evidence of pneumothorax. Cardiomediastinal silhouette and remainder of the exam is unchanged.  IMPRESSION: Hypoinflation with small left effusion and mild bibasilar atelectasis.  Tubes and lines as described post CABG.   Electronically Signed   By: Marin Olp M.D.   On: 08/07/2015 17:16     Assessment/Plan  1. HCAP (healthcare-associated pneumonia) Continue levofloxacin 750 mg daily through 08/19/2015.  2. Coronary artery disease due to lipid rich plaque Status post CABG. Wounds are healing well.  3. Essential hypertension Controlled  4. Senile dementia, without behavioral disturbance This is my first opportunity to meet this patient. I'm skeptical of this diagnosis, but as I get to know him better, it may be confirmed. MMSE has been ordered.  5. Throat pain Resolved. Was likely due to intubation.

## 2015-08-21 LAB — BASIC METABOLIC PANEL
BUN: 15 mg/dL (ref 4–21)
Creatinine: 1 mg/dL (ref 0.6–1.3)
GLUCOSE: 111 mg/dL
POTASSIUM: 4.8 mmol/L (ref 3.4–5.3)
SODIUM: 137 mmol/L (ref 137–147)

## 2015-08-21 LAB — CBC AND DIFFERENTIAL
HEMATOCRIT: 35 % — AB (ref 41–53)
HEMOGLOBIN: 12.1 g/dL — AB (ref 13.5–17.5)
Platelets: 596 10*3/uL — AB (ref 150–399)
WBC: 13.7 10^3/mL

## 2015-08-21 LAB — HEPATIC FUNCTION PANEL
ALK PHOS: 101 U/L (ref 25–125)
ALT: 38 U/L (ref 10–40)
AST: 23 U/L (ref 14–40)
BILIRUBIN, TOTAL: 0.5 mg/dL

## 2015-08-21 LAB — TSH: TSH: 7.23 u[IU]/mL — AB (ref 0.41–5.90)

## 2015-08-22 ENCOUNTER — Non-Acute Institutional Stay (SKILLED_NURSING_FACILITY): Payer: Medicare Other | Admitting: Nurse Practitioner

## 2015-08-22 ENCOUNTER — Encounter: Payer: Self-pay | Admitting: Nurse Practitioner

## 2015-08-22 DIAGNOSIS — E039 Hypothyroidism, unspecified: Secondary | ICD-10-CM | POA: Diagnosis not present

## 2015-08-22 DIAGNOSIS — F0390 Unspecified dementia without behavioral disturbance: Secondary | ICD-10-CM | POA: Diagnosis not present

## 2015-08-22 DIAGNOSIS — I10 Essential (primary) hypertension: Secondary | ICD-10-CM

## 2015-08-22 NOTE — Assessment & Plan Note (Signed)
Continue Donepezil 5mg , update MMSE

## 2015-08-22 NOTE — Progress Notes (Signed)
Patient ID: Isaac [name], male   DOB: 1926/08/22, 81 y.o.   MRN: [id number]  Location:  SNF [facility] Provider:  [name] NP  Code Status:  DNR Goals of care: Advanced Directive information    Chief Complaint  Patient presents with  . Medical Management of Chronic Issues  . Acute Visit    elevated TSH     HPI: Patient is a 81 y.o. male seen in the SNF at [facility] today for evaluation of  Elevated TSH 7.232 08/21/15, no Hx of hypothyroidism, denied chest pain, palpitation, fatigue, or hair loss.   Review of Systems:  Review of Systems  Constitutional: Negative for fever, chills and diaphoresis.  HENT: Positive for hearing loss. Negative for congestion, ear discharge, ear pain, nosebleeds and sore throat.   Eyes: Negative for pain, discharge and redness.  Respiratory: Negative for cough, shortness of breath and stridor.        Diagnosed with healthcare acquired pneumonia 08/12/2015  Cardiovascular: Negative for chest pain, palpitations and leg swelling.       CABG on 08/07/2015  Gastrointestinal: Negative for nausea, vomiting, abdominal pain, diarrhea and constipation.  Genitourinary: Positive for frequency. Negative for dysuria and urgency.       Use urinal at night.   Musculoskeletal: Negative for myalgias, back pain and neck pain.  Skin: Negative for rash.       Mid sternal surgical incision is healed.   Neurological: Negative for dizziness, tremors, seizures, weakness and headaches.       History of mild memory deficit  Endo/Heme/Allergies: Negative for polydipsia.  Psychiatric/Behavioral: Positive for memory loss. Negative for suicidal ideas and hallucinations. The patient is not nervous/anxious.     Past Medical History  Diagnosis Date  . MI (myocardial infarction)   . Sciatica   . Hypertension   . Coronary artery disease   . Coronary atherosclerosis of artery bypass graft   . Methicillin susceptible Staphylococcus aureus pneumonia   . Pure  hypercholesterolemia   . Dementia without behavioral disturbance   . Alzheimer's disease     Patient Active Problem List   Diagnosis Date Noted  . Hypothyroidism 08/22/2015  . Senile dementia 08/15/2015  . 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 S/P CABG (coronary artery bypass graft) 08/12/2015  . Fever 08/12/2015  . HCAP (healthcare-associated pneumonia) 08/12/2015  . HLD (hyperlipidemia) 08/12/2015  . Sciatica   . MI (myocardial infarction)   . CAD (coronary artery disease) 08/07/2015  . Elevated troponin 08/01/2015  . NSTEMI (non-ST elevated myocardial infarction)   . Essential hypertension 07/06/2015  . Coronary artery disease due to lipid rich plaque 07/06/2015  . Old MI (myocardial infarction) 07/06/2015    No Known Allergies  Medications: Patient's Medications  New Prescriptions   No medications on file  Previous Medications   ASPIRIN EC 81 MG TABLET    Take 81 mg by mouth daily.   DONEPEZIL (ARICEPT) 5 MG TABLET    Take 5 mg by mouth at bedtime.   FISH OIL-KRILL OIL (KRILL OIL PLUS PO)    Take 1 capsule by mouth daily.   FUROSEMIDE (LASIX) 40 MG TABLET    Take 1 tablet (40 mg total) by mouth daily. X 5 days   GLUCOSAMINE-CHONDROITIN 500-400 MG TABLET    Take 1 tablet by mouth daily.   LEVOFLOXACIN (LEVAQUIN) 750 MG TABLET    Take 1 tablet (750 mg total) by mouth daily.   METOPROLOL TARTRATE (LOPRESSOR) 25 MG TABLET    Take 0.5  tablets (12.5 mg total) by mouth 2 (two) times daily.   MULTIPLE VITAMIN (MULTIVITAMIN WITH MINERALS) TABS TABLET    Take 1 tablet by mouth 2 (two) times daily.   POTASSIUM CHLORIDE SA (K-DUR,KLOR-CON) 20 MEQ TABLET    Take 1 tablet (20 mEq total) by mouth daily. X 5 days   PRAVASTATIN (PRAVACHOL) 40 MG TABLET    Take 40 mg by mouth every evening.   PROBIOTIC PRODUCT (PROBIOTIC PO)    Take 1 tablet by mouth daily.   TRAMADOL (ULTRAM) 50 MG TABLET    Take 1 tablet (50 mg total) by mouth every 4 (four) hours as needed for moderate pain.    TURMERIC 500 MG CAPS    Take 500 mg by mouth daily.  Modified Medications   No medications on file  Discontinued Medications   No medications on file    Physical Exam: Filed Vitals:   08/22/15 1034  BP: 116/58  Pulse: 71  Temp: 98.9 F (37.2 C)  TempSrc: Tympanic  Resp: 19   There is no weight on file to calculate BMI.  Physical Exam  Constitutional: He is oriented to person, place, and time. He appears well-developed and well-nourished. No distress.  HENT:  Head: Normocephalic and atraumatic.  Right Ear: External ear normal.  Left Ear: External ear normal.  Nose: Nose normal.  Eyes: Conjunctivae and EOM are normal. Pupils are equal, round, and reactive to light. Right eye exhibits no discharge. Left eye exhibits no discharge.  Neck: Normal range of motion. Neck supple. No JVD present. No tracheal deviation present. No thyromegaly present.  Cardiovascular: Normal rate.  Exam reveals no friction rub.   No murmur heard. Pulmonary/Chest: Effort normal and breath sounds normal. No respiratory distress. He has no wheezes. He has no rales.  Abdominal: Soft. Bowel sounds are normal. He exhibits no distension and no mass. There is no tenderness.  Musculoskeletal: Normal range of motion. He exhibits no edema or tenderness.  Lymphadenopathy:    He has no cervical adenopathy.  Neurological: He is alert and oriented to person, place, and time. He has normal reflexes.  Skin: Skin is warm and dry. No rash noted. He is not diaphoretic. No erythema. No pallor.  Mid sternal surgical incision is healed.   Psychiatric: He has a normal mood and affect. His behavior is normal. Judgment and thought content normal.    Labs reviewed: Basic Metabolic Panel:  Recent Labs  08/07/15 2254 08/08/15 0315  08/08/15 1712  08/10/15 0603 08/12/15 0210 08/14/15 0549 08/21/15  NA  --  135  < >  --   < > 135 136 136 137  K  --  4.3  < >  --   < > 3.9 4.0 3.9 4.8  CL  --  108  < >  --   < > 103 105  103  --   CO2  --  23  --   --   < > 27 24 26   --   GLUCOSE  --  124*  < >  --   < > 115* 113* 107*  --   BUN  --  13  < >  --   < > 26* 24* 14 15  CREATININE 0.98 0.94  < > 1.17  < > 0.96 0.96 1.20 1.0  CALCIUM  --  8.1*  --   --   < > 8.5* 8.4* 8.8*  --   MG 2.9* 2.7*  --  2.6*  --   --   --   --   --   < > =  values in this interval not displayed.  Liver Function Tests:  Recent Labs  08/12/15 0210 08/21/15  AST 66* 23  ALT 55 38  ALKPHOS 104 101  BILITOT 0.7  --   PROT 4.8*  --   ALBUMIN 2.4*  --     CBC:  Recent Labs  08/10/15 0603 08/12/15 0210 08/14/15 0549 08/21/15  WBC 10.2 9.9 9.6 13.7  NEUTROABS  --  6.8  --   --   HGB 11.2* 10.4* 10.5* 12.1*  HCT 33.7* 30.8* 30.8* 35*  MCV 91.6 90.9 91.4  --   PLT 136* 195 272 596*    Lab Results  Component Value Date   TSH 7.23* 08/21/2015   Lab Results  Component Value Date   HGBA1C 5.7* 08/06/2015   Lab Results  Component Value Date   CHOL 133 08/02/2015   HDL 42 08/02/2015   LDLCALC 78 08/02/2015   TRIG 64 08/02/2015   CHOLHDL 3.2 08/02/2015    Significant Diagnostic Results since last visit: elevated TSH  Patient Care Team: Lona Kettle, MD as PCP - General (Family Medicine)  Assessment/Plan Problem List Items Addressed This Visit    Senile dementia    Continue Donepezil 5mg , update MMSE      Hypothyroidism - Primary    08/21/15 TSH 7.232 08/22/15 Levothyroxine 81mcg, TSH 8 weeks.       Essential hypertension    Controlled, continue Metoprolol 12.5mg  bid.           Family/ staff Communication: observe the patient.   Labs/tests ordered: TSH  ManXie Mast NP [facility] [address], [place] [address] On Call:  [phone] & follow prompts after 5pm & weekends Office Phone:  [phone] Office Fax:  [phone]

## 2015-08-22 NOTE — Assessment & Plan Note (Signed)
08/21/15 TSH 7.232 08/22/15 Levothyroxine 4mcg, TSH 8 weeks.

## 2015-08-22 NOTE — Assessment & Plan Note (Signed)
Controlled, continue Metoprolol 12.5mg bid.  

## 2015-08-29 NOTE — Progress Notes (Signed)
Cardiology Office Note   Date:  08/30/2015   ID:  Isaac [name], DOB 27-Feb-1926, MRN [id number]  PCP:   Melinda Crutch, MD  Cardiologist:  Dr. Candee Furbish   Electrophysiologist:  n/a  Chief Complaint  Patient presents with  . Hospitalization Follow-up    s/p CABG; readmit with pneumonia  . Coronary Artery Disease  . Blood Pressure Check    STATES BP FLUCTUATES UP AND DOWN     History of Present Illness: Isaac [name] is a 81 y.o. male with a hx of CAD s/p MI in 2005 tx with PCI in [place], [place], HTN, HL.  Established with Dr. Candee Furbish in 8/16.    Admitted 9/7-9/17 with a NSTEMI.  He presented to the emergency room with neck and throat pain 1 hour after swimming. LHC demonstrated severe 2v CAD with hemodynamically significant oLAD by FFR and high grade oLCx stenosis.  He was seen by Dr. Servando Snare and underwent CABG x 3 with L-LAD, S-CFX/OM1 (bilateral LE vein harvesting).  Post op course was fairly uneventful.    Readmitted 9/18-9/20 with pneumonia versus post pericardiotomy syndrome (patient presented with cough, fever and left lower lobe opacity on chest x-ray; normal white count, normal lactic acid, but mildly elevated Pro-calcitonin). Blood cultures, sputum culture negative.  Returns for FU with his wife, [name].  He is currently staying at [facility] for rehab. He should be going home next week.  He denies chest discomfort.  He is doing PT and feels pretty well without significant dyspnea. He denies orthopnea, PND, edema.  Denies syncope. His appetite is good.  Denies any bleeding, fever or cough.  He has noted his BP is up to 150 at times and down to 100. Also, his diastolic has been 70 to 093.  He was previously on Lisinopril.    Studies/Reports Reviewed Today:  Recent DC summaries and SNF records reviewed today.  Intraoperative TEE 08/07/15 Mild LVH, EF 55-60%, normal wall motion, mild MR, no PFO  Carotid US 08/04/15 Bilateral ICA 1-39%  LHC 08/03/15 LAD:  Ostial 65% (FFR 0.75), proximal 30%, ostial D1 99% LCx: Ostial 95%, proximal 95% RCA: Mid-distal stent okay  EF 55-65%   Past Medical History  Diagnosis Date  . History of MI (myocardial infarction) 2005    treated in [place], [place]  . Sciatica   . Hypertension   . Coronary artery disease     a. s/p PCI in 2005 in [place] for MI; b. NSTEMI 9/16 >> LHC with 2 v CAD >> s/p CABG   . HLD (hyperlipidemia)   . Dementia without behavioral disturbance   . History of transesophageal echocardiography (TEE) for monitoring     a. Intra-Op TEE 9/16:  Mild LVH, EF 55-60%, normal wall motion, mild MR, no PFO  . Carotid stenosis     a. Carotid US 9/16:  Bilateral ICA 1-39%    Past Surgical History  Procedure Laterality Date  . Coronary stent placement    . Cholecystectomy    . Hernia repair    . Cardiac catheterization N/A 08/03/2015    Procedure: Left Heart Cath and Coronary Angiography;  Surgeon: Peter M Martinique, MD;  Location: [place] [facility];  Service: Cardiovascular;  Laterality: N/A;  . Cardiac catheterization  08/03/2015    Procedure: Intravascular Pressure Wire/FFR Study;  Surgeon: Peter M Martinique, MD;  Location: [place] [facility];  Service: Cardiovascular;;  . Coronary artery bypass graft N/A 08/07/2015    Procedure: CORONARY ARTERY  BYPASS GRAFTING (CABG) x 3 with Endoscopic Vein Harvesting of greater saphenous vein from bilateral thighs;  Surgeon: [name], MD;  Location: [place];  Service: Open Heart Surgery;  Laterality: N/A;  . Tee without cardioversion N/A 08/07/2015    Procedure: TRANSESOPHAGEAL ECHOCARDIOGRAM (TEE);  Surgeon: [name], MD;  Location: [place];  Service: Open Heart Surgery;  Laterality: N/A;     Current Outpatient Prescriptions  Medication Sig Dispense Refill  . aspirin EC 81 MG tablet Take 81 mg by mouth daily.    Marland Kitchen donepezil (ARICEPT) 5 MG tablet Take 5 mg by mouth at bedtime.    . Fish Oil-Krill Oil (KRILL OIL PLUS PO) Take 500 mg by mouth daily.      Marland Kitchen glucosamine-chondroitin 500-400 MG tablet Take 1 tablet by mouth daily.    Marland Kitchen levothyroxine (SYNTHROID, LEVOTHROID) 50 MCG tablet Take 50 mcg by mouth daily before breakfast.    . metoprolol tartrate (LOPRESSOR) 25 MG tablet Take 0.5 tablets (12.5 mg total) by mouth 2 (two) times daily.    . Multiple Vitamin (MULTIVITAMIN WITH MINERALS) TABS tablet Take 1 tablet by mouth 2 (two) times daily.    . pravastatin (PRAVACHOL) 40 MG tablet Take 40 mg by mouth every evening.    . Probiotic Product (PROBIOTIC PO) Take 250 mg by mouth daily.    . traMADol (ULTRAM) 50 MG tablet Take 50 mg by mouth every 6 (six) hours as needed for moderate pain.    . Turmeric 500 MG CAPS Take 500 mg by mouth daily.     No current facility-administered medications for this visit.    Allergies:   Review of patient's allergies indicates no known allergies.    Social History:  The patient  reports that he has never smoked. He has never used smokeless tobacco. He reports that he does not drink alcohol or use illicit drugs.   Family History:  The patient's family history includes Heart attack in his father.    ROS:   Please see the history of present illness.   Review of Systems  All other systems reviewed and are negative.     PHYSICAL EXAM: VS:  BP 100/60 mmHg  Pulse 71  Ht 5' 9.5" (1.765 m)  Wt 152 lb 1.9 oz (69.001 kg)  BMI 22.15 kg/m2    Wt Readings from Last 3 Encounters:  08/30/15 152 lb 1.9 oz (69.001 kg)  08/17/15 162 lb (73.483 kg)  08/12/15 164 lb 12.8 oz (74.753 kg)     GEN: Well nourished, well developed, in no acute distress HEENT: normal Neck: no JVD,   no masses Cardiac:  Normal S1/S2, RRR; no murmur ,  no rubs or gallops, no edema   Chest:  Median sternotomy well healed without erythema or discharge. Respiratory:  clear to auscultation bilaterally, no wheezing, rhonchi or rales. GI: soft, nontender, nondistended, + BS MS: no deformity or atrophy Skin: warm and dry  Neuro:  CNs  II-XII intact, Strength and sensation are intact Psych: Normal affect   EKG:  EKG is ordered today.  It demonstrates:   NSR, HR 71, normal axis, inf-lat TWI, similar to ECG 08/14/15   Recent Labs: 08/08/2015: Magnesium 2.6* 08/21/2015: ALT 38; BUN 15; Creatinine 1.0; Hemoglobin 12.1*; Platelets 596*; Potassium 4.8; Sodium 137; TSH 7.23*    Lipid Panel    Component Value Date/Time   CHOL 133 08/02/2015 0551   TRIG 64 08/02/2015 0551   HDL 42 08/02/2015 0551   CHOLHDL 3.2  08/02/2015 0551   VLDL 13 08/02/2015 0551   LDLCALC 78 08/02/2015 0551      ASSESSMENT AND PLAN:  1. CAD:  Hx of MI in 2005 tx with PCI.  Recent admit to [facility] with NSTEMI and subsequent CABG.  Unfortunately he was readmitted with pneumonia vs post-pericardiotomy syndrome.  Since DC, he has been doing well.  He remains at [facility] but goes home next week.  Continue ASA, statin, beta-blocker. I have encouraged him to start cardiac rehab once he has completed rehab.    2. HTN:  BP somewhat low but he is asymptomatic.  It has been high at times. I have asked him to monitor it and let us know if his BP is high. At some point, we may be able to resume his Lisinopril.   3. Hyperlipidemia:  Continue statin.   4. Carotid Stenosis:  Pre-CABG dopplers with minimal plaque.  Consider repeat US in 2 years.       Medication Changes: Current medicines are reviewed at length with the patient today.  Concerns regarding medicines are as outlined above.  The following changes have been made:   Discontinued Medications   FUROSEMIDE (LASIX) 40 MG TABLET    Take 1 tablet (40 mg total) by mouth daily. X 5 days   LEVOFLOXACIN (LEVAQUIN) 750 MG TABLET    Take 1 tablet (750 mg total) by mouth daily.   POTASSIUM CHLORIDE SA (K-DUR,KLOR-CON) 20 MEQ TABLET    Take 1 tablet (20 mEq total) by mouth daily. X 5 days   TRAMADOL (ULTRAM) 50 MG TABLET    Take 1 tablet (50 mg total) by mouth every 4 (four) hours as needed for moderate  pain.   Modified Medications   No medications on file   New Prescriptions   No medications on file   Labs/ tests ordered today include:   No orders of the defined types were placed in this encounter.     Disposition:    FU with Dr. Candee Furbish 6-8 weeks.    Signed, Versie Starks, MHS 08/30/2015 12:27 PM    [facility] [place], [place],   [address] Phone: [phone]; Fax: [phone]

## 2015-08-30 ENCOUNTER — Ambulatory Visit (INDEPENDENT_AMBULATORY_CARE_PROVIDER_SITE_OTHER): Payer: Medicare Other | Admitting: Physician Assistant

## 2015-08-30 ENCOUNTER — Encounter: Payer: Self-pay | Admitting: Physician Assistant

## 2015-08-30 VITALS — BP 100/60 | HR 71 | Ht 69.5 in | Wt 152.1 lb

## 2015-08-30 DIAGNOSIS — I6523 Occlusion and stenosis of bilateral carotid arteries: Secondary | ICD-10-CM

## 2015-08-30 DIAGNOSIS — I2510 Atherosclerotic heart disease of native coronary artery without angina pectoris: Secondary | ICD-10-CM

## 2015-08-30 DIAGNOSIS — Z951 Presence of aortocoronary bypass graft: Secondary | ICD-10-CM | POA: Diagnosis not present

## 2015-08-30 DIAGNOSIS — I10 Essential (primary) hypertension: Secondary | ICD-10-CM

## 2015-08-30 DIAGNOSIS — E785 Hyperlipidemia, unspecified: Secondary | ICD-10-CM

## 2015-08-30 NOTE — Patient Instructions (Signed)
Medication Instructions:  No changes  Labwork: None today   Testing/Procedures: None   Follow-Up: Dr. Candee Furbish in 6-8 weeks.  Any Other Special Instructions Will Be Listed Below (If Applicable).  I will send in a referral to Cardiac Rehab at [facility]. After you see Dr. Servando Snare, you can call to arrange a visit (as long as you are not seeing physical therapy at home).  Keep an eye on your BP when you get home. You can check it once a day 3-4 times a week. Call if your BP is greater than 140/90.

## 2015-09-05 DIAGNOSIS — R41841 Cognitive communication deficit: Secondary | ICD-10-CM | POA: Diagnosis not present

## 2015-09-07 DIAGNOSIS — R41841 Cognitive communication deficit: Secondary | ICD-10-CM | POA: Diagnosis not present

## 2015-09-13 ENCOUNTER — Ambulatory Visit: Payer: Medicare Other | Admitting: Cardiothoracic Surgery

## 2015-09-13 DIAGNOSIS — R41841 Cognitive communication deficit: Secondary | ICD-10-CM | POA: Diagnosis not present

## 2015-09-14 ENCOUNTER — Other Ambulatory Visit: Payer: Self-pay | Admitting: Cardiothoracic Surgery

## 2015-09-14 DIAGNOSIS — Z951 Presence of aortocoronary bypass graft: Secondary | ICD-10-CM

## 2015-09-17 ENCOUNTER — Ambulatory Visit
Admission: RE | Admit: 2015-09-17 | Discharge: 2015-09-17 | Disposition: A | Discharge status: Home / Self Care | Payer: Medicare Other | Source: Ambulatory Visit | Attending: Cardiothoracic Surgery | Admitting: Cardiothoracic Surgery

## 2015-09-17 ENCOUNTER — Ambulatory Visit (INDEPENDENT_AMBULATORY_CARE_PROVIDER_SITE_OTHER): Payer: Self-pay | Admitting: Physician Assistant

## 2015-09-17 VITALS — BP 140/90 | HR 80 | Resp 20 | Ht 69.5 in | Wt 152.0 lb

## 2015-09-17 DIAGNOSIS — Z951 Presence of aortocoronary bypass graft: Secondary | ICD-10-CM

## 2015-09-17 DIAGNOSIS — J90 Pleural effusion, not elsewhere classified: Secondary | ICD-10-CM | POA: Diagnosis not present

## 2015-09-17 MED ORDER — LISINOPRIL 5 MG PO TABS: 5.0000 mg | ORAL_TABLET | Freq: Every day | ORAL | Status: DC

## 2015-09-17 NOTE — Patient Instructions (Signed)
Activity: 1.May walk up steps                2.No lifting more than ten pounds for three weeks.                 3.Stop any activity that causes chest pain, shortness of breath, dizziness,   sweating or excessive weakness.                4.Avoid straining.                 Wound Care: May shower.  Clean wounds with mild soap and water daily. Contact the office at [phone] if any problems arise.

## 2015-09-17 NOTE — Progress Notes (Addendum)
  HPI:  Patient returns for routine postoperative follow-up having undergone a CABGx 3 on 08/07/2015 by Dr. Servando Snare. Since hospital discharge the patient reports no complaints. His stamina continues to improve and he hopes to be able to swim soon.    Current Outpatient Prescriptions  Medication Sig Dispense Refill  . aspirin EC 81 MG tablet Take 81 mg by mouth daily.    Marland Kitchen donepezil (ARICEPT) 5 MG tablet Take 5 mg by mouth at bedtime.    . Fish Oil-Krill Oil (KRILL OIL PLUS PO) Take 500 mg by mouth daily.    Marland Kitchen glucosamine-chondroitin 500-400 MG tablet Take 1 tablet by mouth daily.    Marland Kitchen levothyroxine (SYNTHROID, LEVOTHROID) 50 MCG tablet Take 50 mcg by mouth daily before breakfast.    . metoprolol tartrate (LOPRESSOR) 25 MG tablet Take 0.5 tablets (12.5 mg total) by mouth 2 (two) times daily.    . Multiple Vitamin (MULTIVITAMIN WITH MINERALS) TABS tablet Take 1 tablet by mouth 2 (two) times daily.    . pravastatin (PRAVACHOL) 40 MG tablet Take 40 mg by mouth every evening.    . Probiotic Product (PROBIOTIC PO) Take 250 mg by mouth daily.    . traMADol (ULTRAM) 50 MG tablet Take 50 mg by mouth every 6 (six) hours as needed for moderate pain.    . Turmeric 500 MG CAPS Take 500 mg by mouth daily.    Marland Kitchen lisinopril (PRINIVIL,ZESTRIL) 5 MG tablet Take 1 tablet (5 mg total) by mouth daily. 30 tablet 1  Vital Signs: BP 140/90, HR 80, RR 20, oxygen saturation 95%  Physical Exam: CV-RRR Pulmonary-Clear Abdomen-Soft, non tender, bowel sounds present Extremities-No lower extremity edema Wounds-Clean and dry. No signs of infection  Diagnostic Tests: PA/LAT CXR:  EXAM: CHEST 2 VIEW  COMPARISON: 08/12/2015.  FINDINGS: Normal sized heart. Small left pleural effusion. Significantly decreased left basilar airspace opacity. Minimal right basilar atelectasis. Stable post CABG changes. Coronary artery stent. Thoracic spine degenerative changes.  IMPRESSION: 1. Small left pleural  effusion. 2. Significantly decreased left basilar atelectasis. 3. Minimal right basilar atelectasis.  Impression and Plan: Overall, Isaac [name] is recovering well from surgery. The only change made to his medications was the restarting of Lisinopril for better blood pressure control. I gave him a prescription for Lisinopril 5 mg daily. He is not taking any narcotics for pain. He was instructed he may begin driving 30 minutes of less during the day and increase his frequency and duration as tolerates. He was instructed to continue with sternal precautions (i.e. No lifting more than 10 pounds) for the next 3 weeks. He was instructed he may begin wading in the water, but no swimming until instructed otherwise. He inquired about taking Synthroid 50 mcg daily. I explained that a TSH was drawn after surgery and it was elevated (7.23). He needs to keep taking his Synthroid and I will contact his primary care physician, Dr. Melinda Crutch, about following TSH. Patient will return to see Dr. Servando Snare in 2-3 weeks.  Nani Skillern, PA-C [facility] [phone]

## 2015-09-19 ENCOUNTER — Other Ambulatory Visit: Payer: Self-pay | Admitting: *Deleted

## 2015-09-19 DIAGNOSIS — I10 Essential (primary) hypertension: Secondary | ICD-10-CM

## 2015-09-19 MED ORDER — LISINOPRIL 5 MG PO TABS: 5.0000 mg | ORAL_TABLET | Freq: Every day | ORAL | Status: AC

## 2015-09-21 DIAGNOSIS — R41841 Cognitive communication deficit: Secondary | ICD-10-CM | POA: Diagnosis not present

## 2015-09-25 ENCOUNTER — Telehealth (HOSPITAL_COMMUNITY): Payer: Self-pay | Admitting: *Deleted

## 2015-09-28 DIAGNOSIS — E039 Hypothyroidism, unspecified: Secondary | ICD-10-CM | POA: Diagnosis not present

## 2015-10-11 ENCOUNTER — Encounter: Payer: Medicare Other | Admitting: Cardiothoracic Surgery

## 2015-10-23 DIAGNOSIS — E0590 Thyrotoxicosis, unspecified without thyrotoxic crisis or storm: Secondary | ICD-10-CM | POA: Diagnosis not present

## 2015-10-25 ENCOUNTER — Encounter: Payer: Medicare Other | Admitting: Cardiothoracic Surgery

## 2015-10-26 ENCOUNTER — Ambulatory Visit (INDEPENDENT_AMBULATORY_CARE_PROVIDER_SITE_OTHER): Payer: Medicare Other | Admitting: Cardiology

## 2015-10-26 ENCOUNTER — Encounter: Payer: Self-pay | Admitting: Cardiology

## 2015-10-26 VITALS — BP 138/80 | HR 74 | Ht 69.0 in | Wt 155.0 lb

## 2015-10-26 DIAGNOSIS — I6523 Occlusion and stenosis of bilateral carotid arteries: Secondary | ICD-10-CM | POA: Diagnosis not present

## 2015-10-26 DIAGNOSIS — I2510 Atherosclerotic heart disease of native coronary artery without angina pectoris: Secondary | ICD-10-CM | POA: Diagnosis not present

## 2015-10-26 DIAGNOSIS — I252 Old myocardial infarction: Secondary | ICD-10-CM | POA: Diagnosis not present

## 2015-10-26 DIAGNOSIS — Z951 Presence of aortocoronary bypass graft: Secondary | ICD-10-CM

## 2015-10-26 DIAGNOSIS — E785 Hyperlipidemia, unspecified: Secondary | ICD-10-CM

## 2015-10-26 DIAGNOSIS — I2583 Coronary atherosclerosis due to lipid rich plaque: Secondary | ICD-10-CM

## 2015-10-26 NOTE — Patient Instructions (Signed)

## 2015-10-26 NOTE — Progress Notes (Signed)
Cardiology Office Note   Date:  10/26/2015   ID:  [name], DOB 01-24-1926, MRN [id number]  PCP:   [name] Crutch, MD  Cardiologist:  Dr. Candee [name]   Electrophysiologist:  n/a     History of Present Illness: [name] is a 81 y.o. male with a hx of CAD s/p MI in 2005 tx with PCI in [place], [place], HTN, HL.  Established in 8/16.    Admitted 9/7-9/17 with a NSTEMI.  He presented to the emergency room with neck and throat pain 1 hour after swimming. LHC demonstrated severe 2v CAD with hemodynamically significant oLAD by FFR and high grade oLCx stenosis.  He was seen by Dr. Servando [name] and underwent CABG x 3 with L-LAD, S-CFX/OM1 (bilateral LE vein harvesting).  Post op course was fairly uneventful.    Readmitted 9/18-9/20 with pneumonia versus post pericardiotomy syndrome (patient presented with cough, fever and left lower lobe opacity on chest x-ray; normal white count, normal lactic acid, but mildly elevated Pro-calcitonin). Blood cultures, sputum culture negative.  He is here for follow-up.  He finished rehab and is back home (he [facility] at [facility]).  He denies chest pain, dyspnea, DOE, orthopnea, PND, edema, lightheadedness, syncope, cough or fever.  His appetite is good and he feels he has picked some weight back up.  He has been walking about 1/2 mile daily and doing some backstroke in the pool.  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   He is a non-smoker and reports compliance with his prescribed medications.   Studies/Reports Reviewed Today:  Recent office visit records reviewed today.  Intraoperative TEE 08/07/15 Mild LVH, EF 55-60%, normal wall motion, mild MR, no PFO  Carotid US 08/04/15 Bilateral ICA 1-39%  LHC 08/03/15 LAD: Ostial 65% (FFR 0.75), proximal 30%, ostial D1 99% LCx: Ostial 95%, proximal 95% RCA: Mid-distal stent okay  EF 55-65%   Past Medical History  Diagnosis Date  .  History of MI (myocardial infarction) 2005    treated in [place], [place]  . Sciatica   . Hypertension   . Coronary artery disease     a. s/p PCI in 2005 in [place] for MI; b. NSTEMI 9/16 >> LHC with 2 v CAD >> s/p CABG   . HLD (hyperlipidemia)   . Dementia without behavioral disturbance   . History of transesophageal echocardiography (TEE) for monitoring     a. Intra-Op TEE 9/16:  Mild LVH, EF 55-60%, normal wall motion, mild MR, no PFO  . Carotid stenosis     a. Carotid US 9/16:  Bilateral ICA 1-39%    Past Surgical History  Procedure Laterality Date  . Coronary stent placement    . Cholecystectomy    . Hernia repair    . Cardiac catheterization N/A 08/03/2015    Procedure: Left Heart Cath and Coronary Angiography;  Surgeon: [name] M Martinique, MD;  Location: [place] [facility];  Service: Cardiovascular;  Laterality: N/A;  . Cardiac catheterization  08/03/2015    Procedure: Intravascular Pressure Wire/FFR Study;  Surgeon: [name] M Martinique, MD;  Location: [place] [facility];  Service: Cardiovascular;;  . Coronary artery bypass graft N/A 08/07/2015    Procedure: CORONARY ARTERY BYPASS GRAFTING (CABG) x 3 with Endoscopic Vein Harvesting of greater saphenous vein from bilateral thighs;  Surgeon: [name] Isaac, MD;  Location: [place];  Service: Open Heart Surgery;  Laterality: N/A;  . Tee without cardioversion  N/A 08/07/2015    Procedure: TRANSESOPHAGEAL ECHOCARDIOGRAM (TEE);  Surgeon: [name] Isaac, MD;  Location: [place];  Service: Open Heart Surgery;  Laterality: N/A;     Current Outpatient Prescriptions  Medication Sig Dispense Refill  . aspirin EC 81 MG tablet Take 81 mg by mouth daily.    Marland Kitchen donepezil (ARICEPT) 5 MG tablet Take 5 mg by mouth at bedtime.    . Fish Oil-Krill Oil (KRILL OIL PLUS PO) Take 500 mg by mouth daily.    Marland Kitchen glucosamine-chondroitin 500-400 MG tablet Take 1 tablet by mouth daily.    Marland Kitchen levothyroxine (SYNTHROID, LEVOTHROID) 50 MCG tablet Take 50 mcg by mouth daily before  breakfast.    . lisinopril (PRINIVIL,ZESTRIL) 5 MG tablet Take 1 tablet (5 mg total) by mouth daily. 30 tablet 1  . metoprolol tartrate (LOPRESSOR) 25 MG tablet Take 0.5 tablets (12.5 mg total) by mouth 2 (two) times daily.    . Multiple Vitamin (MULTIVITAMIN WITH MINERALS) TABS tablet Take 1 tablet by mouth 2 (two) times daily.    . pravastatin (PRAVACHOL) 40 MG tablet Take 40 mg by mouth every evening.    . Probiotic Product (PROBIOTIC PO) Take 250 mg by mouth daily.    . traMADol (ULTRAM) 50 MG tablet Take 50 mg by mouth every 6 (six) hours as needed for moderate pain.    . Turmeric 500 MG CAPS Take 500 mg by mouth daily.     No current facility-administered medications for this visit.    Allergies:   Review of patient's allergies indicates no known allergies.    Social History:  The patient  reports that he has never smoked. He has never used smokeless tobacco. He reports that he does not drink alcohol or use illicit drugs. Remote 2 year smoking years ago while in college.  Family History:  The patient's family history includes Heart attack in his father.    ROS:   Please see the history of present illness.   Review of Systems  All other systems reviewed and are negative.     PHYSICAL EXAM: VS:  BP 138/80 mmHg  Pulse 74  Ht 5\' 9"  (1.753 m)  Wt 155 lb (70.308 kg)  BMI 22.88 kg/m2  SpO2 96%    Wt Readings from Last 3 Encounters:  09/17/15 152 lb (68.947 kg)  08/30/15 152 lb 1.9 oz (69.001 kg)  08/17/15 162 lb (73.483 kg)     GEN: Well nourished, well developed, in no acute distress, looks younger than stated age. HEENT: normal Neck: no JVD,   no masses Cardiac:  Normal S1/S2, RRR; no murmurs/rubs/gallops, no edema, no carotid bruits Chest:  Median sternotomy well healed without erythema or discharge. Respiratory:  clear to auscultation bilaterally, no wheezing, rhonchi or rales. GI: soft, nontender, nondistended, + BS MS: no deformity or atrophy Skin: warm and dry    Neuro:  Grossly normal Psych: Normal mood and affect   EKG:  Not ordered today.   Recent Labs: 08/08/2015: Magnesium 2.6* 08/21/2015: ALT 38; BUN 15; Creatinine 1.0; Hemoglobin 12.1*; Platelets 596*; Potassium 4.8; Sodium 137; TSH 7.23*    Lipid Panel    Component Value Date/Time   CHOL 133 08/02/2015 0551   TRIG 64 08/02/2015 0551   HDL 42 08/02/2015 0551   CHOLHDL 3.2 08/02/2015 0551   VLDL 13 08/02/2015 0551   LDLCALC 78 08/02/2015 0551      ASSESSMENT AND PLAN:  1. CAD:  Hx of MI in 2005 tx with PCI  and NSTEMI followed by 3-vessel CABG in Sept 2016 readmitted shortly after with pneumonia vs post-pericardiotomy syndrome.  He completed rehab at [facility] and has now started walking 1/2 mile per day.  He is doing very well and has had no recurrence of symptoms.  Continue ASA, statin, beta-blocker.  He will follow-up with Dr. Servando [name] on 11/01/15.  Ok to start cardiac rehab from our standpoint. Dr. Servando [name] will make final decision next week.   2. HTN:  Well controlled on lisinopril 5mg  daily and metoprolol 12.5mg  BID  3. Hyperlipidemia:  Continue statin.   4. Carotid Stenosis:  Pre-CABG dopplers with minimal plaque.  Asymptomatic, no bruits.  Consider repeat US in 2 years.    Medication Changes: Current medicines are reviewed at length with the patient today.  Concerns regarding medicines are as outlined above.  The following changes have been made:  none Discontinued Medications   No medications on file   Modified Medications   No medications on file   New Prescriptions   No medications on file   Labs/ tests ordered today include:   No orders of the defined types were placed in this encounter.     Disposition:    FU with Dr. Candee [name] 6 months.    Bobby Rumpf, MD   10/26/2015 2:01 PM    [facility] [place], [place], [place]  [address] Phone: [phone]; Fax: [phone]

## 2015-11-01 ENCOUNTER — Ambulatory Visit (INDEPENDENT_AMBULATORY_CARE_PROVIDER_SITE_OTHER): Payer: Self-pay | Admitting: Cardiothoracic Surgery

## 2015-11-01 ENCOUNTER — Encounter: Payer: Self-pay | Admitting: Cardiothoracic Surgery

## 2015-11-01 VITALS — BP 162/86 | HR 73 | Resp 16 | Ht 69.0 in | Wt 155.0 lb

## 2015-11-01 DIAGNOSIS — Z951 Presence of aortocoronary bypass graft: Secondary | ICD-10-CM

## 2015-11-01 NOTE — Progress Notes (Signed)
[address]       [place] [address]             [phone]      Jigar Liddy Belford Medical Record [id number] Date of Birth: [birth year]  Referring: Jerline Pain, MD Primary Care:  Melinda Crutch, MD  Chief Complaint:   POST OP FOLLOW UP 08/07/2015  OPERATIVE REPORT PREOPERATIVE DIAGNOSES: New onset of unstable angina with non-ST segment elevation myocardial infarction. POSTOPERATIVE DIAGNOSES: New onset of unstable angina with non-ST segment elevation myocardial infarction. SURGICAL PROCEDURE: Coronary artery bypass grafting x3 with left internal mammary to the left anterior descending coronary artery and sequential reverse right greater saphenous vein to the obtuse marginal distal circumflex with endovein harvesting.  History of Present Illness:     Patient doing well postoperatively, now has returned to near normal activities. He is back walking in the pool.     Past Medical History  Diagnosis Date  . History of MI (myocardial infarction) 2005    treated in [place], [place]  . Sciatica   . Hypertension   . Coronary artery disease     a. s/p PCI in 2005 in [place] for MI; b. NSTEMI 9/16 >> LHC with 2 v CAD >> s/p CABG   . HLD (hyperlipidemia)   . Dementia without behavioral disturbance   . History of transesophageal echocardiography (TEE) for monitoring     a. Intra-Op TEE 9/16:  Mild LVH, EF 55-60%, normal wall motion, mild MR, no PFO  . Carotid stenosis     a. Carotid US 9/16:  Bilateral ICA 1-39%     History  Smoking status  . Never Smoker   Smokeless tobacco  . Never Used    History  Alcohol Use No     No Known Allergies  Current Outpatient Prescriptions  Medication Sig Dispense Refill  . aspirin EC 81 MG tablet Take 81 mg by mouth daily.    Marland Kitchen donepezil (ARICEPT) 5 MG tablet Take 5 mg by mouth at bedtime.    . Fish Oil-Krill Oil (KRILL OIL PLUS PO) Take 500 mg by mouth daily.    Marland Kitchen glucosamine-chondroitin 500-400 MG  tablet Take 1 tablet by mouth daily.    Marland Kitchen levothyroxine (SYNTHROID, LEVOTHROID) 50 MCG tablet Take 50 mcg by mouth daily before breakfast.    . lisinopril (PRINIVIL,ZESTRIL) 5 MG tablet Take 1 tablet (5 mg total) by mouth daily. 30 tablet 1  . metoprolol tartrate (LOPRESSOR) 25 MG tablet Take 0.5 tablets (12.5 mg total) by mouth 2 (two) times daily.    . Multiple Vitamin (MULTIVITAMIN WITH MINERALS) TABS tablet Take 1 tablet by mouth 2 (two) times daily.    . pravastatin (PRAVACHOL) 40 MG tablet Take 40 mg by mouth every evening.    . Probiotic Product (PROBIOTIC PO) Take 250 mg by mouth daily.    . traMADol (ULTRAM) 50 MG tablet Take 50 mg by mouth every 6 (six) hours as needed for moderate pain.    . Turmeric 500 MG CAPS Take 500 mg by mouth daily.     No current facility-administered medications for this visit.       Physical Exam: BP 162/86 mmHg  Pulse 73  Resp 16  Ht 5\' 9"  (1.753 m)  Wt 155 lb (70.308 kg)  BMI 22.88 kg/m2  SpO2 95%  General appearance: alert and cooperative Neurologic: intact Heart: regular rate and rhythm, S1, S2 normal, no murmur, click, rub or gallop Lungs:  clear to auscultation bilaterally Abdomen: soft, non-tender; bowel sounds normal; no masses,  no organomegaly Extremities: extremities normal, atraumatic, no cyanosis or edema and Homans sign is negative, no sign of DVT Wound: Sternum is stable and well healed   Diagnostic Studies & Laboratory data:     Recent Radiology Findings:   No results found.    Recent Lab Findings: Lab Results  Component Value Date   WBC 13.7 08/21/2015   HGB 12.1* 08/21/2015   HCT 35* 08/21/2015   PLT 596* 08/21/2015   GLUCOSE 107* 08/14/2015   CHOL 133 08/02/2015   TRIG 64 08/02/2015   HDL 42 08/02/2015   LDLCALC 78 08/02/2015   ALT 38 08/21/2015   AST 23 08/21/2015   NA 137 08/21/2015   K 4.8 08/21/2015   CL 103 08/14/2015   CREATININE 1.0 08/21/2015   BUN 15 08/21/2015   CO2 26 08/14/2015   TSH 7.23*  08/21/2015   INR 1.17 08/12/2015   HGBA1C 5.7* 08/06/2015      Assessment / Plan:     Patient doing well following coronary artery bypass grafting now returned to near-normal activities. He notes that he is remained active enough on his own now that he does not wish to enroll in outpatient cardiac rehabilitation. I'll plan to see him back as needed he is currently followed by cardiology.  [name] MD      [address] [place] [address] Office [phone]   Beeper [phone]  11/01/2015 2:56 PM

## 2015-11-06 ENCOUNTER — Telehealth (HOSPITAL_COMMUNITY): Payer: Self-pay | Admitting: *Deleted

## 2015-12-17 DIAGNOSIS — M5137 Other intervertebral disc degeneration, lumbosacral region: Secondary | ICD-10-CM | POA: Diagnosis not present

## 2015-12-17 DIAGNOSIS — M5116 Intervertebral disc disorders with radiculopathy, lumbar region: Secondary | ICD-10-CM | POA: Diagnosis not present

## 2015-12-17 DIAGNOSIS — M9903 Segmental and somatic dysfunction of lumbar region: Secondary | ICD-10-CM | POA: Diagnosis not present

## 2015-12-18 DIAGNOSIS — B001 Herpesviral vesicular dermatitis: Secondary | ICD-10-CM | POA: Diagnosis not present

## 2015-12-18 DIAGNOSIS — E039 Hypothyroidism, unspecified: Secondary | ICD-10-CM | POA: Diagnosis not present

## 2015-12-20 DIAGNOSIS — M5137 Other intervertebral disc degeneration, lumbosacral region: Secondary | ICD-10-CM | POA: Diagnosis not present

## 2015-12-20 DIAGNOSIS — M5116 Intervertebral disc disorders with radiculopathy, lumbar region: Secondary | ICD-10-CM | POA: Diagnosis not present

## 2015-12-20 DIAGNOSIS — M9903 Segmental and somatic dysfunction of lumbar region: Secondary | ICD-10-CM | POA: Diagnosis not present

## 2015-12-23 IMAGING — CR DG CHEST 1V PORT
1 series · 1 of 1 positions shown · non-contrast
Comparison: Portable chest x-ray of 08/08/2015

CLINICAL DATA: Post CABG

EXAM:
PORTABLE CHEST - 1 VIEW

[AP]
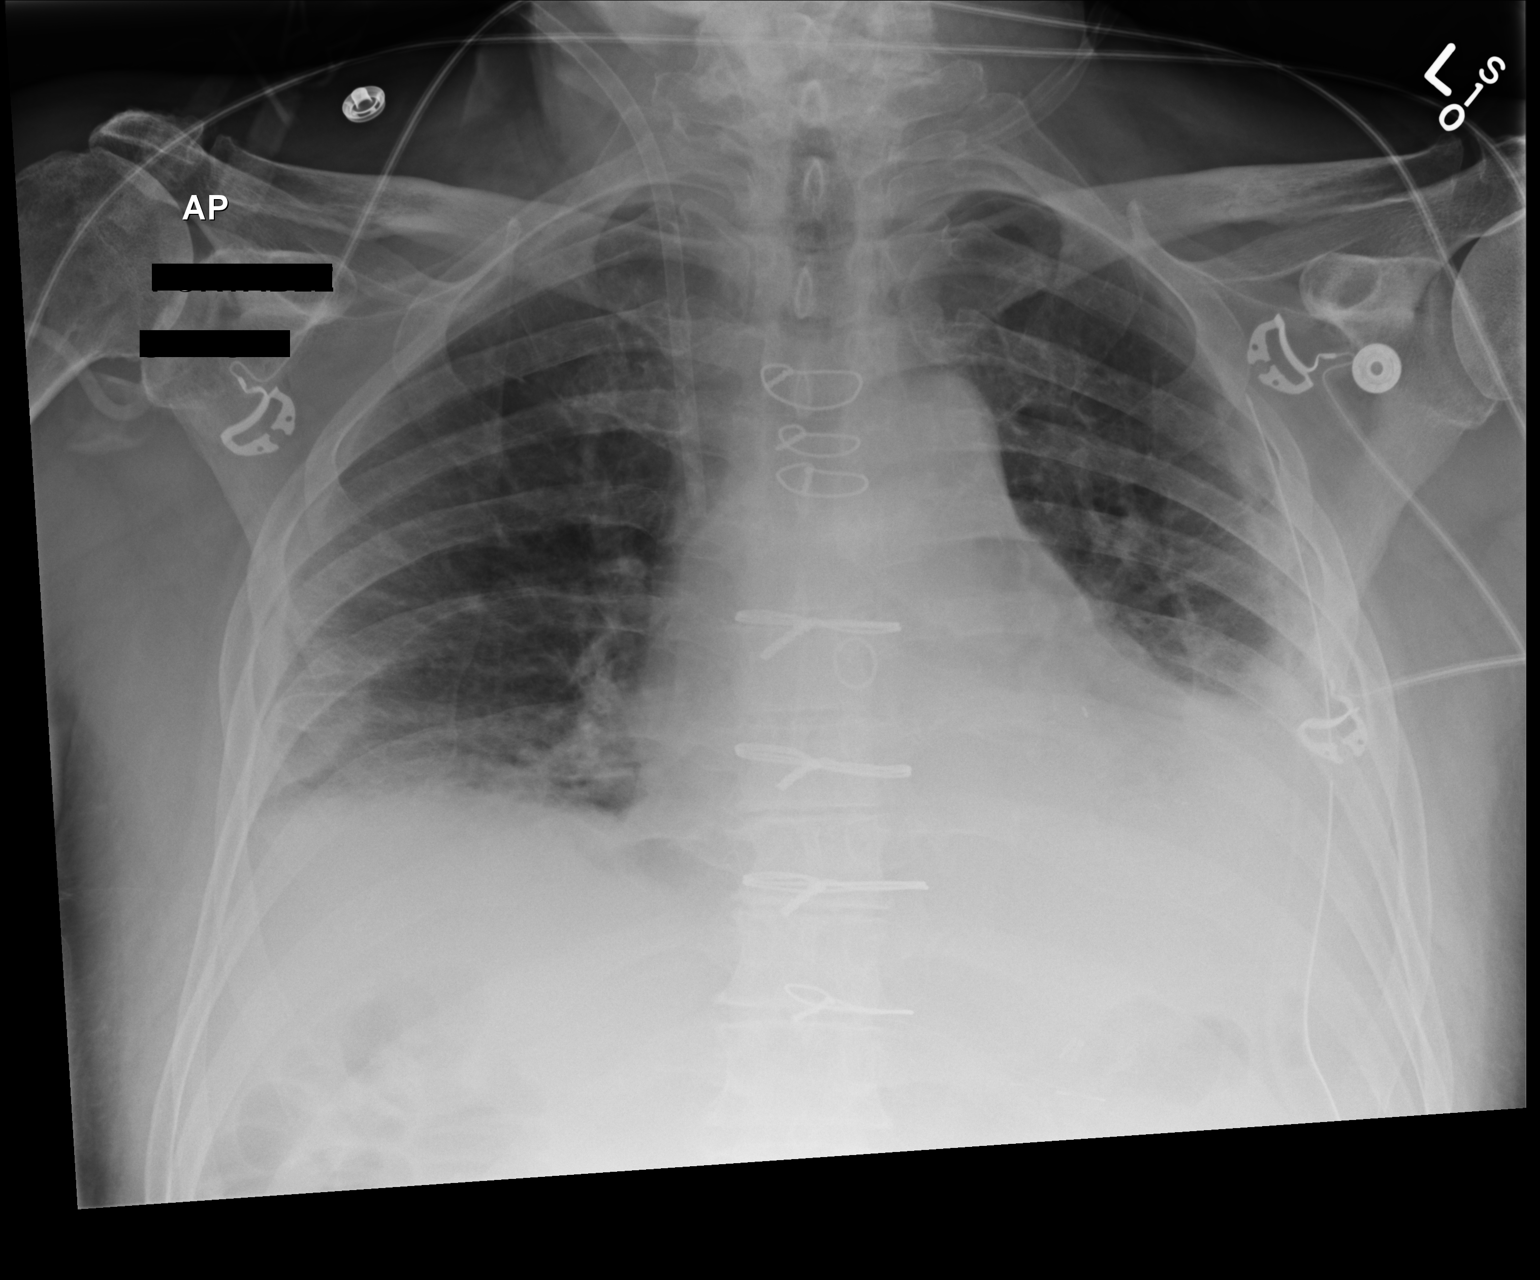

[1 of 1 positions shown; findings below may reference images not displayed]

FINDINGS: The Swan-Ganz catheter has been removed and a venous sheath remains
in the SVC. No pneumothorax is seen. Bibasilar opacities remain
left-greater-than-right most consistent with atelectasis, effusions,
but pneumonia cannot be excluded particularly at the left lung base.
A left chest tube remains and there is a tiny left apical
pneumothorax noted. Cardiomegaly is stable.
IMPRESSION: 1. Left chest tube remains with tiny left apical pneumothorax
present.
2. Swan-Ganz catheter removed.
3. Bibasilar opacities remain left-greater-than-right consistent
with atelectasis, effusions, but pneumonia cannot be excluded.

## 2015-12-25 DIAGNOSIS — M9903 Segmental and somatic dysfunction of lumbar region: Secondary | ICD-10-CM | POA: Diagnosis not present

## 2015-12-25 DIAGNOSIS — M5116 Intervertebral disc disorders with radiculopathy, lumbar region: Secondary | ICD-10-CM | POA: Diagnosis not present

## 2015-12-25 DIAGNOSIS — M5137 Other intervertebral disc degeneration, lumbosacral region: Secondary | ICD-10-CM | POA: Diagnosis not present

## 2016-01-26 DIAGNOSIS — J069 Acute upper respiratory infection, unspecified: Secondary | ICD-10-CM | POA: Diagnosis not present

## 2016-01-31 IMAGING — CR DG CHEST 2V
2 series · 2 of 2 positions shown · non-contrast
Comparison: 08/12/2015.

CLINICAL DATA: Status post CABG on 08/07/2015. No current chest
complaints.

EXAM:
CHEST  2 VIEW

[w chest pa]
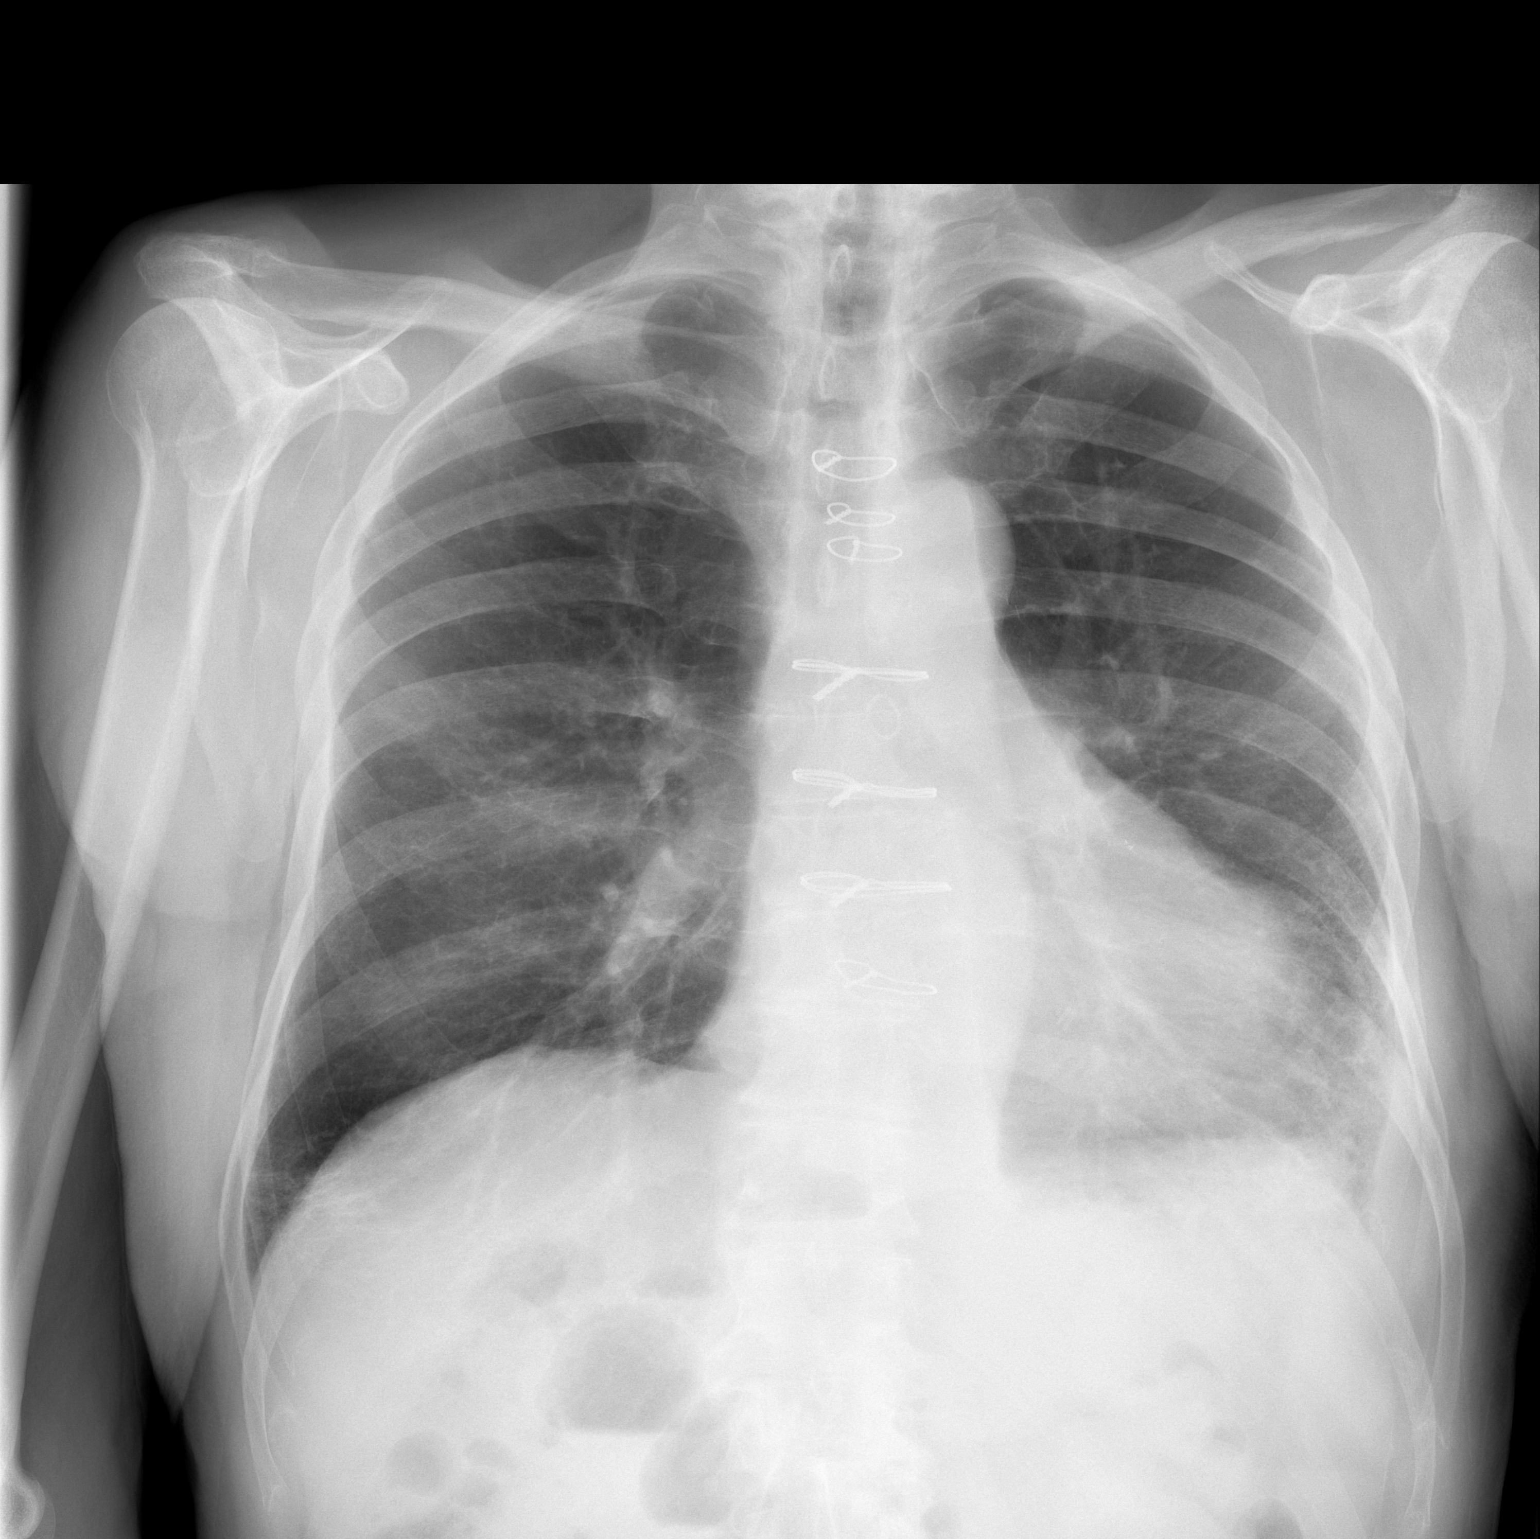

[w chest lat]
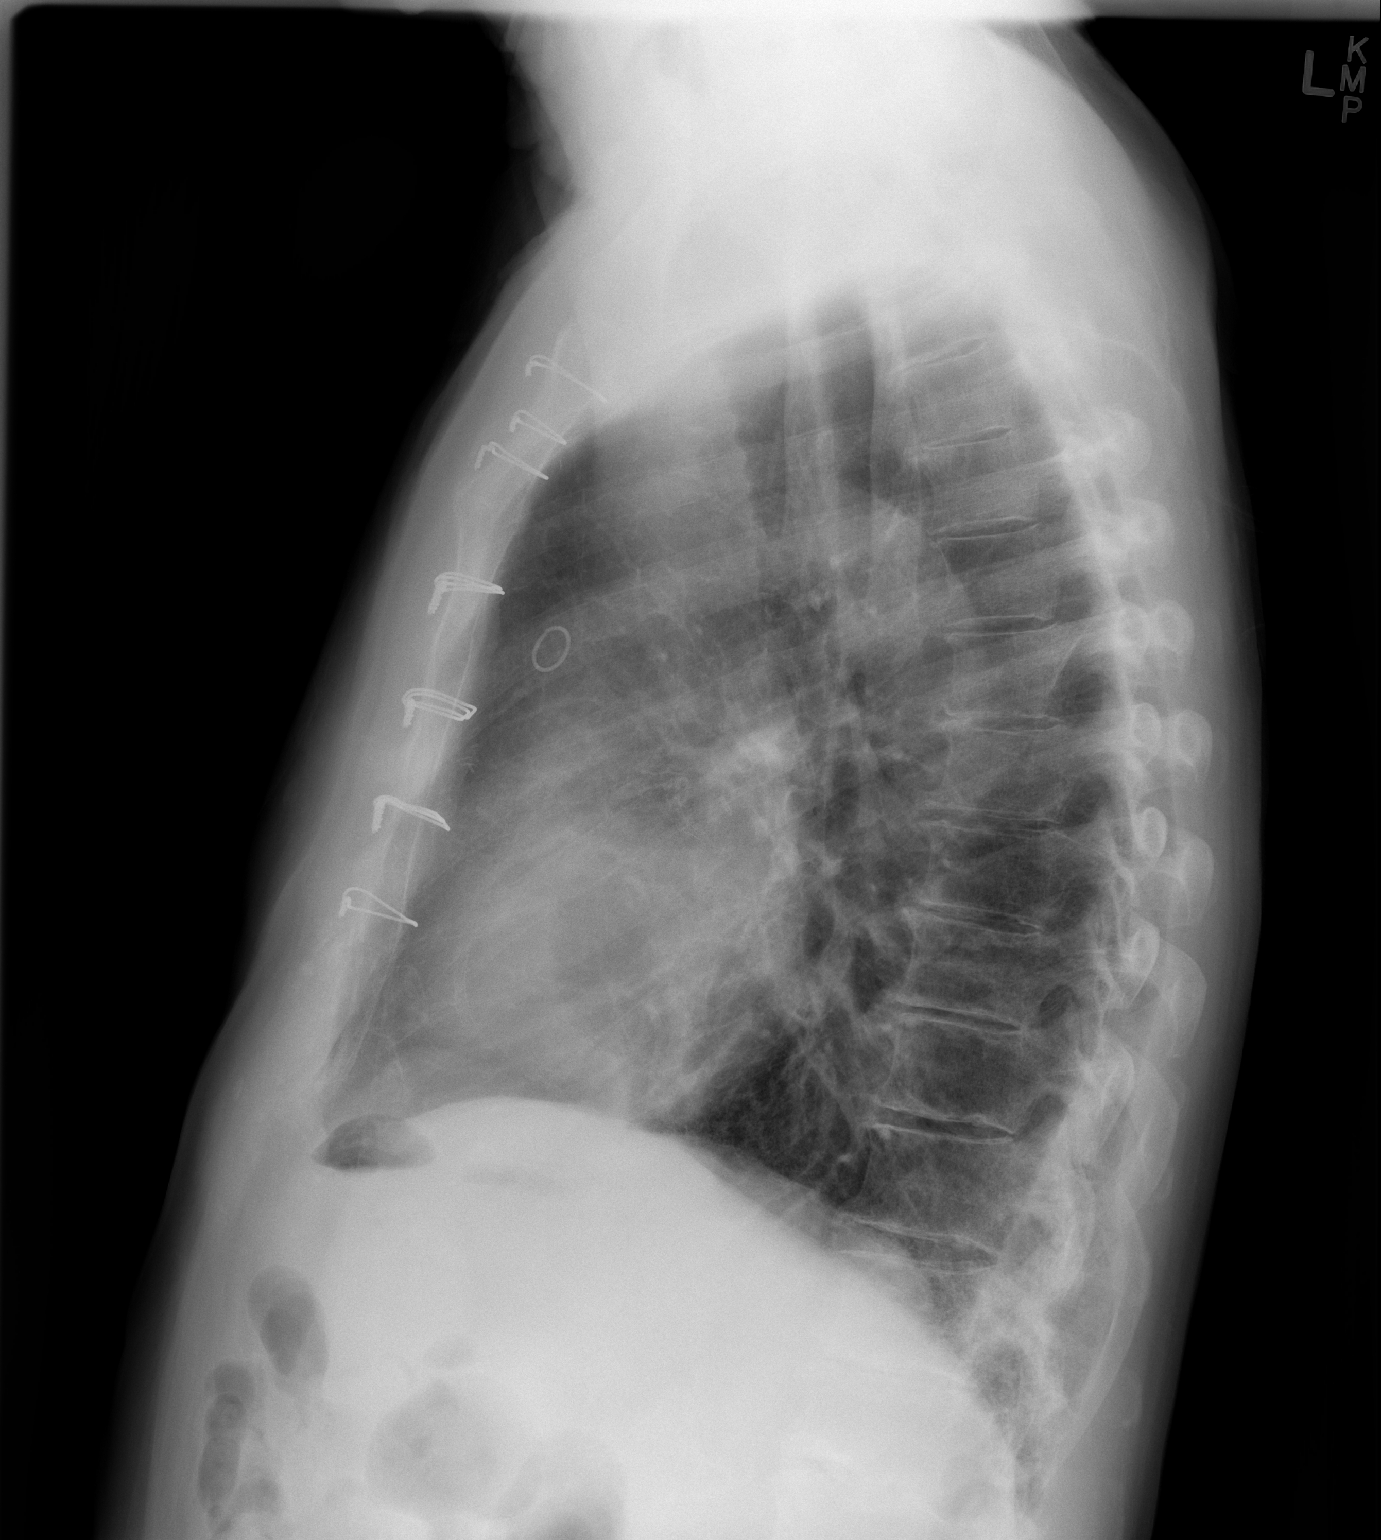

[2 of 2 positions shown; findings below may reference images not displayed]

FINDINGS: Normal sized heart. Small left pleural effusion. Significantly
decreased left basilar airspace opacity. Minimal right basilar
atelectasis. Stable post CABG changes. Coronary artery stent.
Thoracic spine degenerative changes.
IMPRESSION: 1. Small left pleural effusion.
2. Significantly decreased left basilar atelectasis.
3. Minimal right basilar atelectasis.

## 2016-04-08 DIAGNOSIS — B351 Tinea unguium: Secondary | ICD-10-CM | POA: Diagnosis not present

## 2016-04-08 DIAGNOSIS — B353 Tinea pedis: Secondary | ICD-10-CM | POA: Diagnosis not present

## 2016-04-24 ENCOUNTER — Encounter: Payer: Self-pay | Admitting: Cardiology

## 2016-04-24 ENCOUNTER — Ambulatory Visit (INDEPENDENT_AMBULATORY_CARE_PROVIDER_SITE_OTHER): Payer: Medicare Other | Admitting: Cardiology

## 2016-04-24 VITALS — BP 140/64 | HR 46 | Ht 69.0 in | Wt 160.0 lb

## 2016-04-24 DIAGNOSIS — R001 Bradycardia, unspecified: Secondary | ICD-10-CM | POA: Diagnosis not present

## 2016-04-24 DIAGNOSIS — I10 Essential (primary) hypertension: Secondary | ICD-10-CM | POA: Diagnosis not present

## 2016-04-24 DIAGNOSIS — Z951 Presence of aortocoronary bypass graft: Secondary | ICD-10-CM

## 2016-04-24 NOTE — Progress Notes (Signed)
Cardiology Office Note   Date:  04/24/2016   ID:  Isaac [name], DOB 05/23/1926, MRN [id number]  PCP:   Melinda Crutch, MD  Cardiologist:  Dr. Candee Furbish   Electrophysiologist:  n/a     History of Present Illness: Isaac [name] is a 82 y.o. male with a hx of CAD s/p MI in 2005 tx with PCI in [place], [place], HTN, HL.  Established in 8/16.    Admitted 9/7-9/17 with a NSTEMI.  He presented to the emergency room with neck and throat pain 1 hour after swimming. LHC demonstrated severe 2v CAD with hemodynamically significant oLAD by FFR and high grade oLCx stenosis.  He was seen by Dr. Servando Snare and underwent CABG x 3 with L-LAD, S-CFX/OM1 (bilateral LE vein harvesting).  Post op course was fairly uneventful.    Readmitted 9/18-9/20 with pneumonia versus post pericardiotomy syndrome (patient presented with cough, fever and left lower lobe opacity on chest x-ray; normal white count, normal lactic acid, but mildly elevated Pro-calcitonin). Blood cultures, sputum culture negative.  He is here for follow-up.  He finished rehab and is back home (he lives at [facility]).  He denies chest pain, dyspnea, DOE, orthopnea, PND, edema, lightheadedness, syncope, cough or fever.  His appetite is good and he feels he has picked some weight back up.  He has been walking about 1/2 mile daily and doing some backstroke in the pool.  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   He's a little bit more tired than usual. He is a non-smoker and reports compliance with his prescribed medications.  His heart rate usually is quite slow, I stopped his beta blocker   Studies/Reports Reviewed Today:  Recent office visit records reviewed today.  Intraoperative TEE 08/07/15 Mild LVH, EF 55-60%, normal wall motion, mild MR, no PFO  Carotid US 08/04/15 Bilateral ICA 1-39%  LHC 08/03/15 LAD: Ostial 65% (FFR 0.75), proximal 30%, ostial D1 99% LCx:  Ostial 95%, proximal 95% RCA: Mid-distal stent okay  EF 55-65%   Past Medical History  Diagnosis Date  . History of MI (myocardial infarction) 2005    treated in [place], [place]  . Sciatica   . Hypertension   . Coronary artery disease     a. s/p PCI in 2005 in [place] for [place]; b. NSTEMI 9/16 >> LHC with 2 v CAD >> s/p CABG   . HLD (hyperlipidemia)   . Dementia without behavioral disturbance   . History of transesophageal echocardiography (TEE) for monitoring     a. Intra-Op TEE 9/16:  Mild LVH, EF 55-60%, normal wall motion, mild MR, no PFO  . Carotid stenosis     a. Carotid US 9/16:  Bilateral ICA 1-39%    Past Surgical History  Procedure Laterality Date  . Coronary stent placement    . Cholecystectomy    . Hernia repair    . Cardiac catheterization N/A 08/03/2015    Procedure: Left Heart Cath and Coronary Angiography;  Surgeon: Peter M Martinique, MD;  Location: [facility];  Service: Cardiovascular;  Laterality: N/A;  . Cardiac catheterization  08/03/2015    Procedure: Intravascular Pressure Wire/FFR Study;  Surgeon: Peter M Martinique, MD;  Location: [facility];  Service: Cardiovascular;;  . Coronary artery bypass graft N/A 08/07/2015    Procedure: CORONARY ARTERY BYPASS GRAFTING (CABG) x 3 with Endoscopic Vein Harvesting of greater saphenous vein from bilateral thighs;  Surgeon:  [name], MD;  Location: [facility];  Service: Open Heart Surgery;  Laterality: N/A;  . Tee without cardioversion N/A 08/07/2015    Procedure: TRANSESOPHAGEAL ECHOCARDIOGRAM (TEE);  Surgeon: [name], MD;  Location: [facility];  Service: Open Heart Surgery;  Laterality: N/A;     Current Outpatient Prescriptions  Medication Sig Dispense Refill  . aspirin EC 81 MG tablet Take 81 mg by mouth daily.    Marland Kitchen donepezil (ARICEPT) 5 MG tablet Take 5 mg by mouth daily with supper.     . Fish Oil-Krill Oil (KRILL OIL PLUS PO) Take 500 mg by mouth daily.    Marland Kitchen glucosamine-chondroitin 500-400 MG tablet Take  1 tablet by mouth daily.    Marland Kitchen levothyroxine (SYNTHROID, LEVOTHROID) 50 MCG tablet Take 50 mcg by mouth daily before breakfast.    . lisinopril (PRINIVIL,ZESTRIL) 5 MG tablet Take 1 tablet (5 mg total) by mouth daily. 30 tablet 1  . Multiple Vitamin (MULTIVITAMIN WITH MINERALS) TABS tablet Take 1 tablet by mouth daily.     . pravastatin (PRAVACHOL) 40 MG tablet Take 40 mg by mouth every evening.    . Probiotic Product (PROBIOTIC PO) Take 250 mg by mouth daily.    . traMADol (ULTRAM) 50 MG tablet Take 50 mg by mouth every 6 (six) hours as needed for moderate pain.    . Turmeric 500 MG CAPS Take 500 mg by mouth daily.     No current facility-administered medications for this visit.    Allergies:   Review of patient's allergies indicates no known allergies.    Social History:  The patient  reports that he has never smoked. He has never used smokeless tobacco. He reports that he does not drink alcohol or use illicit drugs. Remote 2 year smoking years ago while in college.  Family History:  The patient's family history includes Heart attack in his father.    ROS:   Please see the history of present illness.   Review of Systems  All other systems reviewed and are negative.     PHYSICAL EXAM: VS:  BP 140/64 mmHg  Pulse 46  Ht 5\' 9"  (1.753 m)  Wt 160 lb (72.576 kg)  BMI 23.62 kg/m2    Wt Readings from Last 3 Encounters:  04/24/16 160 lb (72.576 kg)  11/01/15 155 lb (70.308 kg)  10/26/15 155 lb (70.308 kg)     GEN: Well nourished, well developed, in no acute distress, looks younger than stated age. HEENT: normal Neck: no JVD,   no masses Cardiac: Bradycardic S1/S2, occasional ectopy; no murmurs/rubs/gallops, no edema, no carotid bruits Chest:  Median sternotomy well healed without erythema or discharge. Respiratory:  clear to auscultation bilaterally, no wheezing, rhonchi or rales. GI: soft, nontender, nondistended, + BS MS: no deformity or atrophy Skin: warm and dry  Neuro:   Grossly normal Psych: Normal mood and affect   EKG:  EKG was ordered today. 04/24/16-first-degree AV block, sinus bradycardia rate 51, PVC.   Recent Labs: 08/08/2015: Magnesium 2.6* 08/21/2015: ALT 38; BUN 15; Creatinine 1.0; Hemoglobin 12.1*; Platelets 596*; Potassium 4.8; Sodium 137; TSH 7.23*    Lipid Panel    Component Value Date/Time   CHOL 133 08/02/2015 0551   TRIG 64 08/02/2015 0551   HDL 42 08/02/2015 0551   CHOLHDL 3.2 08/02/2015 0551   VLDL 13 08/02/2015 0551   LDLCALC 78 08/02/2015 0551      ASSESSMENT AND PLAN:  1. CAD:  Hx of MI in 2005 tx  with PCI and NSTEMI followed by 3-vessel CABG in Sept 2016 readmitted shortly after with pneumonia vs post-pericardiotomy syndrome.  He completed rehab at [facility] and has now started walking 1/2 mile per day.  He is doing very well and has had no recurrence of symptoms.  Continue ASA, statin, beta-blocker.  He will follow-up with Dr. Servando Snare on 11/01/15.  Ok to start cardiac rehab from our standpoint. Dr. Servando Snare will make final decision next week.   2. HTN:  Well controlled on lisinopril 5mg  daily and metoprolol 12.5mg  BID  3. Hyperlipidemia:  Continue statin.   4. Carotid Stenosis:  Pre-CABG dopplers with minimal plaque.  Asymptomatic, no bruits.  Consider repeat US in 2 years.   5. Sinus bradycardia-heart rate was 46, 51 on EKG. I'm going ahead and stopping his metoprolol. He also had a first-degree AV block as well.   Medication Changes: Current medicines are reviewed at length with the patient today.  Concerns regarding medicines are as outlined above.  The following changes have been made:  none Discontinued Medications   METOPROLOL TARTRATE (LOPRESSOR) 25 MG TABLET    Take 0.5 tablets (12.5 mg total) by mouth 2 (two) times daily.   Modified Medications   No medications on file   New Prescriptions   No medications on file   Labs/ tests ordered today include:   No orders of the defined types were placed in this  encounter.     Disposition:    FU with Dr. Candee Furbish 6 months.    Signed, Candee Furbish, MD   04/24/2016 4:04 PM    [facility] [place], [place], [place]  [address] Phone: [phone]; Fax: [phone]

## 2016-04-24 NOTE — Patient Instructions (Signed)
Medication Instructions:  Please stop your Metoprolol. Continue all other medications as listed.  Follow-Up: Follow up in 6 months with Dr. Skains.  You will receive a letter in the mail 2 months before you are due.  Please call us when you receive this letter to schedule your follow up appointment.  If you need a refill on your cardiac medications before your next appointment, please call your pharmacy.  Thank you for choosing [facility]!!      

## 2016-04-28 NOTE — Addendum Note (Signed)
Addended by: Freada Bergeron on: 04/28/2016 05:34 PM   Modules accepted: Orders

## 2016-05-01 ENCOUNTER — Telehealth: Payer: Self-pay | Admitting: *Deleted

## 2016-05-01 NOTE — Telephone Encounter (Signed)
Spoke with pt (who was transferred directly from billing).  He is reporting at his last office visit his metoprolol was stopped.  He stopped taking is for 1 day but became "scared" because his BP was elevated 183/87.  He restarted Metoprolol on his own at 12.5 mg in the AM and 6.25 mg in the PM.  BP now running 130-140/70-72 HR 50 to 56.  He denies any complaints.  Advised he should have called the office prior to restarting medication.  Instructed the reason his Metoprolol was d/ced was b/c of bradycardia and more than likely HTN would have been treated by adjusting a different medication.  Pt reports he feels fine and denies any complaints.  Instructed him I will forward this information to Dr Marlou Porch for his knowledge.

## 2016-05-02 MED ORDER — METOPROLOL TARTRATE 25 MG PO TABS: 12.5000 mg | ORAL_TABLET | ORAL | Status: DC

## 2016-05-02 NOTE — Telephone Encounter (Signed)
Thank you for the update. Heart rate seems relatively stable in the 50s. Blood pressure improved. Continue with current low-dose metoprolol.  Candee Furbish, MD

## 2016-05-02 NOTE — Telephone Encounter (Signed)
Pt aware to continue medications as listed. 

## 2016-05-20 DIAGNOSIS — B353 Tinea pedis: Secondary | ICD-10-CM | POA: Diagnosis not present

## 2016-05-20 DIAGNOSIS — B351 Tinea unguium: Secondary | ICD-10-CM | POA: Diagnosis not present

## 2016-07-15 DIAGNOSIS — H353131 Nonexudative age-related macular degeneration, bilateral, early dry stage: Secondary | ICD-10-CM | POA: Diagnosis not present

## 2016-07-15 DIAGNOSIS — Z0100 Encounter for examination of eyes and vision without abnormal findings: Secondary | ICD-10-CM | POA: Diagnosis not present

## 2016-08-06 DIAGNOSIS — Z23 Encounter for immunization: Secondary | ICD-10-CM | POA: Diagnosis not present

## 2016-10-20 DIAGNOSIS — Z0000 Encounter for general adult medical examination without abnormal findings: Secondary | ICD-10-CM | POA: Diagnosis not present

## 2016-10-20 DIAGNOSIS — E7800 Pure hypercholesterolemia, unspecified: Secondary | ICD-10-CM | POA: Diagnosis not present

## 2016-10-20 DIAGNOSIS — E039 Hypothyroidism, unspecified: Secondary | ICD-10-CM | POA: Diagnosis not present

## 2016-10-20 DIAGNOSIS — G309 Alzheimer's disease, unspecified: Secondary | ICD-10-CM | POA: Diagnosis not present

## 2016-10-20 DIAGNOSIS — I10 Essential (primary) hypertension: Secondary | ICD-10-CM | POA: Diagnosis not present

## 2016-10-21 ENCOUNTER — Ambulatory Visit: Payer: Medicare Other | Admitting: Cardiology

## 2016-10-21 DIAGNOSIS — I10 Essential (primary) hypertension: Secondary | ICD-10-CM | POA: Diagnosis not present

## 2016-10-21 DIAGNOSIS — E7800 Pure hypercholesterolemia, unspecified: Secondary | ICD-10-CM | POA: Diagnosis not present

## 2016-11-06 ENCOUNTER — Encounter: Payer: Self-pay | Admitting: Cardiology

## 2016-12-23 ENCOUNTER — Ambulatory Visit (INDEPENDENT_AMBULATORY_CARE_PROVIDER_SITE_OTHER): Payer: Medicare Other | Admitting: Cardiology

## 2016-12-23 ENCOUNTER — Encounter: Payer: Self-pay | Admitting: Cardiology

## 2016-12-23 VITALS — BP 134/82 | HR 78 | Ht 69.0 in | Wt 159.0 lb

## 2016-12-23 DIAGNOSIS — I10 Essential (primary) hypertension: Secondary | ICD-10-CM

## 2016-12-23 DIAGNOSIS — Z951 Presence of aortocoronary bypass graft: Secondary | ICD-10-CM

## 2016-12-23 DIAGNOSIS — R001 Bradycardia, unspecified: Secondary | ICD-10-CM | POA: Diagnosis not present

## 2016-12-23 DIAGNOSIS — I252 Old myocardial infarction: Secondary | ICD-10-CM | POA: Diagnosis not present

## 2016-12-23 NOTE — Patient Instructions (Signed)

## 2016-12-23 NOTE — Progress Notes (Signed)
Cardiology Office Note   Date:  12/23/2016   ID:  Isaac [name], DOB 02/23/1926, MRN [id number]  PCP:  Melinda Crutch, MD  Cardiologist:  Dr. Candee Furbish   Electrophysiologist:  n/a     History of Present Illness: Isaac [name] is a 83 y.o. male with a hx of CAD s/p MI in 2005 tx with PCI in [place], [place], HTN, HL.  Established here in 8/16.    Admitted 9/7-9/17/2016 with a NSTEMI.  He presented to the emergency room with neck and throat pain 1 hour after swimming. LHC demonstrated severe 2v CAD with hemodynamically significant oLAD by FFR and high grade oLCx stenosis.  He was seen by Dr. Servando Snare and underwent CABG x 3 with L-LAD, S-CFX/OM1 (bilateral LE vein harvesting).  Post op course was fairly uneventful.    Readmitted 9/18-9/20 with pneumonia versus post pericardiotomy syndrome (patient presented with cough, fever and left lower lobe opacity on chest x-ray; normal white count, normal lactic acid, but mildly elevated Pro-calcitonin). Blood cultures, sputum culture negative.  He is here for follow-up.  He finished rehab and is back home (he lives at [facility]).  He denies chest pain, dyspnea, DOE, orthopnea, PND, edema, lightheadedness, syncope, cough or fever. He has been walking about 1/2 mile daily and doing some backstroke in the pool.  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  No chest pain, no shortness of breath, no syncope.   Studies/Reports Reviewed Today:  Recent office visit records reviewed today.  Intraoperative TEE 08/07/15 Mild LVH, EF 55-60%, normal wall motion, mild MR, no PFO  Carotid US 08/04/15 Bilateral ICA 1-39%  LHC 08/03/15 LAD: Ostial 65% (FFR 0.75), proximal 30%, ostial D1 99% LCx: Ostial 95%, proximal 95% RCA: Mid-distal stent okay  EF 55-65%   Past Medical History:  Diagnosis Date  . Carotid  stenosis    a. Carotid US 9/16:  Bilateral ICA 1-39%  . Coronary artery disease    a. s/p PCI in 2005 in [place] for MI; b. NSTEMI 9/16 >> LHC with 2 v CAD >> s/p CABG   . Dementia without behavioral disturbance   . History of MI (myocardial infarction) 2005   treated in [place], [place]  . History of transesophageal echocardiography (TEE) for monitoring    a. Intra-Op TEE 9/16:  Mild LVH, EF 55-60%, normal wall motion, mild MR, no PFO  . HLD (hyperlipidemia)   . Hypertension   . Sciatica     Past Surgical History:  Procedure Laterality Date  . CARDIAC CATHETERIZATION N/A 08/03/2015   Procedure: Left Heart Cath and Coronary Angiography;  Surgeon: Peter M Martinique, MD;  Location: [facility];  Service: Cardiovascular;  Laterality: N/A;  . CARDIAC CATHETERIZATION  08/03/2015   Procedure: Intravascular Pressure Wire/FFR Study;  Surgeon: Peter M Martinique, MD;  Location: [facility];  Service: Cardiovascular;;  . CHOLECYSTECTOMY    . CORONARY ARTERY BYPASS GRAFT N/A 08/07/2015   Procedure: CORONARY ARTERY BYPASS GRAFTING (CABG) x 3 with Endoscopic Vein Harvesting of greater saphenous vein from bilateral thighs;  Surgeon: [name], MD;  Location: [facility];  Service: Open Heart Surgery;  Laterality: N/A;  . CORONARY STENT PLACEMENT    . HERNIA REPAIR    . TEE WITHOUT CARDIOVERSION N/A 08/07/2015   Procedure: TRANSESOPHAGEAL ECHOCARDIOGRAM (TEE);  Surgeon: [name], MD;  Location: [facility];  Service: Open Heart Surgery;  Laterality: N/A;     Current Outpatient Prescriptions  Medication Sig Dispense Refill  . aspirin EC 81 MG tablet Take 81 mg by mouth daily.    Marland Kitchen donepezil (ARICEPT) 5 MG tablet Take 5 mg by mouth daily with supper.     . Fish Oil-Krill Oil (KRILL OIL PLUS PO) Take 500 mg by mouth daily.    Marland Kitchen glucosamine-chondroitin 500-400 MG tablet Take 1 tablet by mouth daily.    Marland Kitchen levothyroxine (SYNTHROID, LEVOTHROID) 50 MCG tablet Take 50 mcg by mouth daily before  breakfast.    . lisinopril (PRINIVIL,ZESTRIL) 5 MG tablet Take 1 tablet (5 mg total) by mouth daily. 30 tablet 1  . metoprolol tartrate (LOPRESSOR) 25 MG tablet Take 0.5 tablets (12.5 mg total) by mouth as directed. Take 1/2 tablet every AM and 1/4 tablet every PM  3  . Multiple Vitamin (MULTIVITAMIN WITH MINERALS) TABS tablet Take 1 tablet by mouth daily.     . pravastatin (PRAVACHOL) 40 MG tablet Take 40 mg by mouth every evening.    . Probiotic Product (PROBIOTIC PO) Take 250 mg by mouth daily.    . traMADol (ULTRAM) 50 MG tablet Take 50 mg by mouth every 6 (six) hours as needed for moderate pain.    . Turmeric 500 MG CAPS Take 500 mg by mouth daily.     No current facility-administered medications for this visit.     Allergies:   Patient has no known allergies.    Social History:  The patient  reports that he has never smoked. He has never used smokeless tobacco. He reports that he does not drink alcohol or use drugs. Remote 2 year smoking years ago while in college.  Family History:  The patient's family history includes Heart attack in his father.    ROS:   Please see the history of present illness.   Review of Systems  All other systems reviewed and are negative.     PHYSICAL EXAM: VS:  BP 134/82   Pulse 78   Ht 5\' 9"  (1.753 m)   Wt 159 lb (72.1 kg)   BMI 23.48 kg/m     Wt Readings from Last 3 Encounters:  12/23/16 159 lb (72.1 kg)  04/24/16 160 lb (72.6 kg)  11/01/15 155 lb (70.3 kg)     GEN: Well nourished, well developed, in no acute distress, looks younger than stated age. HEENT: normal Neck: no JVD,   no masses Cardiac: Bradycardic S1/S2, occasional ectopy; no murmurs/rubs/gallops, no edema, no carotid bruits Chest:  Median sternotomy well healed. Respiratory:  clear to auscultation bilaterally, no wheezing, rhonchi or rales. GI: soft, nontender, nondistended, + BS MS: no deformity or atrophy Skin: warm and dry  Neuro:  Grossly normal Psych: Normal mood  and affect   EKG:  04/24/16-first-degree AV block, sinus bradycardia rate 51, PVC.   Recent Labs: No results found for requested labs within last 8760 hours.    Lipid Panel    Component Value Date/Time   CHOL 133 08/02/2015 0551   TRIG 64 08/02/2015 0551   HDL 42 08/02/2015 0551   CHOLHDL 3.2 08/02/2015 0551   VLDL 13 08/02/2015 0551   LDLCALC 78 08/02/2015 0551      ASSESSMENT AND PLAN:  1. CAD:  Hx of MI in 2005 tx with PCI and NSTEMI followed by 3-vessel CABG in Sept 2016 readmitted shortly after with pneumonia vs post-pericardiotomy syndrome.  He completed rehab at  [facility] and has now started walking 1/2 mile per day.  He is doing very well and has had no recurrence of symptoms.  Continue ASA, statin, beta-blocker.  He will follow-up with Dr. Servando Snare on 11/01/15.    2. HTN:  Well controlled on lisinopril 5mg  daily and metoprolol 12.5mg  BID  3. Hyperlipidemia:  Continue statin.   4. Carotid Stenosis:  Pre-CABG dopplers with minimal plaque.  Asymptomatic, no bruits.  Consider repeat US in 2 years.   5. Sinus bradycardia-heart rate was 46, 51 on EKG. He also had a first-degree AV block as well.Looks like he is still taking his metoprolol low-dose. He has not had any adverse effects from this. We will continue.      Disposition:    FU with Dr. Candee Furbish 12 months.    Signed, Candee Furbish, MD   12/23/2016 3:57 PM    [address], [place], [place]  [address] Phone: [phone]; Fax: [phone]

## 2017-03-09 DIAGNOSIS — G309 Alzheimer's disease, unspecified: Secondary | ICD-10-CM | POA: Diagnosis not present

## 2017-03-09 DIAGNOSIS — I10 Essential (primary) hypertension: Secondary | ICD-10-CM | POA: Diagnosis not present

## 2017-04-01 ENCOUNTER — Encounter (HOSPITAL_COMMUNITY): Payer: Self-pay

## 2017-04-01 ENCOUNTER — Emergency Department (HOSPITAL_COMMUNITY): Payer: Medicare Other

## 2017-04-01 ENCOUNTER — Inpatient Hospital Stay (HOSPITAL_COMMUNITY)
Admission: EM | Admit: 2017-04-01 | Discharge: 2017-04-03 | DRG: 287 | Disposition: A | Discharge status: Home Health | Payer: Medicare Other | Attending: Internal Medicine | Admitting: Internal Medicine

## 2017-04-01 DIAGNOSIS — Z951 Presence of aortocoronary bypass graft: Secondary | ICD-10-CM | POA: Diagnosis not present

## 2017-04-01 DIAGNOSIS — E785 Hyperlipidemia, unspecified: Secondary | ICD-10-CM | POA: Diagnosis present

## 2017-04-01 DIAGNOSIS — I2583 Coronary atherosclerosis due to lipid rich plaque: Secondary | ICD-10-CM | POA: Diagnosis not present

## 2017-04-01 DIAGNOSIS — R0602 Shortness of breath: Secondary | ICD-10-CM | POA: Diagnosis not present

## 2017-04-01 DIAGNOSIS — Z7982 Long term (current) use of aspirin: Secondary | ICD-10-CM

## 2017-04-01 DIAGNOSIS — I2510 Atherosclerotic heart disease of native coronary artery without angina pectoris: Secondary | ICD-10-CM | POA: Diagnosis present

## 2017-04-01 DIAGNOSIS — Z79899 Other long term (current) drug therapy: Secondary | ICD-10-CM

## 2017-04-01 DIAGNOSIS — R079 Chest pain, unspecified: Secondary | ICD-10-CM | POA: Diagnosis not present

## 2017-04-01 DIAGNOSIS — I200 Unstable angina: Secondary | ICD-10-CM

## 2017-04-01 DIAGNOSIS — I10 Essential (primary) hypertension: Secondary | ICD-10-CM | POA: Diagnosis present

## 2017-04-01 DIAGNOSIS — Z955 Presence of coronary angioplasty implant and graft: Secondary | ICD-10-CM

## 2017-04-01 DIAGNOSIS — F0390 Unspecified dementia without behavioral disturbance: Secondary | ICD-10-CM | POA: Diagnosis present

## 2017-04-01 DIAGNOSIS — I252 Old myocardial infarction: Secondary | ICD-10-CM

## 2017-04-01 DIAGNOSIS — I25110 Atherosclerotic heart disease of native coronary artery with unstable angina pectoris: Secondary | ICD-10-CM | POA: Diagnosis not present

## 2017-04-01 DIAGNOSIS — R072 Precordial pain: Secondary | ICD-10-CM | POA: Diagnosis not present

## 2017-04-01 DIAGNOSIS — F03A0 Unspecified dementia, mild, without behavioral disturbance, psychotic disturbance, mood disturbance, and anxiety: Secondary | ICD-10-CM

## 2017-04-01 DIAGNOSIS — R0789 Other chest pain: Secondary | ICD-10-CM | POA: Diagnosis not present

## 2017-04-01 DIAGNOSIS — Z8249 Family history of ischemic heart disease and other diseases of the circulatory system: Secondary | ICD-10-CM

## 2017-04-01 LAB — CBC
HEMATOCRIT: 39.9 % (ref 39.0–52.0)
HEMOGLOBIN: 13.6 g/dL (ref 13.0–17.0)
MCH: 31.1 pg (ref 26.0–34.0)
MCHC: 34.1 g/dL (ref 30.0–36.0)
MCV: 91.3 fL (ref 78.0–100.0)
Platelets: 190 10*3/uL (ref 150–400)
RBC: 4.37 MIL/uL (ref 4.22–5.81)
RDW: 13.7 % (ref 11.5–15.5)
WBC: 9.1 10*3/uL (ref 4.0–10.5)

## 2017-04-01 LAB — COMPREHENSIVE METABOLIC PANEL
ALBUMIN: 3.5 g/dL (ref 3.5–5.0)
ALK PHOS: 77 U/L (ref 38–126)
ALT: 20 U/L (ref 17–63)
AST: 19 U/L (ref 15–41)
Anion gap: 7 (ref 5–15)
BILIRUBIN TOTAL: 0.7 mg/dL (ref 0.3–1.2)
BUN: 19 mg/dL (ref 6–20)
CALCIUM: 9.4 mg/dL (ref 8.9–10.3)
CO2: 26 mmol/L (ref 22–32)
CREATININE: 1.03 mg/dL (ref 0.61–1.24)
Chloride: 104 mmol/L (ref 101–111)
GFR calc non Af Amer: >60 mL/min (ref >60)
GLUCOSE: 100 mg/dL — AB (ref 65–99)
Potassium: 4 mmol/L (ref 3.5–5.1)
SODIUM: 137 mmol/L (ref 135–145)
TOTAL PROTEIN: 6.2 g/dL — AB (ref 6.5–8.1)

## 2017-04-01 LAB — I-STAT TROPONIN, ED: Troponin i, poc: 0 ng/mL (ref 0.00–0.08)

## 2017-04-01 LAB — CBG MONITORING, ED: Glucose-Capillary: 94 mg/dL (ref 65–99)

## 2017-04-01 MED ORDER — HEPARIN (PORCINE) IN NACL 100-0.45 UNIT/ML-% IJ SOLN
1000.0000 [IU]/h | INTRAMUSCULAR | Status: DC
  Administered 2017-04-01: 850 [IU]/h via INTRAVENOUS
  Filled 2017-04-01: qty 250

## 2017-04-01 MED ORDER — ASPIRIN 81 MG PO CHEW: 324.0000 mg | CHEWABLE_TABLET | Freq: Once | ORAL | Status: DC

## 2017-04-01 MED ORDER — NITROGLYCERIN 0.4 MG SL SUBL
0.4000 mg | SUBLINGUAL_TABLET | SUBLINGUAL | Status: DC | PRN
  Filled 2017-04-01: qty 1

## 2017-04-01 MED ORDER — HEPARIN BOLUS VIA INFUSION
4000.0000 [IU] | Freq: Once | INTRAVENOUS | Status: AC
  Administered 2017-04-01: 4000 [IU] via INTRAVENOUS
  Filled 2017-04-01: qty 4000

## 2017-04-01 NOTE — ED Triage Notes (Signed)
Pt here for cp onset 1 hour prior to ems arrival while watching tv. 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 In apts. sts bp started under left breast and spread to entire left breast. sts sharp severe pain when it comes but then will go away. Pt did have bypass sx 1 year ago here. Pt did take 325 asa and was given 1 ntg

## 2017-04-01 NOTE — ED Provider Notes (Signed)
[facility] DEPT Provider Note   CSN: [id number] Arrival date & time: 04/01/17  2133     History   Chief Complaint Chief Complaint  Patient presents with  . Chest Pain    HPI [name] is a 83 y.o. male.  The history is provided by the patient.  Chest Pain   This is a new problem. The current episode started 3 to 5 hours ago. The problem occurs constantly. The problem has been gradually improving. The pain is associated with rest. The pain is present in the substernal region and lateral region. The pain is at a severity of 7/10. The pain is moderate. The quality of the pain is described as heavy, sharp and pressure-like. The pain radiates to the precordial region. Associated symptoms include nausea. Pertinent negatives include no abdominal pain, no back pain, no cough, no diaphoresis, no fever, no hemoptysis, no lower extremity edema, no numbness, no palpitations, no PND, no shortness of breath, no vomiting and no weakness. He has tried nitroglycerin for the symptoms. Risk factors include being elderly and male gender.  His past medical history is significant for CAD (s/p CABG in 2016), hyperlipidemia and hypertension.  Pertinent negatives for past medical history include no seizures.    Past Medical History:  Diagnosis Date  . Carotid stenosis    a. Carotid US 9/16:  Bilateral ICA 1-39%  . Coronary artery disease    a. s/p PCI in 2005 in [place] for MI; b. NSTEMI 9/16 >> LHC with 2 v CAD >> s/p CABG   . Dementia without behavioral disturbance   . History of MI (myocardial infarction) 2005   treated in [place], [place]  . History of transesophageal echocardiography (TEE) for monitoring    a. Intra-Op TEE 9/16:  Mild LVH, EF 55-60%, normal wall motion, mild MR, no PFO  . HLD (hyperlipidemia)   . Hypertension   . Sciatica     Patient Active Problem List   Diagnosis Date Noted  . Hypothyroidism 08/22/2015  . Senile dementia 08/15/2015  . 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 S/P CABG (coronary artery bypass graft) 08/12/2015  . Fever 08/12/2015  . HCAP (healthcare-associated pneumonia) 08/12/2015  . HLD (hyperlipidemia) 08/12/2015  . Sciatica   . MI (myocardial infarction) ([facility])   . CAD (coronary artery disease) 08/07/2015  . Elevated troponin 08/01/2015  . NSTEMI (non-ST elevated myocardial infarction) ([facility])   . Essential hypertension 07/06/2015  . Coronary artery disease due to lipid rich plaque 07/06/2015  . Old MI (myocardial infarction) 07/06/2015    Past Surgical History:  Procedure Laterality Date  . CARDIAC CATHETERIZATION N/A 08/03/2015   Procedure: Left Heart Cath and Coronary Angiography;  Surgeon: Peter M Martinique, MD;  Location: [facility];  Service: Cardiovascular;  Laterality: N/A;  . CARDIAC CATHETERIZATION  08/03/2015   Procedure: Intravascular Pressure Wire/FFR Study;  Surgeon: Peter M Martinique, MD;  Location: [facility];  Service: Cardiovascular;;  . CHOLECYSTECTOMY    . CORONARY ARTERY BYPASS GRAFT N/A 08/07/2015   Procedure: CORONARY ARTERY BYPASS GRAFTING (CABG) x 3 with Endoscopic Vein Harvesting of greater saphenous vein from bilateral thighs;  Surgeon: [name], MD;  Location: [facility];  Service: Open Heart Surgery;  Laterality: N/A;  . CORONARY STENT PLACEMENT    . HERNIA REPAIR    . TEE WITHOUT CARDIOVERSION N/A 08/07/2015   Procedure: TRANSESOPHAGEAL ECHOCARDIOGRAM (TEE);  Surgeon: [name], MD;  Location: [facility];  Service: Open Heart Surgery;  Laterality:  N/A;       Home Medications    Prior to Admission medications   Medication Sig Start Date End Date Taking? Authorizing Provider  aspirin EC 81 MG tablet Take 81 mg by mouth daily.    [provider]  donepezil (ARICEPT) 5 MG tablet Take 5 mg by mouth daily with supper.     [provider]  Fish Oil-Krill Oil (KRILL OIL PLUS PO) Take 500 mg by mouth daily.    [provider]  glucosamine-chondroitin 500-400  MG tablet Take 1 tablet by mouth daily.    [provider]  levothyroxine (SYNTHROID, LEVOTHROID) 50 MCG tablet Take 50 mcg by mouth daily before breakfast.    [provider]  lisinopril (PRINIVIL,ZESTRIL) 5 MG tablet Take 1 tablet (5 mg total) by mouth daily. 09/19/15   Nani Skillern, PA-C  metoprolol tartrate (LOPRESSOR) 25 MG tablet Take 0.5 tablets (12.5 mg total) by mouth as directed. Take 1/2 tablet every AM and 1/4 tablet every PM 05/02/16   Jerline Pain, MD  Multiple Vitamin (MULTIVITAMIN WITH MINERALS) TABS tablet Take 1 tablet by mouth daily.     [provider]  pravastatin (PRAVACHOL) 40 MG tablet Take 40 mg by mouth every evening.    [provider]  Probiotic Product (PROBIOTIC PO) Take 250 mg by mouth daily.    [provider]  traMADol (ULTRAM) 50 MG tablet Take 50 mg by mouth every 6 (six) hours as needed for moderate pain.    [provider]  Turmeric 500 MG CAPS Take 500 mg by mouth daily.    [provider]    Family History Family History  Problem Relation Age of Onset  . Heart attack Father     Social History Social History  Substance Use Topics  . Smoking status: Never Smoker  . Smokeless tobacco: Never Used  . Alcohol use No     Allergies   Patient has no known allergies.   Review of Systems Review of Systems  Constitutional: Negative for chills, diaphoresis and fever.  HENT: Negative for ear pain and sore throat.   Eyes: Negative for pain and visual disturbance.  Respiratory: Negative for cough, hemoptysis and shortness of breath.   Cardiovascular: Positive for chest pain. Negative for palpitations and PND.  Gastrointestinal: Positive for nausea. Negative for abdominal pain and vomiting.  Genitourinary: Negative for dysuria and hematuria.  Musculoskeletal: Negative for arthralgias and back pain.  Skin: Negative for color change and rash.  Neurological: Negative for seizures,  syncope, weakness and numbness.  All other systems reviewed and are negative.    Physical Exam Updated Vital Signs  ED Triage Vitals  Enc Vitals Group     BP 04/01/17 2139 (!) 168/60     Pulse Rate 04/01/17 2139 62     Resp 04/01/17 2139 19     Temp 04/01/17 2139 97.7 F (36.5 C)     Temp Source 04/01/17 2139 Oral     SpO2 04/01/17 2134 97 %     Weight 04/01/17 2139 156 lb (70.8 kg)     Height 04/01/17 2139 5\' 9"  (1.753 m)     Head Circumference --      Peak Flow --      Pain Score 04/01/17 2139 2     Pain Loc --      Pain Edu? --      Excl. in [place]? --     Physical Exam  Constitutional: He is  oriented to person, place, and time. He appears well-developed and well-nourished.  HENT:  Head: Normocephalic and atraumatic.  Eyes: Conjunctivae are normal.  Neck: Normal range of motion. Neck supple.  Cardiovascular: Normal rate, regular rhythm, normal heart sounds and intact distal pulses.   No murmur heard. Pulmonary/Chest: Effort normal and breath sounds normal. No respiratory distress.  Abdominal: Soft. There is no tenderness.  Musculoskeletal: He exhibits no edema.  Neurological: He is alert and oriented to person, place, and time.  Skin: Skin is warm and dry. Capillary refill takes less than 2 seconds.  Psychiatric: He has a normal mood and affect.  Nursing note and vitals reviewed.    ED Treatments / Results  Labs (all labs ordered are listed, but only abnormal results are displayed) Labs Reviewed  COMPREHENSIVE METABOLIC PANEL - Abnormal; Notable for the following:       Result Value   Glucose, Bld 100 (*)    Total Protein 6.2 (*)    All other components within normal limits  CBC  HEPARIN LEVEL (UNFRACTIONATED)  CBC  CBG MONITORING, ED  I-STAT TROPOININ, ED    EKG  EKG Interpretation  Date/Time:  Wednesday Apr 01 2017 21:34:50 EDT Ventricular Rate:  57 PR Interval:    QRS Duration: 94 QT Interval:  410 QTC Calculation: 400 R Axis:   61 Text  Interpretation:  Sinus rhythm Ventricular premature complex Prolonged PR interval Probable left atrial enlargement diffuse T wave inversions on previous EKG have normalized Confirmed by Theotis Burrow [id number]) on 04/01/2017 9:49:33 PM       Radiology Dg Chest Portable 1 View  Result Date: 04/01/2017 CLINICAL DATA:  Chest pain today with slight shortness of breath. Open heart surgery. EXAM: PORTABLE CHEST 1 VIEW COMPARISON:  09/17/2015. FINDINGS: The heart is enlarged. Prior median sternotomy for CABG. Scarring at the LEFT base. No active infiltrates or failure. IMPRESSION: No active disease and no change from priors. Electronically Signed   By: Staci Righter M.D.   On: 04/01/2017 22:16    Procedures Procedures (including critical care time)  Medications Ordered in ED Medications  aspirin chewable tablet 324 mg (324 mg Oral Not Given 04/01/17 2142)  nitroGLYCERIN (NITROSTAT) SL tablet 0.4 mg (not administered)  heparin ADULT infusion 100 units/mL (25000 units/276mL sodium chloride 0.45%) (850 Units/hr Intravenous New Bag/Given 04/01/17 2356)  heparin bolus via infusion 4,000 Units (4,000 Units Intravenous Bolus from Bag 04/01/17 2359)     Initial Impression / Assessment and Plan / ED Course  I have reviewed the triage vital signs and the nursing notes.  Pertinent labs & imaging results that were available during my care of the patient were reviewed by me and considered in my medical decision making (see chart for details).     Fredick Schlosser is an 83 year old male with history of CAD status post CABG in 2016 who presents to the ED with chest pain. Patient vitals at time of arrival to the ED are significant for bradycardia but otherwise unremarkable and patient is without fever. Patient states that he had onset of chest pain several hours ago that was intermittent that he originally got relief with aspirin. Pain came back about an hour ago and was constant and more severe chest pain that when EMS  arrived, better with nitroglycerin. Patient is still fairly active and states that he has not had any chest pain since his CABG and that it is not common for him to have chest pain. He denies fever, chills, cough. Patient  does not have any PE or DVT risk factors. Patient had EKG performed in triage it said first degree heart block but no signs of ischemia and otherwise unremarkable EKG. Patient with unremarkable exam with good pulses throughout and clear breath sounds. To evaluate will get CBC, CMP, troponin, chest x-ray. Concern for ACS, unstable angina and less likely pneumonia.  Patient with chest x-ray that shows no signs of pneumonia, pneumothorax, widened mediastinum, lateral effusion. Patient with normal hemoglobin and anemia. Patient with unremarkable electrolytes and normal kidney function. Patient with undetectable troponin. Cardiology was consulted and came down to the ED to evaluate the patient. They will admit the patient for further workup. Concern for unstable angina. Patient started on heparin per cardiology request. Patient transferred to the floor in stable condition.  Final Clinical Impressions(s) / ED Diagnoses   Final diagnoses:  Chest pain, unspecified type  Unstable angina [facility])    New Prescriptions New Prescriptions   No medications on file     Lennice Sites, DO 04/02/17 [id number]    Little, Wenda Overland, MD 04/02/17 [phone]

## 2017-04-01 NOTE — Progress Notes (Signed)
ANTICOAGULATION CONSULT NOTE - Initial Consult  Pharmacy Consult for heparin  Indication: chest pain/ACS  No Known Allergies  Patient Measurements: Height: 5\' 9"  (175.3 cm) Weight: 156 lb (70.8 kg) IBW/kg (Calculated) : 70.7 Heparin Dosing Weight: 70 Kg  Vital Signs: Temp: 97.7 F (36.5 C) (05/09 2139) Temp Source: Oral (05/09 2139) BP: 134/65 (05/09 2245) Pulse Rate: 49 (05/09 2245)  Labs:  Recent Labs  04/01/17 2212  HGB 13.6  HCT 39.9  PLT 190  CREATININE 1.03    Estimated Creatinine Clearance: 49.6 mL/min (by C-G formula based on SCr of 1.03 mg/dL).   Medical History: Past Medical History:  Diagnosis Date  . Carotid stenosis    a. Carotid US 9/16:  Bilateral ICA 1-39%  . Coronary artery disease    a. s/p PCI in 2005 in [place] for MI; b. NSTEMI 9/16 >> LHC with 2 v CAD >> s/p CABG   . Dementia without behavioral disturbance   . History of MI (myocardial infarction) 2005   treated in [place], [place]  . History of transesophageal echocardiography (TEE) for monitoring    a. Intra-Op TEE 9/16:  Mild LVH, EF 55-60%, normal wall motion, mild MR, no PFO  . HLD (hyperlipidemia)   . Hypertension   . Sciatica     Medications:   (Not in a hospital admission)  Assessment: 33 yoM presenting with chest pain. No anticoagulation noted prior to admission. Baseline CBC WNL, troponin (-). Pharmacy asked to dose heparin.   Goal of Therapy:  Heparin level 0.3-0.7 units/ml Monitor platelets by anticoagulation protocol: Yes   Plan:  Give 4000 units bolus x 1 Start heparin infusion at 850 units/hr Check anti-Xa level in 6 hours and daily while on heparin Continue to monitor H&H and platelets  Bianco Cange L Christopherjame Carnell 04/01/2017,11:49 PM

## 2017-04-02 ENCOUNTER — Encounter (HOSPITAL_COMMUNITY)
Admission: EM | Disposition: A | Discharge status: Home Health | Payer: Self-pay | Source: Home / Self Care | Attending: Internal Medicine

## 2017-04-02 ENCOUNTER — Other Ambulatory Visit: Payer: Self-pay

## 2017-04-02 DIAGNOSIS — Z79899 Other long term (current) drug therapy: Secondary | ICD-10-CM | POA: Diagnosis not present

## 2017-04-02 DIAGNOSIS — I10 Essential (primary) hypertension: Secondary | ICD-10-CM | POA: Diagnosis present

## 2017-04-02 DIAGNOSIS — R072 Precordial pain: Secondary | ICD-10-CM | POA: Diagnosis not present

## 2017-04-02 DIAGNOSIS — I252 Old myocardial infarction: Secondary | ICD-10-CM | POA: Diagnosis not present

## 2017-04-02 DIAGNOSIS — Z951 Presence of aortocoronary bypass graft: Secondary | ICD-10-CM | POA: Diagnosis not present

## 2017-04-02 DIAGNOSIS — F0390 Unspecified dementia without behavioral disturbance: Secondary | ICD-10-CM | POA: Diagnosis present

## 2017-04-02 DIAGNOSIS — Z955 Presence of coronary angioplasty implant and graft: Secondary | ICD-10-CM | POA: Diagnosis not present

## 2017-04-02 DIAGNOSIS — E785 Hyperlipidemia, unspecified: Secondary | ICD-10-CM | POA: Diagnosis present

## 2017-04-02 DIAGNOSIS — Z8249 Family history of ischemic heart disease and other diseases of the circulatory system: Secondary | ICD-10-CM | POA: Diagnosis not present

## 2017-04-02 DIAGNOSIS — I200 Unstable angina: Secondary | ICD-10-CM

## 2017-04-02 DIAGNOSIS — I25110 Atherosclerotic heart disease of native coronary artery with unstable angina pectoris: Secondary | ICD-10-CM | POA: Diagnosis not present

## 2017-04-02 DIAGNOSIS — Z7982 Long term (current) use of aspirin: Secondary | ICD-10-CM | POA: Diagnosis not present

## 2017-04-02 DIAGNOSIS — I2583 Coronary atherosclerosis due to lipid rich plaque: Secondary | ICD-10-CM | POA: Diagnosis present

## 2017-04-02 HISTORY — PX: LEFT HEART CATH AND CORS/GRAFTS ANGIOGRAPHY: CATH118250

## 2017-04-02 LAB — CBC
HCT: 40.4 % (ref 39.0–52.0)
Hemoglobin: 13.6 g/dL (ref 13.0–17.0)
MCH: 30.6 pg (ref 26.0–34.0)
MCHC: 33.7 g/dL (ref 30.0–36.0)
MCV: 91 fL (ref 78.0–100.0)
PLATELETS: 171 10*3/uL (ref 150–400)
RBC: 4.44 MIL/uL (ref 4.22–5.81)
RDW: 13.8 % (ref 11.5–15.5)
WBC: 10.6 10*3/uL — ABNORMAL HIGH (ref 4.0–10.5)

## 2017-04-02 LAB — I-STAT TROPONIN, ED: Troponin i, poc: 0 ng/mL (ref 0.00–0.08)

## 2017-04-02 LAB — TROPONIN I

## 2017-04-02 LAB — POCT ACTIVATED CLOTTING TIME: Activated Clotting Time: 142 seconds

## 2017-04-02 LAB — PROTIME-INR
INR: 1.09
Prothrombin Time: 14.1 seconds (ref 11.4–15.2)

## 2017-04-02 LAB — HEPARIN LEVEL (UNFRACTIONATED)
Heparin Unfractionated: 0.49 IU/mL (ref 0.30–0.70)
Heparin Unfractionated: 0.14 IU/mL — ABNORMAL LOW (ref 0.30–0.70)

## 2017-04-02 SURGERY — LEFT HEART CATH AND CORS/GRAFTS ANGIOGRAPHY
Anesthesia: LOCAL

## 2017-04-02 MED ORDER — SODIUM CHLORIDE 0.9% FLUSH: 3.0000 mL | INTRAVENOUS | Status: DC | PRN

## 2017-04-02 MED ORDER — MIDAZOLAM HCL 2 MG/2ML IJ SOLN
INTRAMUSCULAR | Status: DC | PRN
  Administered 2017-04-02: 1 mg via INTRAVENOUS

## 2017-04-02 MED ORDER — ASPIRIN 300 MG RE SUPP: 300.0000 mg | RECTAL | Status: AC

## 2017-04-02 MED ORDER — CLOPIDOGREL BISULFATE 75 MG PO TABS
600.0000 mg | ORAL_TABLET | Freq: Every day | ORAL | Status: AC
  Administered 2017-04-02: 600 mg via ORAL
  Filled 2017-04-02: qty 8

## 2017-04-02 MED ORDER — IOPAMIDOL (ISOVUE-370) INJECTION 76%
INTRAVENOUS | Status: DC | PRN
  Administered 2017-04-02: 80 mL via INTRAVENOUS

## 2017-04-02 MED ORDER — TRAMADOL HCL 50 MG PO TABS: 50.0000 mg | ORAL_TABLET | Freq: Four times a day (QID) | ORAL | Status: DC | PRN

## 2017-04-02 MED ORDER — NITROGLYCERIN 2 % TD OINT
0.5000 [in_us] | TOPICAL_OINTMENT | Freq: Four times a day (QID) | TRANSDERMAL | Status: DC
  Administered 2017-04-02 – 2017-04-03 (×5): 0.5 [in_us] via TOPICAL
  Filled 2017-04-02: qty 30

## 2017-04-02 MED ORDER — MIDAZOLAM HCL 2 MG/2ML IJ SOLN
INTRAMUSCULAR | Status: AC
  Filled 2017-04-02: qty 2

## 2017-04-02 MED ORDER — SODIUM CHLORIDE 0.9 % IV SOLN: 250.0000 mL | INTRAVENOUS | Status: DC | PRN

## 2017-04-02 MED ORDER — ONDANSETRON HCL 4 MG/2ML IJ SOLN: 4.0000 mg | Freq: Four times a day (QID) | INTRAMUSCULAR | Status: DC | PRN

## 2017-04-02 MED ORDER — ASPIRIN EC 81 MG PO TBEC
81.0000 mg | DELAYED_RELEASE_TABLET | Freq: Every day | ORAL | Status: DC
  Administered 2017-04-03: 81 mg via ORAL
  Filled 2017-04-02: qty 1

## 2017-04-02 MED ORDER — SODIUM CHLORIDE 0.9 % WEIGHT BASED INFUSION
3.0000 mL/kg/h | INTRAVENOUS | Status: DC
  Administered 2017-04-02: 3 mL/kg/h via INTRAVENOUS

## 2017-04-02 MED ORDER — VERAPAMIL HCL 2.5 MG/ML IV SOLN
INTRAVENOUS | Status: AC
  Filled 2017-04-02: qty 2

## 2017-04-02 MED ORDER — HEPARIN SODIUM (PORCINE) 1000 UNIT/ML IJ SOLN
INTRAMUSCULAR | Status: AC
  Filled 2017-04-02: qty 1

## 2017-04-02 MED ORDER — SODIUM CHLORIDE 0.9 % IV SOLN: INTRAVENOUS | Status: AC

## 2017-04-02 MED ORDER — ASPIRIN 81 MG PO CHEW: 81.0000 mg | CHEWABLE_TABLET | ORAL | Status: DC

## 2017-04-02 MED ORDER — PRAVASTATIN SODIUM 40 MG PO TABS
40.0000 mg | ORAL_TABLET | Freq: Every evening | ORAL | Status: DC
  Administered 2017-04-02: 40 mg via ORAL
  Filled 2017-04-02: qty 1

## 2017-04-02 MED ORDER — HEPARIN (PORCINE) IN NACL 2-0.9 UNIT/ML-% IJ SOLN
INTRAMUSCULAR | Status: AC
  Filled 2017-04-02: qty 1000

## 2017-04-02 MED ORDER — SODIUM CHLORIDE 0.9 % WEIGHT BASED INFUSION
1.0000 mL/kg/h | INTRAVENOUS | Status: DC
  Administered 2017-04-02: 1 mL/kg/h via INTRAVENOUS

## 2017-04-02 MED ORDER — CLOPIDOGREL BISULFATE 75 MG PO TABS
75.0000 mg | ORAL_TABLET | Freq: Every day | ORAL | Status: DC
  Administered 2017-04-03: 75 mg via ORAL
  Filled 2017-04-02: qty 1

## 2017-04-02 MED ORDER — SODIUM CHLORIDE 0.9% FLUSH
3.0000 mL | Freq: Two times a day (BID) | INTRAVENOUS | Status: DC
  Administered 2017-04-02: 3 mL via INTRAVENOUS

## 2017-04-02 MED ORDER — LIDOCAINE HCL 1 % IJ SOLN
INTRAMUSCULAR | Status: AC
  Filled 2017-04-02: qty 20

## 2017-04-02 MED ORDER — OMEGA-3-ACID ETHYL ESTERS 1 G PO CAPS
1.0000 | ORAL_CAPSULE | Freq: Every day | ORAL | Status: DC
  Administered 2017-04-03: 1 g via ORAL
  Filled 2017-04-02: qty 1

## 2017-04-02 MED ORDER — SODIUM CHLORIDE 0.9 % WEIGHT BASED INFUSION: 1.0000 mL/kg/h | INTRAVENOUS | Status: DC

## 2017-04-02 MED ORDER — METOPROLOL TARTRATE 12.5 MG HALF TABLET: 12.5000 mg | ORAL_TABLET | Freq: Every day | ORAL | Status: DC

## 2017-04-02 MED ORDER — LEVOTHYROXINE SODIUM 50 MCG PO TABS
50.0000 ug | ORAL_TABLET | Freq: Every day | ORAL | Status: DC
Start: 2017-04-02 — End: 2017-04-02
  Administered 2017-04-02: 50 ug via ORAL
  Filled 2017-04-02: qty 1

## 2017-04-02 MED ORDER — LIDOCAINE HCL (PF) 1 % IJ SOLN
INTRAMUSCULAR | Status: DC | PRN
  Administered 2017-04-02: 15 mL

## 2017-04-02 MED ORDER — SODIUM CHLORIDE 0.9 % WEIGHT BASED INFUSION: 3.0000 mL/kg/h | INTRAVENOUS | Status: DC

## 2017-04-02 MED ORDER — ACETAMINOPHEN 325 MG PO TABS: 650.0000 mg | ORAL_TABLET | ORAL | Status: DC | PRN

## 2017-04-02 MED ORDER — ADULT MULTIVITAMIN W/MINERALS CH
1.0000 | ORAL_TABLET | Freq: Every day | ORAL | Status: DC
  Administered 2017-04-03: 1 via ORAL
  Filled 2017-04-02: qty 1

## 2017-04-02 MED ORDER — ASPIRIN EC 81 MG PO TBEC: 81.0000 mg | DELAYED_RELEASE_TABLET | Freq: Every day | ORAL | Status: DC

## 2017-04-02 MED ORDER — DONEPEZIL HCL 5 MG PO TABS
5.0000 mg | ORAL_TABLET | Freq: Every day | ORAL | Status: DC
  Administered 2017-04-02: 5 mg via ORAL
  Filled 2017-04-02: qty 1

## 2017-04-02 MED ORDER — IOPAMIDOL (ISOVUE-370) INJECTION 76%
INTRAVENOUS | Status: AC
  Filled 2017-04-02: qty 100

## 2017-04-02 MED ORDER — SODIUM CHLORIDE 0.9% FLUSH
3.0000 mL | Freq: Two times a day (BID) | INTRAVENOUS | Status: DC
  Administered 2017-04-02 – 2017-04-03 (×2): 3 mL via INTRAVENOUS

## 2017-04-02 MED ORDER — METOPROLOL TARTRATE 25 MG PO TABS
25.0000 mg | ORAL_TABLET | Freq: Every day | ORAL | Status: DC
  Administered 2017-04-02: 25 mg via ORAL
  Filled 2017-04-02: qty 2

## 2017-04-02 MED ORDER — ASPIRIN 81 MG PO CHEW
324.0000 mg | CHEWABLE_TABLET | ORAL | Status: AC
  Administered 2017-04-02: 324 mg via ORAL
  Filled 2017-04-02: qty 4

## 2017-04-02 MED ORDER — NITROGLYCERIN 0.4 MG SL SUBL
0.4000 mg | SUBLINGUAL_TABLET | SUBLINGUAL | Status: DC | PRN
  Administered 2017-04-02: 0.4 mg via SUBLINGUAL

## 2017-04-02 SURGICAL SUPPLY — 10 items
CATH INFINITI 5FR MULTPACK ANG (CATHETERS) ×2 IMPLANT
COVER PRB 48X5XTLSCP FOLD TPE (BAG) ×1 IMPLANT
COVER PROBE 5X48 (BAG) ×1
KIT HEART LEFT (KITS) ×2 IMPLANT
PACK CARDIAC CATHETERIZATION (CUSTOM PROCEDURE TRAY) ×2 IMPLANT
SHEATH PINNACLE 5F 10CM (SHEATH) ×2 IMPLANT
TRANSDUCER W/STOPCOCK (MISCELLANEOUS) ×2 IMPLANT
TUBING CIL FLEX 10 FLL-RA (TUBING) ×2 IMPLANT
WIRE EMERALD 3MM-J .035X150CM (WIRE) ×2 IMPLANT
WIRE HI TORQ VERSACORE-J 145CM (WIRE) ×2 IMPLANT

## 2017-04-02 NOTE — Progress Notes (Signed)
Pt had a 1.91 sec pause. Pt currently asymptomatic VSS. Will continue to monitor. Isac Caddy, RN

## 2017-04-02 NOTE — Interval H&P Note (Signed)
Cath Lab Visit (complete for each Cath Lab visit)  Clinical Evaluation Leading to the Procedure:   ACS: Yes.    Non-ACS:    Anginal Classification: CCS IV  Anti-ischemic medical therapy: Minimal Therapy (1 class of medications)  Non-Invasive Test Results: No non-invasive testing performed  Prior CABG: Previous CABG      History and Physical Interval Note:  04/02/2017 2:00 PM  Isaac [name]  has presented today for surgery, with the diagnosis of unstable angina  The various methods of treatment have been discussed with the patient and family. After consideration of risks, benefits and other options for treatment, the patient has consented to  Procedure(s): Left Heart Cath and Coronary Angiography (N/A) as a surgical intervention .  The patient's history has been reviewed, patient examined, no change in status, stable for surgery.  I have reviewed the patient's chart and labs.  Questions were answered to the patient's satisfaction.     Larae Grooms

## 2017-04-02 NOTE — Progress Notes (Signed)
Patient admitted by Dr. Haroldine Laws this morning. He has ruled out with negative enzymes. He had 2 separate episodes of chest pain yesterday, but no recurrence since he has been at rest on IV heparin overnight. All of his questions are answered regarding catheterization and possible PCI today. He appears stable to move forward with his catheterization procedure. Will go ahead and load with Plavix in case he requires intervention. He just had CABG last year and clearly would not be a candidate for another surgical procedure so I feel comfortable loading him prior to catheterization.  Sherren Mocha 04/02/2017 10:23 AM

## 2017-04-02 NOTE — H&P (Signed)
CARDIOLOGY H&P  Cardiology: Isaac [name]  HPI:  Isaac [name] is a 83 y.o. male with a hx of CAD s/p MI in 2005 tx with PCI in [place], [place], HTN, HL and mild dementia. Lives at [name].   Admitted 9/7-9/17 with a NSTEMI.  He presented to the emergency room with neck and throat [name] 1 hour after swimming. LHC demonstrated severe 2v CAD with hemodynamically significant oLAD by FFR and high grade oLCx stenosis.  He was seen by Dr. Servando [name] and underwent CABG x 3 with L-LAD, S-CFX/OM1 (bilateral LE vein harvesting).  Post op course was fairly uneventful.    Readmitted 9/18-9/20/17 with pneumonia versus post pericardiotomy syndrome (patient presented with cough, fever and left lower lobe opacity on chest x-ray; normal white count, normal lactic acid, but mildly elevated Pro-calcitonin). Blood cultures, sputum culture negative. Recovered well.  Has been doing well. Was swimming on Monday without problems. Was at home tonight and developed severe CP at rest radiating into left axilla with mild nausea. Took NTG and [name] resolved but then returned. Had stuttering [name] but finally got complete relief with additional NTG.   In ER CP free. ECG and Trop negative x 1.    Review of Systems:     Cardiac Review of Systems: {Y] = yes [ ]  = no  Chest [name] [  y  ]  Resting SOB [   ] Exertional SOB  [  ]  Orthopnea [  ]   Pedal Edema [   ]    Palpitations [  ] Syncope  [  ]   Presyncope [   ]  General Review of Systems: [Y] = yes [  ]=no Constitional: recent weight change [  ]; anorexia [  ]; fatigue [  ]; nausea [ y ]; night sweats [  ]; fever [  ]; or chills [  ];                                                                     Dental: poor dentition[  ];   Eye : blurred vision [  ]; diplopia [   ]; vision changes [  ];  Amaurosis fugax[  ]; Resp: cough [  ];  wheezing[  ];  hemoptysis[  ]; shortness of breath[  ]; paroxysmal nocturnal dyspnea[  ]; dyspnea on exertion[  ]; or orthopnea[  ];  GI:   gallstones[  ], vomiting[  ];  dysphagia[  ]; melena[  ];  hematochezia [  ]; heartburn[  ];   GU: kidney stones [  ]; hematuria[  ];   dysuria [  ];  nocturia[  ];               Skin: rash [  ], swelling[  ];, hair loss[  ];  peripheral edema[  ];  or itching[  ]; Musculosketetal: myalgias[  ];  joint swelling[  ];  joint erythema[  ];  joint [name][ y ];  back [name][  ];  Heme/Lymph: bruising[  ];  bleeding[  ];  anemia[  ];  Neuro: TIA[  ];  headaches[  ];  stroke[  ];  vertigo[  ];  seizures[  ];   paresthesias[  ];  difficulty  walking[  ];  Psych:depression[  ]; anxiety[  ];  Endocrine: diabetes[  ];  thyroid dysfunction[  ];  Other:  Past Medical History:  Diagnosis Date  . Carotid stenosis    a. Carotid US 9/16:  Bilateral ICA 1-39%  . Coronary artery disease    a. s/p PCI in 2005 in [place] for [place]; b. NSTEMI 9/16 >> LHC with 2 v CAD >> s/p CABG   . Dementia without behavioral disturbance   . History of MI (myocardial infarction) 2005   treated in [name]  . History of transesophageal echocardiography (TEE) for monitoring    a. Intra-Op TEE 9/16:  Mild LVH, EF 55-60%, normal wall motion, mild MR, no PFO  . HLD (hyperlipidemia)   . Hypertension   . Sciatica     Prior to Admission medications   Medication Sig Start Date End Date Taking? Authorizing Provider  aspirin EC 81 MG tablet Take 81 mg by mouth daily.   Yes [provider]  donepezil (ARICEPT) 5 MG tablet Take 5 mg by mouth daily with supper.    Yes [provider]  Fish Oil-Krill Oil (KRILL OIL PLUS PO) Take 500 mg by mouth daily.   Yes [provider]  glucosamine-chondroitin 500-400 MG tablet Take 1 tablet by mouth daily.   Yes [provider]  levothyroxine (SYNTHROID, LEVOTHROID) 50 MCG tablet Take 50 mcg by mouth daily before breakfast.   Yes [provider]  lisinopril (PRINIVIL,ZESTRIL) 5 MG tablet Take 1 tablet (5 mg total) by mouth daily. 09/19/15  Yes Isaac Pinks M, PA-C  metoprolol tartrate (LOPRESSOR) 25 MG tablet Take 0.5 tablets (12.5 mg total) by mouth as directed. Take 1/2 tablet every AM and 1/4 tablet every PM Patient taking differently: Take 12.5-25 mg by mouth 2 (two) times daily. Take 1 tablet every AM and 1/2 tablet every PM 05/02/16  Yes Isaac Pain, MD  Multiple Vitamin (MULTIVITAMIN WITH MINERALS) TABS tablet Take 1 tablet by mouth daily.    Yes [provider]  pravastatin (PRAVACHOL) 40 MG tablet Take 40 mg by mouth every evening.   Yes [provider]  Probiotic Product (PROBIOTIC PO) Take 250 mg by mouth daily.   Yes [provider]  traMADol (ULTRAM) 50 MG tablet Take 50 mg by mouth every 6 (six) hours as needed for moderate [name].   Yes [provider]      No Known Allergies  Social History   Social History  . Marital status: Married    Spouse name: N/A  . Number of children: N/A  . Years of education: N/A   Occupational History  . Not on file.   Social History Main Topics  . Smoking status: Never Smoker  . Smokeless tobacco: Never Used  . Alcohol use No  . Drug use: No  . Sexual activity: Not on file   Other Topics Concern  . Not on file   Social History Narrative   Lives at [name] since 08/11/15 in [place]   Married wife [place] [name]   Never smoked   Alcohol none    Family History  Problem Relation Age of Onset  . Heart attack Father     PHYSICAL EXAM: Vitals:   04/01/17 2215 04/01/17 2245  BP: (!) 149/69 134/65  Pulse: (!) 50 (!) 49  Resp: 12 11  Temp:     General:  Elderly Well appearing. No respiratory difficulty HEENT: normal Neck: supple. no JVD. Carotids 2+  bilat; no bruits. No lymphadenopathy or thryomegaly appreciated. Cor: PMI nondisplaced. [name]. Regular. occasional PVC. No rubs, gallops or murmurs. Lungs: clear Abdomen: soft, nontender, nondistended. No hepatosplenomegaly. No bruits or masses. Good bowel sounds. Extremities: no  cyanosis, clubbing, rash, trace edema Neuro: alert & oriented x 3, cranial nerves grossly intact. moves all 4 extremities w/o difficulty. Affect pleasant.  ECG: sinus brady 57 No ST-T wave abnormalities.  Occasional PVC.  Results for orders placed or performed during the [name] encounter of 04/01/17 (from the past 24 hour(s))  CBC     Status: None   Collection Time: 04/01/17 10:12 PM  Result Value Ref Range   WBC 9.1 4.0 - 10.5 K/uL   RBC 4.37 4.22 - 5.81 MIL/uL   Hemoglobin 13.6 13.0 - 17.0 g/dL   HCT 39.9 39.0 - 52.0 %   MCV 91.3 78.0 - 100.0 fL   MCH 31.1 26.0 - 34.0 pg   MCHC 34.1 30.0 - 36.0 g/dL   RDW 13.7 11.5 - 15.5 %   Platelets 190 150 - 400 K/uL  Comprehensive metabolic panel     Status: Abnormal   Collection Time: 04/01/17 10:12 PM  Result Value Ref Range   Sodium 137 135 - 145 mmol/L   Potassium 4.0 3.5 - 5.1 mmol/L   Chloride 104 101 - 111 mmol/L   CO2 26 22 - 32 mmol/L   Glucose, Bld 100 (H) 65 - 99 mg/dL   BUN 19 6 - 20 mg/dL   Creatinine, Ser 1.03 0.61 - 1.24 mg/dL   Calcium 9.4 8.9 - 10.3 mg/dL   Total Protein 6.2 (L) 6.5 - 8.1 g/dL   Albumin 3.5 3.5 - 5.0 g/dL   AST 19 15 - 41 U/L   ALT 20 17 - 63 U/L   Alkaline Phosphatase 77 38 - 126 U/L   Total Bilirubin 0.7 0.3 - 1.2 mg/dL   GFR calc non Af Amer >60 >60 mL/min   GFR calc Af Amer >60 >60 mL/min   Anion gap 7 5 - 15  I-stat troponin, ED (0, 3)     Status: None   Collection Time: 04/01/17 10:27 PM  Result Value Ref Range   Troponin i, poc 0.00 0.00 - 0.08 ng/mL   Comment 3          CBG monitoring, ED     Status: None   Collection Time: 04/01/17 10:29 PM  Result Value Ref Range   Glucose-Capillary 94 65 - 99 mg/dL   Dg Chest Portable 1 View  Result Date: 04/01/2017 CLINICAL DATA:  Chest [name] today with slight shortness of breath. Open heart surgery. EXAM: PORTABLE CHEST 1 VIEW COMPARISON:  09/17/2015. FINDINGS: The heart is enlarged. Prior median sternotomy for CABG. Scarring at the LEFT base. No  active infiltrates or failure. IMPRESSION: No active disease and no change from priors. Electronically Signed   By: Staci Righter [name].D.   On: 04/01/2017 22:16    ASSESSMENT: 1. Unstable angina 2. CAD s/p CABG 2017  3. HTN 4. HL 5. Mild dementia  PLAN/DISCUSSION:  Symptoms very concerning for [place]. ECG and troponin are normal. CP now relieved with NTG.  Will admit. Cycle markers. Treat with ASA, heparing, NTG, b-blocker and statin. Cath tomorrow.    Glori Bickers, MD  12:50 AM

## 2017-04-02 NOTE — Progress Notes (Signed)
ANTICOAGULATION CONSULT NOTE - Follow Up Consult  Pharmacy Consult for Heparin  Indication: chest pain/ACS  No Known Allergies  Patient Measurements: Height: 5\' 9"  (175.3 cm) Weight: 156 lb (70.8 kg) IBW/kg (Calculated) : 70.7  Vital Signs: Temp: 97.7 F (36.5 C) (05/09 2139) Temp Source: Oral (05/09 2139) BP: 134/65 (05/09 2245) Pulse Rate: 49 (05/09 2245)  Labs:  Recent Labs  04/01/17 2212 04/02/17 0403  HGB 13.6 13.6  HCT 39.9 40.4  PLT 190 171  HEPARINUNFRC  --  0.49  CREATININE 1.03  --   TROPONINI  --  <0.03    Estimated Creatinine Clearance: 49.6 mL/min (by C-G formula based on SCr of 1.03 mg/dL).   Assessment: Heparin for CP that occurred after swimming, initial heparin level therapeutic, but drawn a little early  Goal of Therapy:  Heparin level 0.3-0.7 units/ml Monitor platelets by anticoagulation protocol: Yes   Plan:  -Cont heparin 850 units/hr -1200 HL  Isa[facility] [name] 04/02/2017,5:16 AM

## 2017-04-02 NOTE — Progress Notes (Signed)
ANTICOAGULATION CONSULT NOTE - Follow Up Consult  Pharmacy Consult for Heparin Indication: chest pain/ACS  No Known Allergies  Patient Measurements: Height: 5\' 9"  (175.3 cm) Weight: 157 lb 11.2 oz (71.5 kg) IBW/kg (Calculated) : 70.7 Heparin Dosing Weight:    Vital Signs: Temp: 97.8 F (36.6 C) (05/10 1302) Temp Source: Oral (05/10 1302) BP: 132/53 (05/10 1302) Pulse Rate: 56 (05/10 1302)  Labs:  Recent Labs  04/01/17 2212 04/02/17 0403 04/02/17 0759 04/02/17 1127  HGB 13.6 13.6  --   --   HCT 39.9 40.4  --   --   PLT 190 171  --   --   LABPROT  --   --  14.1  --   INR  --   --  1.09  --   HEPARINUNFRC  --  0.49  --  0.14*  CREATININE 1.03  --   --   --   TROPONINI  --  <0.03 <0.03  --     Estimated Creatinine Clearance: 49.6 mL/min (by C-G formula based on SCr of 1.03 mg/dL).   Assessment:  Anticoag: IV heparin for ACS. HL 0.49 now down to 0.14. CBC stable overnight. No infusion problems per RN  Goal of Therapy:  Heparin level 0.3-0.7 units/ml Monitor platelets by anticoagulation protocol: Yes   Plan:  Increase IV heparin to 1000 units/hr. Continue to monitor H&H and platelets Plan cath today.   Isaac [name], PharmD, BCPS Clinical Staff Pharmacist Pager [phone]  Isaac [name] 04/02/2017,1:10 PM

## 2017-04-02 NOTE — Progress Notes (Signed)
Site area: Right groin a 5 french arterial sheath was removed  Site Prior to Removal:  Level 0  Pressure Applied For 15 MINUTES     BedrestBeginning at 1530p  Manual:   Yes.    Patient Status During Pull:  stable  Post Pull Groin Site:  Level 0  Post Pull Instructions Given:  Yes.    Post Pull Pulses Present:  Yes.    Dressing Applied:  Yes.    Comments:  VS remain stable during sheath pull.

## 2017-04-02 NOTE — Care Management Note (Signed)
Case Management Note  Patient Details  Name: Isaac [name] MRN: [id number] Date of Birth: [birth year]  Subjective/Objective:                 Patient admitted from [facility] for CP at rest. Hx CABG. Cath 5/10.    Action/Plan:  CM/ CSW will continue to follow for DC planning.  Expected Discharge Date:                  Expected Discharge Plan:  [place]  In-House Referral:     Discharge planning Services  CM Consult  Post Acute Care Choice:    Choice offered to:     DME Arranged:    DME Agency:     HH Arranged:    [place] Agency:     Status of Service:  In process, will continue to follow  If discussed at Long Length of Stay Meetings, dates discussed:    Additional Comments:  Carles Collet, RN 04/02/2017, 5:23 PM

## 2017-04-02 NOTE — ED Notes (Addendum)
Pt went upstairs at 0207 during down time. , please refer to paper chart for lapse in time and charting.

## 2017-04-03 ENCOUNTER — Encounter (HOSPITAL_COMMUNITY): Payer: Self-pay | Admitting: Interventional Cardiology

## 2017-04-03 DIAGNOSIS — F03A0 Unspecified dementia, mild, without behavioral disturbance, psychotic disturbance, mood disturbance, and anxiety: Secondary | ICD-10-CM

## 2017-04-03 DIAGNOSIS — F0390 Unspecified dementia without behavioral disturbance: Secondary | ICD-10-CM

## 2017-04-03 LAB — CBC
HCT: 39.1 % (ref 39.0–52.0)
Hemoglobin: 13.1 g/dL (ref 13.0–17.0)
MCH: 30.7 pg (ref 26.0–34.0)
MCHC: 33.5 g/dL (ref 30.0–36.0)
MCV: 91.6 fL (ref 78.0–100.0)
PLATELETS: 195 10*3/uL (ref 150–400)
RBC: 4.27 MIL/uL (ref 4.22–5.81)
RDW: 14 % (ref 11.5–15.5)
WBC: 11.7 10*3/uL — ABNORMAL HIGH (ref 4.0–10.5)

## 2017-04-03 MED ORDER — METOPROLOL TARTRATE 25 MG PO TABS: 25.0000 mg | ORAL_TABLET | Freq: Two times a day (BID) | ORAL | 3 refills | Status: DC

## 2017-04-03 MED ORDER — CLOPIDOGREL BISULFATE 75 MG PO TABS: 75.0000 mg | ORAL_TABLET | Freq: Every day | ORAL | 11 refills | Status: DC

## 2017-04-03 MED ORDER — METOPROLOL TARTRATE 25 MG PO TABS
25.0000 mg | ORAL_TABLET | Freq: Two times a day (BID) | ORAL | Status: DC
  Administered 2017-04-03: 12.5 mg via ORAL
  Filled 2017-04-03: qty 1

## 2017-04-03 NOTE — Discharge Summary (Signed)
Discharge Summary    Patient ID: Isaac [name],  MRN: [id number], DOB/AGE: [birth year] 83 y.o.  Admit date: 04/01/2017 Discharge date: 04/03/2017  Primary Care Provider: Lawerance [name] Primary Cardiologist: Dr. Gae [name]   Discharge Diagnoses    Principal Problem:   Unstable angina pectoris [facility]) Active Problems:   Essential hypertension   Coronary artery disease due to lipid rich plaque   HLD (hyperlipidemia)   Mild dementia   Allergies No Known Allergies     History of Present Illness     83 y.o. male with known CAD s/p CABG who presented with chest pain concerning for USAP, underwent cath 04/01/17  He presented to the ER on 5/9 with substernal CP at rest with nausea that improved after nitroglycerin. Troponins negative, EKG showed no ischemic changes. Concern for ACS and he was admitted.  Hospital Course     Consultants: None  Isaac [name] had a CABG x 3 in September 2018. This admission he ruled out with two negative enzymes but had two separate episodes of chest pains after admission. It was decided that cardiac catheterization would be the best treatment option.  His cardiac cath was done on 5/10 and showed patent grafts, no areas identified for intervention. Recommendations are to continue current medical therapy. His Beta-blocker increased to Metoprolol 25 mg BID. His blood pressure has been well controlled. Hyperlipidemia to be treated with Pravastatin. His has dementia but is doing well with no changes in mentation during this admission.  The patient has had an uncomplicated hospital course and is recovering well. The rright femoral catheter site is stable. He  has been seen by Dr. Burt [name] today and deemed ready for discharge home. All follow-up appointments have been scheduled. Discharge medications are listed below.  _____________  Discharge Vitals Blood pressure 130/70, pulse 80, temperature 98.5 F (36.9 C), temperature source Oral, resp. rate 18, height 5\' 9"   (1.753 m), weight 157 lb 11.2 oz (71.5 kg), SpO2 97 %.  Filed Weights   04/01/17 2139 04/02/17 0332  Weight: 156 lb (70.8 kg) 157 lb 11.2 oz (71.5 kg)    Labs & Radiologic Studies     CBC  Recent Labs  04/02/17 0403 04/03/17 0347  WBC 10.6* 11.7*  HGB 13.6 13.1  HCT 40.4 39.1  MCV 91.0 91.6  PLT 171 127   Basic Metabolic Panel  Recent Labs  04/01/17 2212  NA 137  K 4.0  CL 104  CO2 26  GLUCOSE 100*  BUN 19  CREATININE 1.03  CALCIUM 9.4   Liver Function Tests  Recent Labs  04/01/17 2212  AST 19  ALT 20  ALKPHOS 77  BILITOT 0.7  PROT 6.2*  ALBUMIN 3.5   Cardiac Enzymes  Recent Labs  04/02/17 0403 04/02/17 0759  TROPONINI <0.03 <0.03    Dg Chest Portable 1 View  Result Date: 04/01/2017 CLINICAL DATA:  Chest pain today with slight shortness of breath. Open heart surgery. EXAM: PORTABLE CHEST 1 VIEW COMPARISON:  09/17/2015. FINDINGS: The heart is enlarged. Prior median sternotomy for CABG. Scarring at the LEFT base. No active infiltrates or failure. IMPRESSION: No active disease and no change from priors. Electronically Signed   By: Isaac [name] M.D.   On: 04/01/2017 22:16     Diagnostic Studies/Procedures    Left Heart Cath and Cors/Grafts Angiography 04/14/2017   Conclusion     Ost LM to LM lesion, 75 %stenosed. Ost LAD lesion, 65 %stenosed. LIMA to LAD is patent.  Ost Cx  to Prox Cx lesion, 95 %stenosed. Prox Cx lesion, 95 %stenosed. SVG to OM1 and OM2 jump graft is widely patent.  Patent RCA stent.  Ost RPDA lesion, 50 %stenosed.  LV end diastolic pressure is mildly elevated.  There is no aortic valve stenosis.   No coronary lesion that would explain his symptoms.  Continue aggressive preventive therapy.     _____________    Disposition   Pt is being discharged home today in good condition.  Follow-up Plans & Appointments    Follow-up Information    Isaac Serge, NP. Go on 04/14/2017.   Specialties:  Cardiology,  Radiology Why:  Appointment time is at 3:30pm with one of Dr. Marlou [name] Nurse Practitioners, please arrive 10-15 minutes early. Contact information: [address] [place] [address] [phone]            Discharge Medications   Allergies as of 04/03/2017   No Known Allergies     Medication List    TAKE these medications   aspirin EC 81 MG tablet Take 81 mg by mouth daily.   clopidogrel 75 MG tablet Commonly known as:  PLAVIX Take 1 tablet (75 mg total) by mouth daily. Start taking on:  04/04/2017   donepezil 5 MG tablet Commonly known as:  ARICEPT Take 5 mg by mouth daily with supper.   glucosamine-chondroitin 500-400 MG tablet Take 1 tablet by mouth daily.   KRILL OIL PLUS PO Take 500 mg by mouth daily.   levothyroxine 50 MCG tablet Commonly known as:  SYNTHROID, LEVOTHROID Take 50 mcg by mouth daily before breakfast.   lisinopril 5 MG tablet Commonly known as:  PRINIVIL,ZESTRIL Take 1 tablet (5 mg total) by mouth daily.   metoprolol tartrate 25 MG tablet Commonly known as:  LOPRESSOR Take 1 tablet (25 mg total) by mouth 2 (two) times daily. Take 1/2 tablet every AM and 1/4 tablet every PM What changed:  how much to take  when to take this   multivitamin with minerals Tabs tablet Take 1 tablet by mouth daily.   pravastatin 40 MG tablet Commonly known as:  PRAVACHOL Take 40 mg by mouth every evening.   PROBIOTIC PO Take 250 mg by mouth daily.   traMADol 50 MG tablet Commonly known as:  ULTRAM Take 50 mg by mouth every 6 (six) hours as needed for moderate pain.        Outstanding Labs/Studies   None  Duration of Discharge Encounter   Greater than 30 minutes including physician time.  Isaac Glee PA-C 04/03/2017, 9:45 AM

## 2017-04-03 NOTE — Progress Notes (Addendum)
Progress Note  Patient Name: Isaac [name] Date of Encounter: 04/03/2017  Primary Cardiologist: Marlou Porch  Subjective   Feeling well. No CP or dyspnea.   Inpatient Medications    Scheduled Meds: . aspirin  324 mg Oral Once  . aspirin EC  81 mg Oral Daily  . clopidogrel  75 mg Oral Daily  . donepezil  5 mg Oral Q supper  . metoprolol tartrate  12.5 mg Oral QHS  . metoprolol tartrate  25 mg Oral Daily  . multivitamin with minerals  1 tablet Oral Daily  . nitroGLYCERIN  0.5 inch Topical Q6H  . omega-3 acid ethyl esters  1 capsule Oral Daily  . pravastatin  40 mg Oral QPM  . sodium chloride flush  3 mL Intravenous Q12H   Continuous Infusions: . sodium chloride     PRN Meds: sodium chloride, acetaminophen, ondansetron (ZOFRAN) IV, sodium chloride flush   Vital Signs    Vitals:   04/02/17 1700 04/02/17 1730 04/02/17 1957 04/03/17 0424  BP: (!) 141/43 (!) 140/58 (!) 115/57 (!) 129/53  Pulse: 83 83 81 80  Resp:   18 18  Temp:   98.3 F (36.8 C) 98.5 F (36.9 C)  TempSrc:   Oral Oral  SpO2:   97% 97%  Weight:      Height:        Intake/Output Summary (Last 24 hours) at 04/03/17 0719 Last data filed at 04/02/17 2317  Gross per 24 hour  Intake              240 ml  Output              200 ml  Net               40 ml   Filed Weights   04/01/17 2139 04/02/17 0332  Weight: 156 lb (70.8 kg) 157 lb 11.2 oz (71.5 kg)    Telemetry    NSR no arrhythmia - Personally Reviewed   Physical Exam  Elderly male in NAD GEN: No acute distress.   Neck: No JVD Cardiac: RRR, no murmurs, rubs, or gallops.  Respiratory: Clear to auscultation bilaterally. GI: Soft, nontender, non-distended  MS: No edema; No deformity. Right groin site clear Neuro:  Nonfocal  Psych: Normal affect   Labs    Chemistry Recent Labs Lab 04/01/17 2212  NA 137  K 4.0  CL 104  CO2 26  GLUCOSE 100*  BUN 19  CREATININE 1.03  CALCIUM 9.4  PROT 6.2*  ALBUMIN 3.5  AST 19  ALT 20  ALKPHOS  77  BILITOT 0.7  GFRNONAA >60  GFRAA >60  ANIONGAP 7     Hematology Recent Labs Lab 04/01/17 2212 04/02/17 0403 04/03/17 0347  WBC 9.1 10.6* 11.7*  RBC 4.37 4.44 4.27  HGB 13.6 13.6 13.1  HCT 39.9 40.4 39.1  MCV 91.3 91.0 91.6  MCH 31.1 30.6 30.7  MCHC 34.1 33.7 33.5  RDW 13.7 13.8 14.0  PLT 190 171 195    Cardiac Enzymes Recent Labs Lab 04/02/17 0403 04/02/17 0759  TROPONINI <0.03 <0.03    Recent Labs Lab 04/01/17 2227 04/02/17 0141  TROPIPOC 0.00 0.00     BNPNo results for input(s): BNP, PROBNP in the last 168 hours.   DDimer No results for input(s): DDIMER in the last 168 hours.   Radiology    Dg Chest Portable 1 View  Result Date: 04/01/2017 CLINICAL DATA:  Chest pain today with slight shortness of breath. Open  heart surgery. EXAM: PORTABLE CHEST 1 VIEW COMPARISON:  09/17/2015. FINDINGS: The heart is enlarged. Prior median sternotomy for CABG. Scarring at the LEFT base. No active infiltrates or failure. IMPRESSION: No active disease and no change from priors. Electronically Signed   By: Staci Righter M.D.   On: 04/01/2017 22:16    Cardiac Studies   Cath: Conclusion     Ost LM to LM lesion, 75 %stenosed. Ost LAD lesion, 65 %stenosed. LIMA to LAD is patent.  Ost Cx to Prox Cx lesion, 95 %stenosed. Prox Cx lesion, 95 %stenosed. SVG to OM1 and OM2 jump graft is widely patent.  Patent RCA stent.  Ost RPDA lesion, 50 %stenosed.  LV end diastolic pressure is mildly elevated.  There is no aortic valve stenosis.   No coronary lesion that would explain his symptoms.  Continue aggressive preventive therapy.     Patient Profile     83 y.o. male with known CAD s/p CABG who presented with chest pain concerning for USAP, underwent cath 04/01/17  Assessment & Plan    1. CAD with chest pain/unstable angina: cath showed patent grafts, no areas identified for intervention. Continue current medical therapy - program reviewed today. Beta-blocker increased to  metoprolol 25 mg BID.   2. HTN: BP well-controlled on metoprolol  3. Hyperlipidemia: treated with pravastatin  4. Mild dementia: seems to be doing fine no change in mentation during his hospital stay  Dispo: discharge home today with routine cardiology FU  Signed, Sherren Mocha, MD  04/03/2017, 7:19 AM

## 2017-04-03 NOTE — Care Management Note (Signed)
Case Management Note Previous CM note initiated by Carles Collet, RN 04/02/2017, 5:23 PM   Patient Details  Name: Isaac [name] MRN: [id number] Date of Birth: November 27, 1925  Subjective/Objective:                 Patient admitted from [facility] for CP at rest. Hx CABG. Cath 5/10.    Action/Plan:  CM/ CSW will continue to follow for DC planning.  Expected Discharge Date:  04/03/17               Expected Discharge Plan:  [place]  In-House Referral:  Clinical Social Work  Discharge planning Services  CM Consult  Post Acute Care Choice:    Choice offered to:     DME Arranged:    DME Agency:     HH Arranged:    [place] Agency:     Status of Service:  Completed, signed off  If discussed at [employer], dates discussed:    Discharge Disposition: home/self care  Additional Comments:  04/03/17- 1030- Augustine Leverette RN,, CM- pt for d/c home today- lives with wife at [place]. No CM needs noted for discharge.   Dawayne [name], RN 04/03/2017, 10:37 AM [phone]

## 2017-04-03 NOTE — Consult Note (Signed)
           [facility] CM Primary Care Navigator  04/03/2017  Tyrell Seifer [birth year] [phone]   Went to see patient at the bedside to identify possible discharge needs but he was alreadydischarged per staff report.  Patient was discharged back to [facility] facility today.   Primary care provider's office called and notified Horris Latino) of patient's discharge and need for post hospital follow-up and transition of care.   Made aware to refer patient to [facility] care management if deemed appropriate for services.  For questions, please contact:  Dannielle Huh, BSN, RN- [facility] Primary Care Navigator  Telephone: [phone] [place]

## 2017-04-03 NOTE — Progress Notes (Signed)
Spoke with Carlota Raspberry PA concerning pt.'s lopressor dosage and HR in th 50's. Only giving 12.5mg  per order and D?C instructions.

## 2017-04-03 NOTE — Discharge Instructions (Signed)
Call [facility] at [phone] if any bleeding, swelling or drainage at cath site, or develop new tingling to fingers or toes. May shower, no tub baths for 48 hours for groin sticks. No lifting over 5 pounds for 3 days.  No Driving for 3 days

## 2017-04-06 ENCOUNTER — Other Ambulatory Visit: Payer: Self-pay | Admitting: Cardiology

## 2017-04-06 MED ORDER — CLOPIDOGREL BISULFATE 75 MG PO TABS: 75.0000 mg | ORAL_TABLET | Freq: Every day | ORAL | 2 refills | Status: DC

## 2017-04-06 MED ORDER — PRAVASTATIN SODIUM 40 MG PO TABS: 40.0000 mg | ORAL_TABLET | Freq: Every evening | ORAL | 2 refills | Status: AC

## 2017-04-06 MED ORDER — METOPROLOL TARTRATE 25 MG PO TABS: ORAL_TABLET | ORAL | 2 refills | Status: DC

## 2017-04-06 NOTE — Telephone Encounter (Signed)
Called pt to inform him per Pam, RN, that pt would have to refill his Tramadol with his PCP. Pt verbalized understanding.

## 2017-04-06 NOTE — Telephone Encounter (Signed)
No tramadolol.  Would need to come from PCP or whichever MD ordered before.

## 2017-04-06 NOTE — Telephone Encounter (Signed)
Pt calling requesting a refill on Tramadol 50 mg tablet. Would you like to refill this medication? Please advise

## 2017-04-09 DIAGNOSIS — I209 Angina pectoris, unspecified: Secondary | ICD-10-CM | POA: Diagnosis not present

## 2017-04-14 ENCOUNTER — Encounter: Payer: Self-pay | Admitting: Cardiology

## 2017-04-14 ENCOUNTER — Ambulatory Visit (INDEPENDENT_AMBULATORY_CARE_PROVIDER_SITE_OTHER): Payer: Medicare Other | Admitting: Cardiology

## 2017-04-14 VITALS — BP 134/72 | HR 70 | Ht 69.0 in | Wt 155.0 lb

## 2017-04-14 DIAGNOSIS — I200 Unstable angina: Secondary | ICD-10-CM

## 2017-04-14 DIAGNOSIS — R079 Chest pain, unspecified: Secondary | ICD-10-CM | POA: Diagnosis not present

## 2017-04-14 DIAGNOSIS — R001 Bradycardia, unspecified: Secondary | ICD-10-CM

## 2017-04-14 DIAGNOSIS — I2510 Atherosclerotic heart disease of native coronary artery without angina pectoris: Secondary | ICD-10-CM

## 2017-04-14 DIAGNOSIS — E782 Mixed hyperlipidemia: Secondary | ICD-10-CM | POA: Diagnosis not present

## 2017-04-14 DIAGNOSIS — Z951 Presence of aortocoronary bypass graft: Secondary | ICD-10-CM | POA: Diagnosis not present

## 2017-04-14 DIAGNOSIS — I252 Old myocardial infarction: Secondary | ICD-10-CM

## 2017-04-14 DIAGNOSIS — I10 Essential (primary) hypertension: Secondary | ICD-10-CM | POA: Diagnosis not present

## 2017-04-14 NOTE — Patient Instructions (Signed)
Medication Instructions:  Your physician recommends that you continue on your current medications as directed. Please refer to the Current Medication list given to you today.    Labwork: None Ordered   Testing/Procedures: None Ordered   Follow-Up: Your physician recommends that you schedule a follow-up appointment in: 3-4 months with Dr. Marlou Porch  Any Other Special Instructions Will Be Listed Below (If Applicable).     If you need a refill on your cardiac medications before your next appointment, please call your pharmacy.  Thank you for choosing [facility]

## 2017-04-14 NOTE — Progress Notes (Signed)
Cardiology Office Note   Date:  04/14/2017   ID:  Isaac [name], DOB August 05, 1926, MRN [id number]  PCP:  Lawerance Cruel, MD  Cardiologist: Dr. Marlou Porch     Chief Complaint  Patient presents with  . Hospitalization Follow-up    patent grafts with cath.      History of Present Illness: Isaac [name] is a 83 y.o. male who presents for post hospitalization 04/01/17 to 04/03/17 with unstable angina with hx of CAD s/p CABG.  Pain began 5/9 -substernal CP with nausea and improved with NTG.  troponins were neg.   Cardiac cath was done 04/02/17 by Dr. Irish Lack and he does have native CAD his grafts were patent. And RCA astent was patent no coronary lesion that would explain hi symptoms.  His BB was increased. To 25 mg BID but his HR dropped down to 50 so Lopressor decreased to 12.5 in AM and 6.25 in PM.  HR is 70 today.  Today he has no chest pain and no SOB.  He has had some diarrhea but cleared.  He swims in competition and plans to do this in the summer, will begin swimming soon.  No complaints at groin site.  He is concerned about rt hip pain may be claudication, he has 2 + pedal pulse in that leg.  If continues to bother him he would need dopplers.     Past Medical History:  Diagnosis Date  . Carotid stenosis    a. Carotid US 9/16:  Bilateral ICA 1-39%  . Coronary artery disease    a. s/p PCI in 2005 in [place] for MI; b. NSTEMI 9/16 >> LHC with 2 v CAD >> s/p CABG   . Dementia without behavioral disturbance   . History of MI (myocardial infarction) 2005   treated in [place], [place]  . History of transesophageal echocardiography (TEE) for monitoring    a. Intra-Op TEE 9/16:  Mild LVH, EF 55-60%, normal wall motion, mild MR, no PFO  . HLD (hyperlipidemia)   . Hypertension   . Sciatica     Past Surgical History:  Procedure Laterality Date  . CARDIAC CATHETERIZATION N/A 08/03/2015   Procedure: Left Heart Cath and Coronary Angiography;  Surgeon: Peter M Martinique, MD;  Location: [place] [facility]  [facility];  Service: Cardiovascular;  Laterality: N/A;  . CARDIAC CATHETERIZATION  08/03/2015   Procedure: Intravascular Pressure Wire/FFR Study;  Surgeon: Peter M Martinique, MD;  Location: [place] [facility];  Service: Cardiovascular;;  . CHOLECYSTECTOMY    . CORONARY ARTERY BYPASS GRAFT N/A 08/07/2015   Procedure: CORONARY ARTERY BYPASS GRAFTING (CABG) x 3 with Endoscopic Vein Harvesting of greater saphenous vein from bilateral thighs;  Surgeon: [name], MD;  Location: [place];  Service: Open Heart Surgery;  Laterality: N/A;  . CORONARY STENT PLACEMENT    . HERNIA REPAIR    . LEFT HEART CATH AND CORS/GRAFTS ANGIOGRAPHY N/A 04/02/2017   Procedure: Left Heart Cath and Cors/Grafts Angiography;  Surgeon: Jettie Booze, MD;  Location: [place] [facility];  Service: Cardiovascular;  Laterality: N/A;  . TEE WITHOUT CARDIOVERSION N/A 08/07/2015   Procedure: TRANSESOPHAGEAL ECHOCARDIOGRAM (TEE);  Surgeon: [name], MD;  Location: [place];  Service: Open Heart Surgery;  Laterality: N/A;     Current Outpatient Prescriptions  Medication Sig Dispense Refill  . aspirin EC 81 MG tablet Take 81 mg by mouth daily.    . clopidogrel (PLAVIX) 75 MG tablet Take 1 tablet (75 mg total) by mouth  daily. 90 tablet 2  . donepezil (ARICEPT) 5 MG tablet Take 5 mg by mouth daily with supper.     . Fish Oil-Krill Oil (KRILL OIL PLUS PO) Take 500 mg by mouth daily.    Marland Kitchen glucosamine-chondroitin 500-400 MG tablet Take 1 tablet by mouth daily.    Marland Kitchen levothyroxine (SYNTHROID, LEVOTHROID) 50 MCG tablet Take 50 mcg by mouth daily before breakfast.    . lisinopril (PRINIVIL,ZESTRIL) 5 MG tablet Take 1 tablet (5 mg total) by mouth daily. 30 tablet 1  . metoprolol tartrate (LOPRESSOR) 25 MG tablet Take 1/2 tablet every AM by mouth and 1/4 tablet every PM by mouth daily 90 tablet 2  . Multiple Vitamin (MULTIVITAMIN WITH MINERALS) TABS tablet Take 1 tablet by mouth daily.     . pravastatin (PRAVACHOL) 40 MG tablet Take 1  tablet (40 mg total) by mouth every evening. 90 tablet 2  . Probiotic Product (PROBIOTIC PO) Take 250 mg by mouth daily.    . traMADol (ULTRAM) 50 MG tablet Take 50 mg by mouth every 6 (six) hours as needed for moderate pain.     No current facility-administered medications for this visit.     Allergies:   Patient has no known allergies.    Social History:  The patient  reports that he has never smoked. He has never used smokeless tobacco. He reports that he does not drink alcohol or use drugs.   Family History:  The patient's family history includes Heart attack in his father.    ROS:  General:no colds or fevers, no weight changes Skin:no rashes or ulcers HEENT:no blurred vision, no congestion CV:see HPI PUL:see HPI GI:+ diarrhea though has stopped, no constipation or melena, no indigestion GU:no hematuria, no dysuria MS:no joint pain, no claudication Neuro:no syncope, no lightheadedness Endo:no diabetes, no thyroid disease  Wt Readings from Last 3 Encounters:  04/14/17 155 lb (70.3 kg)  04/02/17 157 lb 11.2 oz (71.5 kg)  12/23/16 159 lb (72.1 kg)     PHYSICAL EXAM: VS:  BP 134/72   Pulse 70   Ht 5\' 9"  (1.753 m)   Wt 155 lb (70.3 kg)   BMI 22.89 kg/m  , BMI Body mass index is 22.89 kg/m. General:Pleasant affect, NAD Skin:Warm and dry, brisk capillary refill HEENT:normocephalic, sclera clear, mucus membranes moist Neck:supple, no JVD, no bruits  Heart:S1S2 RRR without murmur, gallup, rub or click Lungs:clear without rales, rhonchi, or wheezes WNU:UVOZ, non tender, + BS, do not palpate liver spleen or masses Ext:no lower ext edema, 2+ pedal pulses, 2+ radial pulses Neuro:alert and oriented, MAE, follows commands, + facial symmetry    EKG:  EKG is NOT ordered today.    Recent Labs: 04/01/2017: ALT 20; BUN 19; Creatinine, Ser 1.03; Potassium 4.0; Sodium 137 04/03/2017: Hemoglobin 13.1; Platelets 195    Lipid Panel    Component Value Date/Time   CHOL 133  08/02/2015 0551   TRIG 64 08/02/2015 0551   HDL 42 08/02/2015 0551   CHOLHDL 3.2 08/02/2015 0551   VLDL 13 08/02/2015 0551   LDLCALC 78 08/02/2015 0551       Other studies Reviewed: Additional studies/ records that were reviewed today include: . Procedures   Left Heart Cath and Cors/Grafts Angiography  Conclusion     Ost LM to LM lesion, 75 %stenosed. Ost LAD lesion, 65 %stenosed. LIMA to LAD is patent.  Ost Cx to Prox Cx lesion, 95 %stenosed. Prox Cx lesion, 95 %stenosed. SVG to OM1 and OM2 jump graft  is widely patent.  Patent RCA stent.  Ost RPDA lesion, 50 %stenosed.  LV end diastolic pressure is mildly elevated.  There is no aortic valve stenosis.   No coronary lesion that would explain his symptoms.  Continue aggressive preventive therapy.      ASSESSMENT AND PLAN:  1.  Chest pain resolved no further episodes, follow up with Dr. Marlou Porch in 3-4 months.    2. HTN essential controlled  3. CAD with hx CABG with patent grafts on recent cath  4. HLD on statin  5. Mild dementia.- still driving, doing well.    Current medicines are reviewed with the patient today.  The patient Has no concerns regarding medicines.  The following changes have been made:  See above Labs/ tests ordered today include:see above  Disposition:   FU:  see above  Signed, Cecilie Kicks, NP  04/14/2017 3:47 PM    [place], [place], [place], [place] Phone: [phone]; Fax: [phone]

## 2017-05-19 ENCOUNTER — Telehealth: Payer: Self-pay | Admitting: *Deleted

## 2017-05-19 NOTE — Telephone Encounter (Signed)
Patient left a msg on the refill vm reporting what he feels may be a possible side effect of the clopidogrel. He stated that when he bruises, the blood travels further that it normally would. He would like a call back with advice on this. He did not leave a call back number. Please advise. Thanks, MI

## 2017-05-19 NOTE — Telephone Encounter (Signed)
Spoke with pt who is reporting the Plavix is causing more bruising than he expected and would think is normal.  He is also taking ASA 81 mg a day.  He would like to know if he should decrease th amount of Plavix he is taking.  Advised to continue medication as listed and to c/b if bruising worsens or he notes any bleeding.  Aware I will forward this to Dr Marlou Porch for review.

## 2017-05-21 NOTE — Telephone Encounter (Signed)
Pt aware to stop Plavix and continue ASA.  He will c/b if further questions or concerns.

## 2017-05-21 NOTE — Telephone Encounter (Signed)
Stop Plavix Continue ASA 81 QD Candee Furbish, MD

## 2017-05-25 DIAGNOSIS — H903 Sensorineural hearing loss, bilateral: Secondary | ICD-10-CM | POA: Diagnosis not present

## 2017-07-09 DIAGNOSIS — H903 Sensorineural hearing loss, bilateral: Secondary | ICD-10-CM | POA: Diagnosis not present

## 2017-07-16 DIAGNOSIS — H524 Presbyopia: Secondary | ICD-10-CM | POA: Diagnosis not present

## 2017-07-16 DIAGNOSIS — H353131 Nonexudative age-related macular degeneration, bilateral, early dry stage: Secondary | ICD-10-CM | POA: Diagnosis not present

## 2017-07-16 DIAGNOSIS — Z961 Presence of intraocular lens: Secondary | ICD-10-CM | POA: Diagnosis not present

## 2017-07-31 DIAGNOSIS — Z23 Encounter for immunization: Secondary | ICD-10-CM | POA: Diagnosis not present

## 2017-08-06 DIAGNOSIS — H903 Sensorineural hearing loss, bilateral: Secondary | ICD-10-CM | POA: Diagnosis not present

## 2017-08-10 ENCOUNTER — Other Ambulatory Visit: Payer: Self-pay | Admitting: Otolaryngology

## 2017-08-10 DIAGNOSIS — H903 Sensorineural hearing loss, bilateral: Secondary | ICD-10-CM

## 2017-08-13 ENCOUNTER — Ambulatory Visit
Admission: RE | Admit: 2017-08-13 | Discharge: 2017-08-13 | Disposition: A | Discharge status: Home / Self Care | Payer: Medicare Other | Source: Ambulatory Visit | Attending: Otolaryngology | Admitting: Otolaryngology

## 2017-08-13 DIAGNOSIS — H903 Sensorineural hearing loss, bilateral: Secondary | ICD-10-CM

## 2017-08-13 DIAGNOSIS — J324 Chronic pansinusitis: Secondary | ICD-10-CM | POA: Diagnosis not present

## 2017-08-19 ENCOUNTER — Ambulatory Visit: Payer: Medicare Other | Admitting: Cardiology

## 2017-08-25 ENCOUNTER — Ambulatory Visit (INDEPENDENT_AMBULATORY_CARE_PROVIDER_SITE_OTHER): Payer: Medicare Other | Admitting: Cardiology

## 2017-08-25 ENCOUNTER — Encounter: Payer: Self-pay | Admitting: Cardiology

## 2017-08-25 VITALS — BP 138/78 | HR 64 | Ht 69.0 in | Wt 155.0 lb

## 2017-08-25 DIAGNOSIS — I2510 Atherosclerotic heart disease of native coronary artery without angina pectoris: Secondary | ICD-10-CM | POA: Diagnosis not present

## 2017-08-25 DIAGNOSIS — I6523 Occlusion and stenosis of bilateral carotid arteries: Secondary | ICD-10-CM | POA: Diagnosis not present

## 2017-08-25 DIAGNOSIS — Z01818 Encounter for other preprocedural examination: Secondary | ICD-10-CM

## 2017-08-25 DIAGNOSIS — Z951 Presence of aortocoronary bypass graft: Secondary | ICD-10-CM

## 2017-08-25 DIAGNOSIS — I200 Unstable angina: Secondary | ICD-10-CM | POA: Diagnosis not present

## 2017-08-25 DIAGNOSIS — Z0000 Encounter for general adult medical examination without abnormal findings: Secondary | ICD-10-CM | POA: Diagnosis not present

## 2017-08-25 DIAGNOSIS — I252 Old myocardial infarction: Secondary | ICD-10-CM | POA: Diagnosis not present

## 2017-08-25 DIAGNOSIS — E7800 Pure hypercholesterolemia, unspecified: Secondary | ICD-10-CM | POA: Diagnosis not present

## 2017-08-25 NOTE — Patient Instructions (Addendum)
Medication Instructions:  The current medical regimen is effective;  continue present plan and medications.  Labwork: Please have blood work today. (CBC, CMP, PT AND PTT)  Follow-Up: Follow up in 1 year with Dr. Marlou Porch.  You will receive a letter in the mail 2 months before you are due.  Please call us when you receive this letter to schedule your follow up appointment.  If you need a refill on your cardiac medications before your next appointment, please call your pharmacy.  Thank you for choosing [facility]!!

## 2017-08-25 NOTE — Progress Notes (Signed)
Cardiology Office Note   Date:  08/25/2017   ID:  Isaac [name], DOB 04-30-1926, MRN [id number]  PCP:  Lawerance Cruel, MD  Cardiologist:  Dr. Candee Furbish   Electrophysiologist:  n/a     History of Present Illness: Isaac [name] is a 83 y.o. male with a hx of CAD s/p MI in 2005 tx with PCI in [place], [place], HTN, HL.  Established here in 8/16.    Admitted 9/7-9/17/2016 with a NSTEMI.  He presented to the emergency room with neck and throat pain 1 hour after swimming. LHC demonstrated severe 2v CAD with hemodynamically significant oLAD by FFR and high grade oLCx stenosis.  He was seen by Dr. Servando Snare and underwent CABG x 3 with L-LAD, S-CFX/OM1 (bilateral LE vein harvesting).  Post op course was fairly uneventful.    Readmitted 9/18-9/20/2016 with pneumonia versus post pericardiotomy syndrome (patient presented with cough, fever and left lower lobe opacity on chest x-ray; normal white count, normal lactic acid, but mildly elevated Pro-calcitonin). Blood cultures, sputum culture negative.  He was admitted in May 2018 with chest discomfort, substernal. Troponins were negative. Cardiac catheterization done on 04/02/17 by Dr. Irish Lack showed that his grafts were patent.  08/25/17 - Has had some diarrhea but it has improved with imodium. Nose running at same time. He states that the Imodium helped with the nose running as well as the diarrhea. He is trying alter his amount of vegetables. Denies any anginal symptoms. He still remains quite active. He is a Engineer, manufacturing still. Overall doing quite well without any anginal symptoms.   Studies/Reports Reviewed Today:  Recent office visit records reviewed today.  Intraoperative TEE 08/07/15 Mild LVH, EF 55-60%, normal wall motion, mild MR, no PFO  Carotid US 08/04/15 Bilateral ICA 1-39%  LHC 08/03/15 LAD: Ostial 65% (FFR 0.75), proximal 30%, ostial D1 99% LCx: Ostial 95%, proximal 95% RCA: Mid-distal stent okay  EF 55-65%   Past  Medical History:  Diagnosis Date  . Carotid stenosis    a. Carotid US 9/16:  Bilateral ICA 1-39%  . Coronary artery disease    a. s/p PCI in 2005 in [place] for MI; b. NSTEMI 9/16 >> LHC with 2 v CAD >> s/p CABG   . Dementia without behavioral disturbance   . History of MI (myocardial infarction) 2005   treated in [place], [place]  . History of transesophageal echocardiography (TEE) for monitoring    a. Intra-Op TEE 9/16:  Mild LVH, EF 55-60%, normal wall motion, mild MR, no PFO  . HLD (hyperlipidemia)   . Hypertension   . Sciatica     Past Surgical History:  Procedure Laterality Date  . CARDIAC CATHETERIZATION N/A 08/03/2015   Procedure: Left Heart Cath and Coronary Angiography;  Surgeon: Peter M Martinique, MD;  Location: [facility];  Service: Cardiovascular;  Laterality: N/A;  . CARDIAC CATHETERIZATION  08/03/2015   Procedure: Intravascular Pressure Wire/FFR Study;  Surgeon: Peter M Martinique, MD;  Location: [facility];  Service: Cardiovascular;;  . CHOLECYSTECTOMY    . CORONARY ARTERY BYPASS GRAFT N/A 08/07/2015   Procedure: CORONARY ARTERY BYPASS GRAFTING (CABG) x 3 with Endoscopic Vein Harvesting of greater saphenous vein from bilateral thighs;  Surgeon: [name], MD;  Location: [facility];  Service: Open Heart Surgery;  Laterality: N/A;  . CORONARY STENT PLACEMENT    . HERNIA REPAIR    . LEFT HEART CATH AND CORS/GRAFTS ANGIOGRAPHY N/A 04/02/2017   Procedure: Left Heart Cath and  Cors/Grafts Angiography;  Surgeon: Jettie Booze, MD;  Location: [facility];  Service: Cardiovascular;  Laterality: N/A;  . TEE WITHOUT CARDIOVERSION N/A 08/07/2015   Procedure: TRANSESOPHAGEAL ECHOCARDIOGRAM (TEE);  Surgeon: [name], MD;  Location: [facility];  Service: Open Heart Surgery;  Laterality: N/A;     Current Outpatient Prescriptions  Medication Sig Dispense Refill  . aspirin EC 81 MG tablet Take 81 mg by mouth daily.    Marland Kitchen donepezil (ARICEPT) 5 MG tablet Take 5 mg by  mouth daily with supper.     . Fish Oil-Krill Oil (KRILL OIL PLUS PO) Take 500 mg by mouth daily.    Marland Kitchen glucosamine-chondroitin 500-400 MG tablet Take 1 tablet by mouth daily.    Marland Kitchen levothyroxine (SYNTHROID, LEVOTHROID) 50 MCG tablet Take 50 mcg by mouth daily before breakfast.    . lisinopril (PRINIVIL,ZESTRIL) 5 MG tablet Take 1 tablet (5 mg total) by mouth daily. 30 tablet 1  . metoprolol tartrate (LOPRESSOR) 25 MG tablet Take 1/2 tablet every AM by mouth and 1/4 tablet every PM by mouth daily 90 tablet 2  . Multiple Vitamin (MULTIVITAMIN WITH MINERALS) TABS tablet Take 1 tablet by mouth daily.     . pravastatin (PRAVACHOL) 40 MG tablet Take 1 tablet (40 mg total) by mouth every evening. 90 tablet 2  . Probiotic Product (PROBIOTIC PO) Take 250 mg by mouth daily.    . traMADol (ULTRAM) 50 MG tablet Take 50 mg by mouth every 6 (six) hours as needed for moderate pain.    . Turmeric 400 MG CAPS Take 1 capsule by mouth daily.     No current facility-administered medications for this visit.     Allergies:   Patient has no known allergies.    Social History:  The patient  reports that he has never smoked. He has never used smokeless tobacco. He reports that he does not drink alcohol or use drugs. Remote 2 year smoking years ago while in college.  Family History:  The patient's family history includes Heart attack in his father.    ROS:   Please see the history of present illness.   Review of Systems  All other systems reviewed and are negative.     PHYSICAL EXAM: VS:  BP 138/78   Pulse 64   Ht 5\' 9"  (1.753 m)   Wt 155 lb (70.3 kg)   BMI 22.89 kg/m     Wt Readings from Last 3 Encounters:  08/25/17 155 lb (70.3 kg)  04/14/17 155 lb (70.3 kg)  04/02/17 157 lb 11.2 oz (71.5 kg)     GEN: Well nourished, well developed, in no acute distress Looks younger than stated age. HEENT: normal hearing aid in place Neck: no JVD, carotid bruits, or masses Cardiac: RRR; no murmurs, rubs, or  gallops,no edema  Respiratory:  clear to auscultation bilaterally, normal work of breathing GI: soft, nontender, nondistended, + BS MS: no deformity or atrophy  Skin: warm and dry, no rash Neuro:  Alert and Oriented x 3, Strength and sensation are intact Psych: euthymic mood, full affect   EKG:  04/24/16-first-degree AV block, sinus bradycardia rate 51, PVC. Personally viewed.  Chest x-ray 04/01/17-FINDINGS: The heart is enlarged. Prior median sternotomy for CABG. Scarring at the LEFT base. No active infiltrates or failure.  IMPRESSION: No active disease and no change from priors.  Cardiac catheterization 04/02/17-patent grafts   Recent Labs: 04/01/2017: ALT 20; BUN 19; Creatinine, Ser 1.03; Potassium 4.0; Sodium 137 04/03/2017:  Hemoglobin 13.1; Platelets 195    Lipid Panel    Component Value Date/Time   CHOL 133 08/02/2015 0551   TRIG 64 08/02/2015 0551   HDL 42 08/02/2015 0551   CHOLHDL 3.2 08/02/2015 0551   VLDL 13 08/02/2015 0551   LDLCALC 78 08/02/2015 0551      ASSESSMENT AND PLAN:  1. CAD:  Hx of MI in 2005 tx with PCI and NSTEMI followed by 3-vessel CABG in Sept 2016 readmitted shortly after with pneumonia vs post-pericardiotomy syndrome.  He completed rehab at [facility]. Repeat heart catheterization on 04/02/17 was reassuring as well, patent grafts. This was done in the setting of chest discomfort. He is doing very well and has had no recurrence of symptoms.  Continue ASA, statin, beta-blocker. Doing very well. No changes.  2. HTN:  Well controlled on lisinopril 5mg  daily and metoprolol 12.5mg  BID. Overall doing well. No changes. Watch closely with beta blocker and first-degree AV block.  3. Hyperlipidemia:  Continue statin. No changes made, no myalgias.  4. Carotid Stenosis:  Pre-CABG dopplers with minimal plaque.  Asymptomatic, no bruits.  Continue with secondary prevention. No strokelike symptoms.  5. Sinus bradycardia-heart rate was 46, 51 on EKG. He also had a  first-degree AV block as well.Looks like he is still taking his metoprolol low-dose. He has not had any adverse effects from this. We will continue. No syncope.  6. Preoperative risk assessment for cochlear implant, left-Dr. Ander Purpura. He may proceed with cochlear implant with moderate overall cardiac risk based mainly on his age and history of prior MI and CABG. Overall he has been doing quite well. No further cardiac recommendations at this time. Be careful with increased beta blocker use given his first-degree AV block.    Disposition:    FU with Dr. Candee Furbish 12 months.    Signed, Candee Furbish, MD   08/25/2017 3:27 PM    [facility] [place], [place],   [address] Phone: [phone]; Fax: [phone]

## 2017-08-26 ENCOUNTER — Telehealth: Payer: Self-pay | Admitting: Cardiology

## 2017-08-26 LAB — CBC
HEMATOCRIT: 41.8 % (ref 37.5–51.0)
HEMOGLOBIN: 14.6 g/dL (ref 13.0–17.7)
MCH: 31.3 pg (ref 26.6–33.0)
MCHC: 34.9 g/dL (ref 31.5–35.7)
MCV: 90 fL (ref 79–97)
PLATELETS: 293 10*3/uL (ref 150–379)
RBC: 4.67 x10E6/uL (ref 4.14–5.80)
RDW: 13.7 % (ref 12.3–15.4)
WBC: 10.1 10*3/uL (ref 3.4–10.8)

## 2017-08-26 LAB — PROTIME-INR
INR: 1.1 (ref 0.8–1.2)
Prothrombin Time: 10.9 s (ref 9.1–12.0)

## 2017-08-26 LAB — COMPREHENSIVE METABOLIC PANEL
ALK PHOS: 100 IU/L (ref 39–117)
ALT: 16 IU/L (ref 0–44)
AST: 23 IU/L (ref 0–40)
Albumin/Globulin Ratio: 1.8 (ref 1.2–2.2)
Albumin: 4.2 g/dL (ref 3.5–4.7)
BUN/Creatinine Ratio: 22 (ref 10–24)
BUN: 20 mg/dL (ref 8–27)
Bilirubin Total: 0.4 mg/dL (ref 0.0–1.2)
CALCIUM: 10 mg/dL (ref 8.6–10.2)
CO2: 25 mmol/L (ref 20–29)
CREATININE: 0.9 mg/dL (ref 0.76–1.27)
Chloride: 103 mmol/L (ref 96–106)
GFR calc Af Amer: 87 mL/min/{1.73_m2} (ref >59)
GFR calc non Af Amer: 75 mL/min/{1.73_m2} (ref >59)
GLUCOSE: 113 mg/dL — AB (ref 65–99)
Globulin, Total: 2.3 g/dL (ref 1.5–4.5)
Potassium: 4.4 mmol/L (ref 3.5–5.2)
Sodium: 141 mmol/L (ref 134–144)
Total Protein: 6.5 g/dL (ref 6.0–8.5)

## 2017-08-26 NOTE — Telephone Encounter (Signed)
Wife informed and message routed to Dr. Thornell Mule.

## 2017-08-26 NOTE — Telephone Encounter (Signed)
New message    Pt wife is returning call. She said someone called pt son and they are not sure why. Please call.

## 2017-08-26 NOTE — Telephone Encounter (Signed)
Notes recorded by [facility], [place] on 08/26/2017 at 8:35 AM EDT Attempted to call phone was busy will try at a later time. ------  Notes recorded by Jerline Pain, MD on 08/26/2017 at 6:18 AM EDT Labs reassuring. OK for surgery. Dr. Cresenciano Lick.  Candee Furbish, MD

## 2017-09-04 DIAGNOSIS — H9190 Unspecified hearing loss, unspecified ear: Secondary | ICD-10-CM | POA: Diagnosis not present

## 2017-09-04 DIAGNOSIS — E039 Hypothyroidism, unspecified: Secondary | ICD-10-CM | POA: Diagnosis not present

## 2017-09-04 DIAGNOSIS — G309 Alzheimer's disease, unspecified: Secondary | ICD-10-CM | POA: Diagnosis not present

## 2017-09-04 DIAGNOSIS — E7800 Pure hypercholesterolemia, unspecified: Secondary | ICD-10-CM | POA: Diagnosis not present

## 2017-09-04 DIAGNOSIS — I10 Essential (primary) hypertension: Secondary | ICD-10-CM | POA: Diagnosis not present

## 2017-09-09 DIAGNOSIS — H903 Sensorineural hearing loss, bilateral: Secondary | ICD-10-CM | POA: Diagnosis not present

## 2017-09-10 ENCOUNTER — Other Ambulatory Visit: Payer: Self-pay | Admitting: Cardiology

## 2017-09-17 NOTE — Pre-Procedure Instructions (Signed)
Isaac [name]  09/17/2017      [id number] [place], [place] [address] [address] Phone: [phone] Fax: [place], [place] [address] [place] [address] Phone: [phone] Fax: [phone]    Your procedure is scheduled on Wednesday, September 23, 2017  Report to [facility] Admitting Entrance "A" at 5:30 A.M.   Call this number if you have problems the morning of surgery:  [phone]   Remember:  Do not eat food or drink liquids after midnight.  Take these medicines the morning of surgery with A SIP OF WATER: Donepezil (ARICEPT), Levothyroxine (SYNTHROID, LEVOTHROID), and Metoprolol tartrate (LOPRESSOR). If needed TraMADol Veatrice Bourbon) for pain.  Follow your doctor's instruction regarding Aspirin.  As of today stop taking all Aspirins and Asprin producsts, Vitamins, Fish oils, and Herbal medications. Also stop all NSAIDS i.e. Advil, Ibuprofen, Motrin, Aleve, Anaprox, Naproxen, BC and Goody Powders.   Do not wear jewelry.  Do not wear lotions, powders, colognes, or deodorant.  Do not shave 48 hours prior to surgery.  Men may shave face and neck.  Do not bring valuables to the hospital.  [facility] is not responsible for any belongings or valuables.  Contacts, dentures or bridgework may not be worn into surgery.  Leave your suitcase in the car.  After surgery it may be brought to your room.  For patients admitted to the hospital, discharge time will be determined by your treatment team.  Patients discharged the day of surgery will not be allowed to drive home.   Special instructions:  [facility]- Preparing For Surgery  Before surgery, you can play an important role. Because skin is not sterile, your skin needs to be as free of germs as possible. You can reduce the number of germs on your skin by washing with CHG (chlorahexidine gluconate)  Soap before surgery.  CHG is an antiseptic cleaner which kills germs and bonds with the skin to continue killing germs even after washing.  Please do not use if you have an allergy to CHG or antibacterial soaps. If your skin becomes reddened/irritated stop using the CHG.  Do not shave (including legs and underarms) for at least 48 hours prior to first CHG shower. It is OK to shave your face.  Please follow these instructions carefully.   1. Shower the NIGHT BEFORE SURGERY and the MORNING OF SURGERY with CHG.   2. If you chose to wash your hair, wash your hair first as usual with your normal shampoo.  3. After you shampoo, rinse your hair and body thoroughly to remove the shampoo.  4. Use CHG as you would any other liquid soap. You can apply CHG directly to the skin and wash gently with a scrungie or a clean washcloth.   5. Apply the CHG Soap to your body ONLY FROM THE NECK DOWN.  Do not use on open wounds or open sores. Avoid contact with your eyes, ears, mouth and genitals (private parts). Wash Face and genitals (private parts)  with your normal soap.  6. Wash thoroughly, paying special attention to the area where your surgery will be performed.  7. Thoroughly rinse your body with warm water from the neck down.  8. DO NOT shower/wash with your normal soap after using and rinsing off the CHG Soap.  9. Pat yourself dry with a CLEAN TOWEL.  10. Wear CLEAN PAJAMAS  to bed the night before surgery, wear comfortable clothes the morning of surgery  11. Place CLEAN SHEETS on your bed the night of your first shower and DO NOT SLEEP WITH PETS.  Day of Surgery: Do not apply any deodorants/lotions. Please wear clean clothes to the hospital/surgery center.    Please read over the following fact sheets that you were given. Pain Booklet, Coughing and Deep Breathing and Surgical Site Infection Prevention
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
## 2017-09-18 ENCOUNTER — Encounter (HOSPITAL_COMMUNITY): Payer: Self-pay

## 2017-09-18 ENCOUNTER — Encounter (HOSPITAL_COMMUNITY)
Admission: RE | Admit: 2017-09-18 | Discharge: 2017-09-18 | Disposition: A | Discharge status: Home / Self Care | Payer: Medicare Other | Source: Ambulatory Visit | Attending: Otolaryngology | Admitting: Otolaryngology

## 2017-09-18 DIAGNOSIS — I10 Essential (primary) hypertension: Secondary | ICD-10-CM | POA: Diagnosis not present

## 2017-09-18 DIAGNOSIS — Z951 Presence of aortocoronary bypass graft: Secondary | ICD-10-CM | POA: Diagnosis not present

## 2017-09-18 DIAGNOSIS — Z7982 Long term (current) use of aspirin: Secondary | ICD-10-CM | POA: Diagnosis not present

## 2017-09-18 DIAGNOSIS — Z01812 Encounter for preprocedural laboratory examination: Secondary | ICD-10-CM | POA: Insufficient documentation

## 2017-09-18 DIAGNOSIS — M5430 Sciatica, unspecified side: Secondary | ICD-10-CM | POA: Insufficient documentation

## 2017-09-18 DIAGNOSIS — Z01818 Encounter for other preprocedural examination: Secondary | ICD-10-CM | POA: Insufficient documentation

## 2017-09-18 DIAGNOSIS — Z9049 Acquired absence of other specified parts of digestive tract: Secondary | ICD-10-CM | POA: Insufficient documentation

## 2017-09-18 DIAGNOSIS — F0390 Unspecified dementia without behavioral disturbance: Secondary | ICD-10-CM | POA: Diagnosis not present

## 2017-09-18 DIAGNOSIS — Z79899 Other long term (current) drug therapy: Secondary | ICD-10-CM | POA: Insufficient documentation

## 2017-09-18 DIAGNOSIS — E785 Hyperlipidemia, unspecified: Secondary | ICD-10-CM | POA: Diagnosis not present

## 2017-09-18 DIAGNOSIS — I2510 Atherosclerotic heart disease of native coronary artery without angina pectoris: Secondary | ICD-10-CM | POA: Diagnosis not present

## 2017-09-18 DIAGNOSIS — H905 Unspecified sensorineural hearing loss: Secondary | ICD-10-CM | POA: Insufficient documentation

## 2017-09-18 HISTORY — DX: Sensorineural hearing loss, bilateral: H90.3

## 2017-09-18 LAB — CBC
HEMATOCRIT: 45.1 % (ref 39.0–52.0)
Hemoglobin: 15.5 g/dL (ref 13.0–17.0)
MCH: 31.6 pg (ref 26.0–34.0)
MCHC: 34.4 g/dL (ref 30.0–36.0)
MCV: 91.9 fL (ref 78.0–100.0)
Platelets: 236 10*3/uL (ref 150–400)
RBC: 4.91 MIL/uL (ref 4.22–5.81)
RDW: 13.6 % (ref 11.5–15.5)
WBC: 12.3 10*3/uL — AB (ref 4.0–10.5)

## 2017-09-18 LAB — BASIC METABOLIC PANEL
ANION GAP: 7 (ref 5–15)
BUN: 19 mg/dL (ref 6–20)
CALCIUM: 9.9 mg/dL (ref 8.9–10.3)
CO2: 27 mmol/L (ref 22–32)
Chloride: 104 mmol/L (ref 101–111)
Creatinine, Ser: 0.98 mg/dL (ref 0.61–1.24)
GLUCOSE: 104 mg/dL — AB (ref 65–99)
POTASSIUM: 4.3 mmol/L (ref 3.5–5.1)
Sodium: 138 mmol/L (ref 135–145)

## 2017-09-18 NOTE — Progress Notes (Signed)
PCP - Dr. Zenia Resides C[facility]  Cardiologist - Dr. Marlou Porch  Chest x-ray - 04/01/17 (E)  EKG - 04/01/17 (E)  Stress Test - Denies  ECHO - 08/07/15 (E)  Cardiac Cath - 07/2015 & 04/02/2017 (E)  Sleep Study - No CPAP - None  Chart will be given to anesthesia for review due to cardiac history and cardiac clearance note in the physical chart.  Pt denies having chest pain, sob, or fever at this time. All instructions explained to the pt, with a verbal understanding of the material. Pt agrees to go over the instructions while at home for a better understanding. The opportunity to ask questions was provided.

## 2017-09-21 NOTE — Progress Notes (Signed)
Anesthesia Chart Review: Patient is a 83 year old male scheduled for left cochlear implant on 09/23/17 by Dr. Vicie Mutters.  History includes CAD/MI '05 s/p PCI and 07/2015 s/p CABG (LIMA-LAD, SVG-distal CX-OM1) 08/07/15, HTN, HLD, dementia, carotid stenosis (mild 1-39% '16), sensorineural hearing loss, sciatica, cholecystectomy, hernia repair (not specified), tonsillectomy, appendectomy.  PCP is Dr. Myriam Jacobson.  Cardiologist is Dr. Candee Furbish. Last visit 08/25/17. Patient noted to have bradycardia, but low dose b-blocker continued as he was not having any adverse effects. He wrote, "He may proceed with cochlear implant with moderate overall cardiac risk based mainly on his age and history of prior MI and CABG. Overall he has been doing quite well. No further cardiac recommendations at this time. Be careful with increased beta blocker use given his first-degree AV block."  Meds include ASA 81 mg (on hold), Aricept, krill oil, levothyroxine, Lopressor 25 mg (1/2 Q AM, 1/4 tab Q PM), pravastatin, tramadol, turmeric.   BP (!) 144/58   Pulse (!) 56   Temp 36.6 C   Resp 20   Ht 5\' 9"  (1.753 m)   Wt 154 lb 9.6 oz (70.1 kg)   SpO2 97%   BMI 22.83 kg/m   EKG 04/02/17: SR with first degree AV block with occasional PVC, possible LAE, cannot rule out inferior infarct (age undetermined).   Cardiac cath 04/02/17:  Ost LM to LM lesion, 75 %stenosed. Ost LAD lesion, 65 %stenosed. LIMA to LAD is patent.  Ost Cx to Prox Cx lesion, 95 %stenosed. Prox Cx lesion, 95 %stenosed. SVG to OM1 and OM2 jump graft is widely patent.  Patent RCA stent.  Ost RPDA lesion, 50 %stenosed.  LV end diastolic pressure is mildly elevated.  There is no aortic valve stenosis.  No coronary lesion that would explain his symptoms.  Continue aggressive preventive therapy.  TEE (intra-op CABG) 08/07/15: Study Conclusions - Left ventricle: The cavity size was normal. There was mild   concentric hypertrophy. Systolic function was  normal. The   estimated ejection fraction was in the range of 55% to 60%. Wall   motion was normal; there were no regional wall motion   abnormalities. - Aortic valve: No evidence of vegetation. - Mitral valve: There was mild regurgitation. - Left atrium: No evidence of thrombus in the appendage. - Right atrium: No evidence of thrombus in the atrial cavity or   appendage. - Atrial septum: No defect or patent foramen ovale was identified.   Echo contrast study showed no right-to-left atrial level shunt,   following an increase in RA pressure induced by provocative   maneuvers. - Tricuspid valve: No evidence of vegetation. - Pulmonic valve: No evidence of vegetation. Impressions: - Normal study.  Carotid US 08/04/15: Summary: Bilateral: mild soft plaque CCA. Mild mixed plaque origin ICA. 1-39% ICA plaquing. Vertebral artery flow is antegrade. ICA/CCA ratio: R- 0.8 L-1.0.   CXR 04/01/17: Impression:  No active disease and no change from priors.   PFTs 08/06/15: FVC 3.89 (107%), FEV1 2.89 (115%), DLCO unc 15.25 (48%).   Preoperative labs noted. Cr 0.98, glucose 104. WBC 12.3, H/H 15.5/45.1, PLT 236.   Patient has had recent cardiology evaluation with surgical clearance. If no acute changes then I anticipate that he can proceed as planned.  George Hugh [facility] Short Stay Center/Anesthesiology Phone [phone] 09/21/2017 1:09 PM

## 2017-09-23 ENCOUNTER — Ambulatory Visit (HOSPITAL_COMMUNITY): Payer: Medicare Other | Admitting: Certified Registered Nurse Anesthetist

## 2017-09-23 ENCOUNTER — Ambulatory Visit (HOSPITAL_COMMUNITY): Payer: Medicare Other | Admitting: Vascular Surgery

## 2017-09-23 ENCOUNTER — Encounter (HOSPITAL_COMMUNITY)
Admission: RE | Disposition: A | Discharge status: Home / Self Care | Payer: Self-pay | Source: Ambulatory Visit | Attending: Otolaryngology

## 2017-09-23 ENCOUNTER — Observation Stay (HOSPITAL_COMMUNITY)
Admission: RE | Admit: 2017-09-23 | Discharge: 2017-09-24 | Disposition: A | Discharge status: Home / Self Care | Payer: Medicare Other | Source: Ambulatory Visit | Attending: Otolaryngology | Admitting: Otolaryngology

## 2017-09-23 ENCOUNTER — Ambulatory Visit (HOSPITAL_COMMUNITY): Payer: Medicare Other

## 2017-09-23 ENCOUNTER — Encounter (HOSPITAL_COMMUNITY): Payer: Self-pay | Admitting: *Deleted

## 2017-09-23 DIAGNOSIS — Z8249 Family history of ischemic heart disease and other diseases of the circulatory system: Secondary | ICD-10-CM | POA: Diagnosis not present

## 2017-09-23 DIAGNOSIS — Z79899 Other long term (current) drug therapy: Secondary | ICD-10-CM | POA: Insufficient documentation

## 2017-09-23 DIAGNOSIS — I252 Old myocardial infarction: Secondary | ICD-10-CM | POA: Insufficient documentation

## 2017-09-23 DIAGNOSIS — E039 Hypothyroidism, unspecified: Secondary | ICD-10-CM | POA: Insufficient documentation

## 2017-09-23 DIAGNOSIS — M5430 Sciatica, unspecified side: Secondary | ICD-10-CM | POA: Diagnosis not present

## 2017-09-23 DIAGNOSIS — Z9049 Acquired absence of other specified parts of digestive tract: Secondary | ICD-10-CM | POA: Diagnosis not present

## 2017-09-23 DIAGNOSIS — Z955 Presence of coronary angioplasty implant and graft: Secondary | ICD-10-CM | POA: Insufficient documentation

## 2017-09-23 DIAGNOSIS — Z820 Family history of epilepsy and other diseases of the nervous system: Secondary | ICD-10-CM | POA: Insufficient documentation

## 2017-09-23 DIAGNOSIS — F0390 Unspecified dementia without behavioral disturbance: Secondary | ICD-10-CM | POA: Diagnosis not present

## 2017-09-23 DIAGNOSIS — I10 Essential (primary) hypertension: Secondary | ICD-10-CM | POA: Insufficient documentation

## 2017-09-23 DIAGNOSIS — Z9621 Cochlear implant status: Secondary | ICD-10-CM | POA: Diagnosis not present

## 2017-09-23 DIAGNOSIS — H903 Sensorineural hearing loss, bilateral: Secondary | ICD-10-CM | POA: Diagnosis not present

## 2017-09-23 DIAGNOSIS — Z9889 Other specified postprocedural states: Secondary | ICD-10-CM | POA: Insufficient documentation

## 2017-09-23 DIAGNOSIS — E785 Hyperlipidemia, unspecified: Secondary | ICD-10-CM | POA: Diagnosis not present

## 2017-09-23 DIAGNOSIS — Z951 Presence of aortocoronary bypass graft: Secondary | ICD-10-CM | POA: Diagnosis not present

## 2017-09-23 DIAGNOSIS — I2510 Atherosclerotic heart disease of native coronary artery without angina pectoris: Secondary | ICD-10-CM | POA: Insufficient documentation

## 2017-09-23 DIAGNOSIS — H9190 Unspecified hearing loss, unspecified ear: Secondary | ICD-10-CM

## 2017-09-23 HISTORY — PX: COCHLEAR IMPLANT: SHX184

## 2017-09-23 LAB — CREATININE, SERUM
Creatinine, Ser: 0.94 mg/dL (ref 0.61–1.24)
GFR calc non Af Amer: >60 mL/min (ref >60)

## 2017-09-23 LAB — CBC
HCT: 40.4 % (ref 39.0–52.0)
HEMOGLOBIN: 13.7 g/dL (ref 13.0–17.0)
MCH: 31.2 pg (ref 26.0–34.0)
MCHC: 33.9 g/dL (ref 30.0–36.0)
MCV: 92 fL (ref 78.0–100.0)
Platelets: 218 10*3/uL (ref 150–400)
RBC: 4.39 MIL/uL (ref 4.22–5.81)
RDW: 13.4 % (ref 11.5–15.5)
WBC: 13.4 10*3/uL — AB (ref 4.0–10.5)

## 2017-09-23 SURGERY — INSERTION, IMPLANT, COCHLEAR
Anesthesia: General | Site: Ear | Laterality: Left

## 2017-09-23 MED ORDER — METOPROLOL TARTRATE 25 MG PO TABS
25.0000 mg | ORAL_TABLET | Freq: Once | ORAL | Status: AC
  Administered 2017-09-23: 25 mg via ORAL
  Filled 2017-09-23: qty 1

## 2017-09-23 MED ORDER — ESMOLOL HCL 100 MG/10ML IV SOLN
INTRAVENOUS | Status: DC | PRN
  Administered 2017-09-23: 20 mg via INTRAVENOUS

## 2017-09-23 MED ORDER — ONDANSETRON HCL 4 MG/2ML IJ SOLN: 4.0000 mg | Freq: Once | INTRAMUSCULAR | Status: DC | PRN

## 2017-09-23 MED ORDER — FENTANYL CITRATE (PF) 250 MCG/5ML IJ SOLN
INTRAMUSCULAR | Status: DC | PRN
  Administered 2017-09-23: 50 ug via INTRAVENOUS
  Administered 2017-09-23 (×2): 25 ug via INTRAVENOUS
  Administered 2017-09-23: 50 ug via INTRAVENOUS
  Administered 2017-09-23: 100 ug via INTRAVENOUS
  Administered 2017-09-23: 25 ug via INTRAVENOUS
  Administered 2017-09-23: 50 ug via INTRAVENOUS

## 2017-09-23 MED ORDER — BACITRACIN ZINC 500 UNIT/GM EX OINT
TOPICAL_OINTMENT | CUTANEOUS | Status: DC | PRN
  Administered 2017-09-23: 1 via TOPICAL

## 2017-09-23 MED ORDER — ONDANSETRON HCL 4 MG PO TABS: 4.0000 mg | ORAL_TABLET | ORAL | Status: DC | PRN

## 2017-09-23 MED ORDER — METHYLENE BLUE 0.5 % INJ SOLN
INTRAVENOUS | Status: DC | PRN
  Administered 2017-09-23: 10 mL via SUBMUCOSAL

## 2017-09-23 MED ORDER — IBUPROFEN 100 MG/5ML PO SUSP
400.0000 mg | Freq: Four times a day (QID) | ORAL | Status: DC | PRN
  Administered 2017-09-23: 400 mg via ORAL
  Filled 2017-09-23 (×2): qty 20

## 2017-09-23 MED ORDER — LIDOCAINE 2% (20 MG/ML) 5 ML SYRINGE
INTRAMUSCULAR | Status: DC | PRN
  Administered 2017-09-23: 60 mg via INTRAVENOUS

## 2017-09-23 MED ORDER — DEXAMETHASONE SODIUM PHOSPHATE 10 MG/ML IJ SOLN
INTRAMUSCULAR | Status: DC | PRN
  Administered 2017-09-23: 10 mg via INTRAVENOUS

## 2017-09-23 MED ORDER — LIDOCAINE-EPINEPHRINE 1 %-1:100000 IJ SOLN
INTRAMUSCULAR | Status: DC | PRN
  Administered 2017-09-23: 7.5 mL

## 2017-09-23 MED ORDER — ONDANSETRON HCL 4 MG/2ML IJ SOLN
INTRAMUSCULAR | Status: AC
  Filled 2017-09-23: qty 2

## 2017-09-23 MED ORDER — DEXAMETHASONE SODIUM PHOSPHATE 10 MG/ML IJ SOLN
INTRAMUSCULAR | Status: DC | PRN
  Administered 2017-09-23 (×2): 10 mg

## 2017-09-23 MED ORDER — FENTANYL CITRATE (PF) 250 MCG/5ML IJ SOLN
INTRAMUSCULAR | Status: AC
  Filled 2017-09-23: qty 5

## 2017-09-23 MED ORDER — FENTANYL CITRATE (PF) 100 MCG/2ML IJ SOLN
INTRAMUSCULAR | Status: AC
  Filled 2017-09-23: qty 2

## 2017-09-23 MED ORDER — DEXMEDETOMIDINE HCL 200 MCG/2ML IV SOLN
INTRAVENOUS | Status: DC | PRN
Start: 2017-09-23 — End: 2017-09-23
  Administered 2017-09-23 (×2): 4 ug via INTRAVENOUS

## 2017-09-23 MED ORDER — DEXAMETHASONE SODIUM PHOSPHATE 10 MG/ML IJ SOLN
INTRAMUSCULAR | Status: AC
  Filled 2017-09-23: qty 1

## 2017-09-23 MED ORDER — PROPOFOL 10 MG/ML IV BOLUS
INTRAVENOUS | Status: AC
  Filled 2017-09-23: qty 20

## 2017-09-23 MED ORDER — SUCCINYLCHOLINE CHLORIDE 20 MG/ML IJ SOLN
INTRAMUSCULAR | Status: DC | PRN
  Administered 2017-09-23: 60 mg via INTRAVENOUS

## 2017-09-23 MED ORDER — BACITRACIN ZINC 500 UNIT/GM EX OINT
TOPICAL_OINTMENT | CUTANEOUS | Status: AC
  Filled 2017-09-23: qty 28.35

## 2017-09-23 MED ORDER — ONDANSETRON HCL 4 MG/2ML IJ SOLN: 4.0000 mg | INTRAMUSCULAR | Status: DC | PRN

## 2017-09-23 MED ORDER — EPINEPHRINE HCL (NASAL) 0.1 % NA SOLN
NASAL | Status: DC | PRN
  Administered 2017-09-23: 30 mL via TOPICAL

## 2017-09-23 MED ORDER — DEXMEDETOMIDINE HCL IN NACL 200 MCG/50ML IV SOLN
INTRAVENOUS | Status: AC
  Filled 2017-09-23: qty 50

## 2017-09-23 MED ORDER — RISAQUAD PO CAPS
1.0000 | ORAL_CAPSULE | Freq: Every day | ORAL | Status: DC
  Administered 2017-09-23: 1 via ORAL
  Filled 2017-09-23: qty 1

## 2017-09-23 MED ORDER — TURMERIC 500 MG PO CAPS: 500.0000 mg | ORAL_CAPSULE | Freq: Every day | ORAL | Status: DC

## 2017-09-23 MED ORDER — EPINEPHRINE HCL (NASAL) 0.1 % NA SOLN
NASAL | Status: AC
  Filled 2017-09-23: qty 30

## 2017-09-23 MED ORDER — PROPOFOL 10 MG/ML IV BOLUS
INTRAVENOUS | Status: DC | PRN
  Administered 2017-09-23: 170 mg via INTRAVENOUS

## 2017-09-23 MED ORDER — HEPARIN SODIUM (PORCINE) 5000 UNIT/ML IJ SOLN: 5000.0000 [IU] | Freq: Three times a day (TID) | INTRAMUSCULAR | Status: DC

## 2017-09-23 MED ORDER — PHENYLEPHRINE HCL 10 MG/ML IJ SOLN
INTRAVENOUS | Status: DC | PRN
  Administered 2017-09-23: 25 ug/min via INTRAVENOUS

## 2017-09-23 MED ORDER — DEXTROSE IN LACTATED RINGERS 5 % IV SOLN
INTRAVENOUS | Status: DC
  Administered 2017-09-23 – 2017-09-24 (×2): via INTRAVENOUS

## 2017-09-23 MED ORDER — LISINOPRIL 5 MG PO TABS: 5.0000 mg | ORAL_TABLET | Freq: Every day | ORAL | Status: DC

## 2017-09-23 MED ORDER — TRAMADOL HCL 50 MG PO TABS: 50.0000 mg | ORAL_TABLET | Freq: Four times a day (QID) | ORAL | Status: DC | PRN

## 2017-09-23 MED ORDER — LIDOCAINE 2% (20 MG/ML) 5 ML SYRINGE
INTRAMUSCULAR | Status: AC
  Filled 2017-09-23: qty 10

## 2017-09-23 MED ORDER — EPHEDRINE 5 MG/ML INJ
INTRAVENOUS | Status: AC
  Filled 2017-09-23: qty 10

## 2017-09-23 MED ORDER — SODIUM CHLORIDE 0.9 % IR SOLN
Status: DC | PRN
  Administered 2017-09-23 (×4): 1000 mL

## 2017-09-23 MED ORDER — LEVOTHYROXINE SODIUM 50 MCG PO TABS
50.0000 ug | ORAL_TABLET | Freq: Every day | ORAL | Status: DC
  Filled 2017-09-23: qty 1

## 2017-09-23 MED ORDER — 0.9 % SODIUM CHLORIDE (POUR BTL) OPTIME
TOPICAL | Status: DC | PRN
  Administered 2017-09-23: 1000 mL

## 2017-09-23 MED ORDER — SODIUM CHLORIDE 0.9 % IV SOLN
INTRAVENOUS | Status: DC | PRN
  Administered 2017-09-23: 500 mL

## 2017-09-23 MED ORDER — GLYCOPYRROLATE 0.2 MG/ML IJ SOLN
INTRAMUSCULAR | Status: DC | PRN
  Administered 2017-09-23 (×2): 0.2 mg via INTRAVENOUS

## 2017-09-23 MED ORDER — MORPHINE SULFATE (PF) 4 MG/ML IV SOLN: 1.0000 mg | INTRAVENOUS | Status: DC | PRN

## 2017-09-23 MED ORDER — SUCCINYLCHOLINE CHLORIDE 200 MG/10ML IV SOSY
PREFILLED_SYRINGE | INTRAVENOUS | Status: AC
  Filled 2017-09-23: qty 10

## 2017-09-23 MED ORDER — CIPROFLOXACIN-DEXAMETHASONE 0.3-0.1 % OT SUSP
OTIC | Status: DC | PRN
  Administered 2017-09-23: 1 [drp] via OTIC

## 2017-09-23 MED ORDER — FLORA-Q PO CAPS
ORAL_CAPSULE | Freq: Every day | ORAL | Status: DC
  Filled 2017-09-23: qty 1

## 2017-09-23 MED ORDER — FENTANYL CITRATE (PF) 100 MCG/2ML IJ SOLN
25.0000 ug | INTRAMUSCULAR | Status: DC | PRN
  Administered 2017-09-23: 25 ug via INTRAVENOUS

## 2017-09-23 MED ORDER — DEXAMETHASONE SODIUM PHOSPHATE 4 MG/ML IJ SOLN: 10.0000 mg | INTRAMUSCULAR | Status: DC

## 2017-09-23 MED ORDER — CIPROFLOXACIN-DEXAMETHASONE 0.3-0.1 % OT SUSP
OTIC | Status: AC
  Filled 2017-09-23: qty 7.5

## 2017-09-23 MED ORDER — LIDOCAINE-EPINEPHRINE 1 %-1:100000 IJ SOLN
INTRAMUSCULAR | Status: AC
  Filled 2017-09-23: qty 1

## 2017-09-23 MED ORDER — EPHEDRINE SULFATE 50 MG/ML IJ SOLN
INTRAMUSCULAR | Status: DC | PRN
  Administered 2017-09-23 (×3): 5 mg via INTRAVENOUS

## 2017-09-23 MED ORDER — DEXAMETHASONE SODIUM PHOSPHATE 10 MG/ML IJ SOLN
8.0000 mg | Freq: Four times a day (QID) | INTRAMUSCULAR | Status: AC
  Administered 2017-09-23 (×2): 8 mg via INTRAVENOUS
  Filled 2017-09-23 (×2): qty 1

## 2017-09-23 MED ORDER — CEFAZOLIN SODIUM-DEXTROSE 1-4 GM/50ML-% IV SOLN
1.0000 g | Freq: Four times a day (QID) | INTRAVENOUS | Status: AC
  Administered 2017-09-23 – 2017-09-24 (×3): 1 g via INTRAVENOUS
  Filled 2017-09-23 (×3): qty 50

## 2017-09-23 MED ORDER — PRAVASTATIN SODIUM 40 MG PO TABS
40.0000 mg | ORAL_TABLET | Freq: Every evening | ORAL | Status: DC
  Administered 2017-09-23: 40 mg via ORAL
  Filled 2017-09-23: qty 1

## 2017-09-23 MED ORDER — BACITRACIN ZINC 500 UNIT/GM EX OINT
1.0000 "application " | TOPICAL_OINTMENT | Freq: Three times a day (TID) | CUTANEOUS | Status: DC
  Administered 2017-09-23 – 2017-09-24 (×2): 1 via TOPICAL
  Filled 2017-09-23: qty 28.35

## 2017-09-23 MED ORDER — HEPARIN SODIUM (PORCINE) 5000 UNIT/ML IJ SOLN
5000.0000 [IU] | Freq: Three times a day (TID) | INTRAMUSCULAR | Status: DC
  Administered 2017-09-23 – 2017-09-24 (×2): 5000 [IU] via SUBCUTANEOUS
  Filled 2017-09-23 (×2): qty 1

## 2017-09-23 MED ORDER — ROCURONIUM BROMIDE 10 MG/ML (PF) SYRINGE
PREFILLED_SYRINGE | INTRAVENOUS | Status: AC
  Filled 2017-09-23: qty 5

## 2017-09-23 MED ORDER — DIAZEPAM 2 MG PO TABS: 2.0000 mg | ORAL_TABLET | Freq: Three times a day (TID) | ORAL | Status: DC | PRN

## 2017-09-23 MED ORDER — POLYETHYLENE GLYCOL 3350 17 G PO PACK: 17.0000 g | PACK | Freq: Every day | ORAL | Status: DC | PRN

## 2017-09-23 MED ORDER — MUPIROCIN 2 % EX OINT
1.0000 "application " | TOPICAL_OINTMENT | Freq: Three times a day (TID) | CUTANEOUS | Status: DC
  Administered 2017-09-23 – 2017-09-24 (×2): 1 via TOPICAL
  Filled 2017-09-23: qty 308

## 2017-09-23 MED ORDER — HEMOSTATIC AGENTS (NO CHARGE) OPTIME
TOPICAL | Status: DC | PRN
  Administered 2017-09-23 (×3): 1 via TOPICAL

## 2017-09-23 MED ORDER — STERILE WATER FOR IRRIGATION IR SOLN
Status: DC | PRN
  Administered 2017-09-23 (×2): 1000 mL

## 2017-09-23 MED ORDER — CEFAZOLIN SODIUM-DEXTROSE 2-4 GM/100ML-% IV SOLN
2.0000 g | INTRAVENOUS | Status: AC
  Administered 2017-09-23: 2 g via INTRAVENOUS

## 2017-09-23 MED ORDER — ZOLPIDEM TARTRATE 5 MG PO TABS: 5.0000 mg | ORAL_TABLET | Freq: Every evening | ORAL | Status: DC | PRN

## 2017-09-23 MED ORDER — METHYLENE BLUE 0.5 % INJ SOLN
INTRAVENOUS | Status: AC
  Filled 2017-09-23: qty 10

## 2017-09-23 MED ORDER — DEXAMETHASONE SODIUM PHOSPHATE 4 MG/ML IJ SOLN
INTRAMUSCULAR | Status: AC
  Filled 2017-09-23: qty 1

## 2017-09-23 MED ORDER — HYDROCODONE-ACETAMINOPHEN 5-325 MG PO TABS: 1.0000 | ORAL_TABLET | ORAL | Status: DC | PRN

## 2017-09-23 MED ORDER — DONEPEZIL HCL 10 MG PO TABS: 10.0000 mg | ORAL_TABLET | Freq: Every day | ORAL | Status: DC

## 2017-09-23 MED ORDER — CEFAZOLIN SODIUM-DEXTROSE 2-4 GM/100ML-% IV SOLN
INTRAVENOUS | Status: AC
  Filled 2017-09-23: qty 100

## 2017-09-23 MED ORDER — LACTATED RINGERS IV SOLN
INTRAVENOUS | Status: DC | PRN
  Administered 2017-09-23 (×2): via INTRAVENOUS

## 2017-09-23 MED ORDER — ONDANSETRON HCL 4 MG/2ML IJ SOLN: 4.0000 mg | INTRAMUSCULAR | Status: DC

## 2017-09-23 SURGICAL SUPPLY — 93 items
ATTRACTOMAT 16X20 MAGNETIC DRP (DRAPES) IMPLANT
BAG DECANTER FOR FLEXI CONT (MISCELLANEOUS) ×3 IMPLANT
BENZOIN TINCTURE PRP APPL 2/3 (GAUZE/BANDAGES/DRESSINGS) ×3 IMPLANT
BLADE CLIPPER SURG (BLADE) ×3 IMPLANT
BLADE NEEDLE 3 SS STRL (BLADE) ×2 IMPLANT
BLADE NEEDLE 3MM SS STRL (BLADE) ×1
BNDG CONFORM 3 STRL LF (GAUZE/BANDAGES/DRESSINGS) IMPLANT
BUR 7 SOFT (BURR) IMPLANT
BUR 7MM SOFT (BURR)
BUR ACRON 5.0MM COATED (BURR) ×3 IMPLANT
BUR DIAMOND 1.0 (BURR) ×2 IMPLANT
BUR DIAMOND 1.0MM (BURR) ×1
BUR NEURO DRILL SOFT 3.0X3.8M (BURR) ×3 IMPLANT
BUR ROUND FLUTED 5 RND (BURR) IMPLANT
BUR ROUND FLUTED 5MM RND (BURR)
BUR SABER DIAMOND 3.0 (BURR) ×3 IMPLANT
BUR SABER DIAMOND 5.0 (BURR) IMPLANT
BUR SABER RD CUTTING 3.0 (BURR) ×2 IMPLANT
BUR SABER RD CUTTING 3.0MM (BURR) ×1
BUR SABER TAPERED DIAMOND 1 (BURR) ×3 IMPLANT
BUR SABER TAPERED DIAMOND 2 (BURR) IMPLANT
BUR SABER TAPERED RD 1.0 (BURR) IMPLANT
BUR SABER TAPERED RD 1.0MM (BURR)
BUR STRYKER TAPERED RND 2.0M (BURR) ×3 IMPLANT
CANISTER SUCT 3000ML PPV (MISCELLANEOUS) ×3 IMPLANT
CLEANER TIP ELECTROSURG 2X2 (MISCELLANEOUS) ×3 IMPLANT
CLOSURE WOUND 1/2 X4 (GAUZE/BANDAGES/DRESSINGS) ×1
CORDS BIPOLAR (ELECTRODE) IMPLANT
COTTONBALL LRG STERILE PKG (GAUZE/BANDAGES/DRESSINGS) ×3 IMPLANT
COVER MAYO STAND STRL (DRAPES) ×3 IMPLANT
COVER SURGICAL LIGHT HANDLE (MISCELLANEOUS) ×3 IMPLANT
DRAIN JACKSON RD 7FR 3/32 (WOUND CARE) IMPLANT
DRAIN SNY WOU TUB LG (WOUND CARE) IMPLANT
DRAPE HALF SHEET 40X57 (DRAPES) ×3 IMPLANT
DRAPE INCISE 23X17 IOBAN STRL (DRAPES) ×2
DRAPE INCISE IOBAN 23X17 STRL (DRAPES) ×1 IMPLANT
DRAPE INCISE IOBAN 66X45 STRL (DRAPES) ×3 IMPLANT
DRAPE MICROSCOPE LEICA 54X105 (DRAPE) ×3 IMPLANT
DRAPE POUCH INSTRU U-SHP 10X18 (DRAPES) ×3 IMPLANT
DRAPE U-SHAPE 47X51 STRL (DRAPES) ×3 IMPLANT
DRSG EMULSION OIL 3X3 NADH (GAUZE/BANDAGES/DRESSINGS) IMPLANT
DRSG GLASSCOCK MASTOID ADT (GAUZE/BANDAGES/DRESSINGS) ×3 IMPLANT
ELECT COATED BLADE 2.86 ST (ELECTRODE) ×3 IMPLANT
ELECT PAIRED SUBDERMAL (MISCELLANEOUS) ×3
ELECT REM PT RETURN 9FT ADLT (ELECTROSURGICAL) ×3
ELECTRODE PAIRED SUBDERMAL (MISCELLANEOUS) ×1 IMPLANT
ELECTRODE REM PT RTRN 9FT ADLT (ELECTROSURGICAL) ×1 IMPLANT
EVACUATOR SILICONE 100CC (DRAIN) IMPLANT
GAUZE SPONGE 4X4 12PLY STRL (GAUZE/BANDAGES/DRESSINGS) ×3 IMPLANT
GAUZE SPONGE 4X4 16PLY XRAY LF (GAUZE/BANDAGES/DRESSINGS) ×3 IMPLANT
GLOVE ECLIPSE 7.5 STRL STRAW (GLOVE) ×6 IMPLANT
GOWN STRL REUS W/ TWL LRG LVL3 (GOWN DISPOSABLE) ×2 IMPLANT
GOWN STRL REUS W/TWL LRG LVL3 (GOWN DISPOSABLE) ×4
IMPL COCHLEAR ULTRA 3D (Prosthesis and Implant ENT) ×1 IMPLANT
IMPLANT COCHLEAR ULTRA 3D (Prosthesis and Implant ENT) ×3 IMPLANT
KIT BASIN OR (CUSTOM PROCEDURE TRAY) ×3 IMPLANT
KIT ROOM TURNOVER OR (KITS) ×3 IMPLANT
MARKER SKIN DUAL TIP RULER LAB (MISCELLANEOUS) ×3 IMPLANT
NEEDLE 18GX1X1/2 (RX/OR ONLY) (NEEDLE) ×6 IMPLANT
NEEDLE HYPO 25GX1X1/2 BEV (NEEDLE) ×6 IMPLANT
NS IRRIG 1000ML POUR BTL (IV SOLUTION) ×3 IMPLANT
PAD ARMBOARD 7.5X6 YLW CONV (MISCELLANEOUS) ×6 IMPLANT
PATTIES SURGICAL .25X.25 (GAUZE/BANDAGES/DRESSINGS) ×3 IMPLANT
PATTIES SURGICAL 1X1 (DISPOSABLE) IMPLANT
PENCIL BUTTON HOLSTER BLD 10FT (ELECTRODE) ×3 IMPLANT
PROBE NERVBE PRASS .33 (MISCELLANEOUS) ×3 IMPLANT
PUNCH BIOPSY 4MM (MISCELLANEOUS) ×3
PUNCH BIOPSY DERMAL 6MM STRL (MISCELLANEOUS) ×3 IMPLANT
PUNCH BIOPSY DISP 4 (MISCELLANEOUS) ×1 IMPLANT
SPONGE NEURO XRAY DETECT 1X3 (DISPOSABLE) ×3 IMPLANT
SPONGE SURGIFOAM ABS GEL 12-7 (HEMOSTASIS) ×9 IMPLANT
STAPLER VISISTAT 35W (STAPLE) ×3 IMPLANT
STRIP CLOSURE SKIN 1/2X4 (GAUZE/BANDAGES/DRESSINGS) ×2 IMPLANT
SUT BONE WAX W31G (SUTURE) ×3 IMPLANT
SUT CHROMIC 3 0 SH 27 (SUTURE) ×3 IMPLANT
SUT ETHILON 5 0 PS 2 18 (SUTURE) ×3 IMPLANT
SUT MNCRL AB 3-0 PS2 18 (SUTURE) ×9 IMPLANT
SUT NOVAFIL 5 0 BLK 18 IN P13 (SUTURE) ×3 IMPLANT
SUT NURALON 5 0 PC 1 (SUTURE) ×3 IMPLANT
SUT SILK 2 0 PERMA HAND 18 BK (SUTURE) IMPLANT
SUT SILK 4 0 CR 8 RB 1 (SUTURE) ×3 IMPLANT
SYR 3ML LL SCALE MARK (SYRINGE) ×3 IMPLANT
SYR BULB 3OZ (MISCELLANEOUS) ×3 IMPLANT
SYR CONTROL 10ML LL (SYRINGE) ×3 IMPLANT
TOWEL OR 17X24 6PK STRL BLUE (TOWEL DISPOSABLE) ×3 IMPLANT
TOWEL OR 17X26 10 PK STRL BLUE (TOWEL DISPOSABLE) ×3 IMPLANT
TRAY ENT MC OR (CUSTOM PROCEDURE TRAY) ×3 IMPLANT
TRAY FOLEY W/METER SILVER 16FR (SET/KITS/TRAYS/PACK) IMPLANT
TUBING CYSTO DISP (UROLOGICAL SUPPLIES) ×3 IMPLANT
TUBING EXTENTION W/L.L. (IV SETS) ×3 IMPLANT
TUBING IRRIGATION (MISCELLANEOUS) ×3 IMPLANT
WATER STERILE IRR 1000ML POUR (IV SOLUTION) ×6 IMPLANT
WIPE INSTRUMENT VISIWIPE 73X73 (MISCELLANEOUS) ×3 IMPLANT

## 2017-09-23 NOTE — Progress Notes (Signed)
[place] patient from PACU. Alert, oriented x4. Dressing to the left ear is clean, dry, and intact. Patient is accompanied by wife.

## 2017-09-23 NOTE — Brief Op Note (Signed)
09/23/2017  11:41 AM  PATIENT:  Isaac [name]  83 y.o. male  PRE-OPERATIVE DIAGNOSIS:  BILATERAL MODERATELY SEVERE TO SEVERE SENSORINEURAL HEARING LOSS H90.3, BEYOUND HEARING AIDS  POST-OPERATIVE DIAGNOSIS:   BILATERAL MODERATELY SEVERE TO SEVERE SENSORINEURAL HEARING LOSS H90.3, BEYOUND HEARING AIDS   PROCEDURE:  Procedure(s): COCHLEAR IMPLANT (Left)  - [employer]ion[employer] HiRes Ultra 3D CI with HiFocus mid-scalar electrode, full length insertion, SN [id number] SURGEON:  Surgeon(s) and Role:    Vicie Mutters, MD - Primary  PHYSICIAN ASSISTANT: none  ASSISTANTS: none   ANESTHESIA:   general  EBL:  25 mL   BLOOD ADMINISTERED:none  DRAINS: none   LOCAL MEDICATIONS USED:  XYLOCAINE 7 ml SPECIMEN:  No Specimen  DISPOSITION OF SPECIMEN:  N/A  COUNTS:  YES  TOURNIQUET:  * No tourniquets in log *  DICTATION: .Other Dictation: Dictation Number [id number]  PLAN OF CARE: Admit for overnight observation  PATIENT DISPOSITION:  PACU - hemodynamically stable.   Delay start of Pharmacological VTE agent (>24hrs) due to surgical blood loss or risk of bleeding: not applicable

## 2017-09-23 NOTE — Interval H&P Note (Signed)
History and Physical Interval Note:  09/23/2017 7:11 AM  Isaac [name]  has presented today for operation, with the diagnosis of moderately severe to severe sensorineural hearing loss H90.3, beyond hearing aids.  The various methods of treatment have been discussed with the patient and his wife. After consideration of risks, benefits and other options for treatment, the patient has consented to  Procedure(s): COCHLEAR IMPLANT (Left) with an [employer]ion[employer] HiRes Ultra 3D cochlear implant with mid-scalar electrode as a surgical intervention .  The patient's history has been reviewed, patient examined, no change in status, stable for surgery.  I have reviewed the patient's chart and labs. Risks, complications, and alternatives of the procedure have been explained to the patient and to his wife.  Questions have been invited and answered to the patient's satisfaction.  He has read and signed our written cochlear implant informed consent. He does not report any fever, cough or upper/lower respiratory tract symptoms during the last 3 days.  Thornell Mule, Kadasia Kassing M

## 2017-09-23 NOTE — Anesthesia Postprocedure Evaluation (Signed)
Anesthesia Post Note  Patient: Isaac [name]  Procedure(s) Performed: COCHLEAR IMPLANT (Left Ear)     Patient location during evaluation: PACU Anesthesia Type: General Level of consciousness: awake and alert Pain management: pain level controlled Vital Signs Assessment: post-procedure vital signs reviewed and stable Respiratory status: spontaneous breathing, nonlabored ventilation, respiratory function stable and patient connected to nasal cannula oxygen Cardiovascular status: blood pressure returned to baseline and stable Postop Assessment: no apparent nausea or vomiting Anesthetic complications: no    Last Vitals:  Vitals:   09/23/17 1351 09/23/17 1417  BP:  118/63  Pulse: 99 97  Resp:  16  Temp:  36.8 C  SpO2:  92%    Last Pain:  Vitals:   09/23/17 1417  TempSrc: Oral  PainSc:                  Merrell Rettinger P Marcelene Weidemann

## 2017-09-23 NOTE — Transfer of Care (Signed)
Immediate Anesthesia Transfer of Care Note  Patient: Isaac [name]  Procedure(s) Performed: COCHLEAR IMPLANT (Left Ear)  Patient Location: PACU  Anesthesia Type:General  Level of Consciousness: drowsy  Airway & Oxygen Therapy: Patient Spontanous Breathing and Patient connected to face mask oxygen  Post-op Assessment: Report given to RN and Post -op Vital signs reviewed and stable  Post vital signs: Reviewed and stable  Last Vitals:  Vitals:   09/23/17 0606  BP: (!) 171/61  Pulse: 61  Resp: 20  Temp: 36.6 C  SpO2: 97%    Last Pain:  Vitals:   09/23/17 0606  TempSrc: Oral      Patients Stated Pain Goal: 3 (49/82/64 1583)  Complications: No apparent anesthesia complications

## 2017-09-23 NOTE — Anesthesia Procedure Notes (Addendum)
Procedure Name: Intubation Date/Time: 09/23/2017 7:47 AM Performed by: Bryson Corona Pre-anesthesia Checklist: Patient identified, Emergency Drugs available, Suction available and Patient being monitored Patient Re-evaluated:Patient Re-evaluated prior to induction Oxygen Delivery Method: Circle System Utilized Preoxygenation: Pre-oxygenation with 100% oxygen Induction Type: IV induction Ventilation: Mask ventilation without difficulty Laryngoscope Size: Mac and 3 Grade View: Grade I Tube type: Oral Tube size: 7.0 mm Number of attempts: 1 Airway Equipment and Method: Stylet and Oral airway Placement Confirmation: ETT inserted through vocal cords under direct vision,  positive ETCO2 and breath sounds checked- equal and bilateral Secured at: 22 cm Tube secured with: Tape Dental Injury: Teeth and Oropharynx as per pre-operative assessment

## 2017-09-23 NOTE — Anesthesia Preprocedure Evaluation (Signed)
Anesthesia Evaluation  Patient identified by MRN, date of birth, ID band Patient awake    Reviewed: Allergy & Precautions, NPO status , Patient's Chart, lab work & pertinent test results, reviewed documented beta blocker date and time   History of Anesthesia Complications Negative for: history of anesthetic complications  Airway Mallampati: II  TM Distance: >3 FB Neck ROM: Full    Dental no notable dental hx. (+) Teeth Intact, Dental Advisory Given   Pulmonary neg pulmonary ROS,    Pulmonary exam normal breath sounds clear to auscultation       Cardiovascular Exercise Tolerance: Good hypertension, Pt. on medications and Pt. on home beta blockers (-) angina+ CAD and + Past MI  Normal cardiovascular exam Rhythm:Regular Rate:Normal  ECG: SR, rate 57  ECHO: LV EF: 55% -   60%  Pre-op eval per cardiologist, Dr. Candee Furbish   Neuro/Psych Dementia  Sensorineural hearing loss (SNHL), bilateral negative psych ROS   GI/Hepatic negative GI ROS, Neg liver ROS,   Endo/Other  Hypothyroidism   Renal/GU negative Renal ROS     Musculoskeletal negative musculoskeletal ROS (+)   Abdominal   Peds  Hematology negative hematology ROS (+)   Anesthesia Other Findings Day of surgery medications reviewed with the patient. HLD  Reproductive/Obstetrics                             Anesthesia Physical  Anesthesia Plan  ASA: III  Anesthesia Plan: General   Post-op Pain Management:    Induction: Intravenous  PONV Risk Score and Plan: 2 and Ondansetron and Dexamethasone  Airway Management Planned: Oral ETT  Additional Equipment:   Intra-op Plan:   Post-operative Plan: Extubation in OR  Informed Consent: I have reviewed the patients History and Physical, chart, labs and discussed the procedure including the risks, benefits and alternatives for the proposed anesthesia with the patient or authorized  representative who has indicated his/her understanding and acceptance.   Dental advisory given  Plan Discussed with: CRNA  Anesthesia Plan Comments:         Anesthesia Quick Evaluation Cath 08/03/15:  Mid RCA to Dist RCA lesion, 15% stenosed. The lesion was previously treated with a stent (unknown type) greater than two years ago.  Prox LAD lesion, 30% stenosed.  Ost 1st Diag to 1st Diag lesion, 99% stenosed.  Ost Cx to Prox Cx lesion, 95% stenosed.  Prox Cx lesion, 95% stenosed.  There is severe left ventricular systolic dysfunction.  Ost LAD lesion, 65% stenosed.  1. Severe 2 vessel obstructive CAD  -Ostial LAD stenosis is moderate with significantly abnormal FFR of 0.75  -Critical proximal LCx stenosis with tandem lesions involving take off of large OM branch  - patent stent in mid RCA  2. Normal LV function. EF 55-65%

## 2017-09-23 NOTE — Progress Notes (Signed)
S: Awake, alert, Sitting up in bed, IV in place, does not report any otalgia, does report being unable to void.      Voided earlier when arrived on 6N. Has told nurses that he needs to have bowel movement. No vertigo.      Wife not in room.  O: VS stable, VIIth n function intact, no nystagmus, dressing dry      WBC elevated due to steroid effect       A: 1. Stable post operative course with exception of not being able to void since arrived on 6N 2. IV ok 3. Incision stable, dressing dry 4. Facial function intact  P: 1. Nurses will insert foley catheter and DC at 6am on 09-24-17 2. Continue IVF and antibiotics. 3. Plan DC in am with wife if stable.

## 2017-09-24 DIAGNOSIS — Z955 Presence of coronary angioplasty implant and graft: Secondary | ICD-10-CM | POA: Diagnosis not present

## 2017-09-24 DIAGNOSIS — Z79899 Other long term (current) drug therapy: Secondary | ICD-10-CM | POA: Diagnosis not present

## 2017-09-24 DIAGNOSIS — Z9889 Other specified postprocedural states: Secondary | ICD-10-CM | POA: Diagnosis not present

## 2017-09-24 DIAGNOSIS — H903 Sensorineural hearing loss, bilateral: Secondary | ICD-10-CM | POA: Diagnosis not present

## 2017-09-24 DIAGNOSIS — I2510 Atherosclerotic heart disease of native coronary artery without angina pectoris: Secondary | ICD-10-CM | POA: Diagnosis not present

## 2017-09-24 DIAGNOSIS — Z951 Presence of aortocoronary bypass graft: Secondary | ICD-10-CM | POA: Diagnosis not present

## 2017-09-24 MED ORDER — MUPIROCIN 2 % EX OINT: 1.0000 "application " | TOPICAL_OINTMENT | Freq: Three times a day (TID) | CUTANEOUS | 0 refills | Status: DC

## 2017-09-24 NOTE — Discharge Instructions (Signed)
1. DC this morning with wife. 2. Return to office on 10-06-17 at 2:45 pm. Given written instructions and prescriptions as well as copy on intraoperative C-arm xray of implant 3. DC diet: regular 4. DC medications:      A. Cefzil 500mg  PO BID x 10 days     B. Vicodin 5/300 1 PO Q4hrs. Prn pain for 3 days (#10 tabs)     C. Ibuprofen or tylenol q4 hrs prn pain.     D. Mupirocin ointment - apply to incision BID x 1 wk. 5. DC activity: Quiet indoor activity. Avoid swimming or water exposure for 1 wk. Avoid heavy lifting > 20 lbs. 6. DC status: stable 7. Please call Dr. Thornell Mule' office at [phone]  If you have any questions or problems directly related to your procedure.

## 2017-09-24 NOTE — Progress Notes (Signed)
S: Quiet night, no complaints, foley catheter removed at 6 am without difficulty, no vertigo reported, does not complain of pain  O: VS stable, VIIth function intact, no nystagmus, dressing removed, incision cleaned, no hematoma, mupirocin ointment applied  A: 1. Stable postoperative course without complications 2. Ok for DC today with wife  P: 1. DC this morning with wife. 2. Return to office on 10-06-17 at 2:45 pm. Given written instructions and prescriptions as well as copy on intraoperative C-arm xray of implant 3. DC diet: regular 4. DC medications:      A. Cefzil 500mg  PO BID x 10 days     B. Vicodin 5/300 1 PO Q4hrs. Prn pain for 3 days (#10 tabs)     C. Ibuprofen or tylenol q4 hrs prn pain.     D. Mupirocin ointment - apply to incision BID x 1 wk. 5. DC activity: Quiet indoor activity. Avoid swimming or water exposure for 1 wk. Avoid heavy lifting > 20 lbs. 6. DC status: stable

## 2017-09-24 NOTE — Progress Notes (Signed)
Discharged home today with instructions and belongings given to patient. Post-op site kept open. No further questions noted

## 2017-09-24 NOTE — Op Note (Signed)
NAME:  JULY, NICKSON NO.:  MEDICAL RECORD NO.:  [id number]  LOCATION:                                 FACILITY:  PHYSICIAN:  Fannie Knee, M.D.         DATE OF BIRTH: 22-Dec-1925  DATE OF PROCEDURE: 09-23-2017 DATE OF DISCHARGE: 09-24-2017                              OPERATIVE REPORT   JUSTIFICATION FOR PROCEDURE:  Isaac [name] is an 83 year old white male who is here today for a left cochlear implant to treat moderately severe to severe bilateral sensorineural hearing loss that is beyond hearing aids.  The patient has been extensively tested in my office and was found to be a cochlear implant candidate.  CT scanning of his temporal bones documented patent cochleae bilaterally.  He underwent special cochlear implant testing including CNC and [employer] testing which confirmed his candidacy.  He deteriorated significantly in noise testing.  He and his wife were extensively counseled concerning the advantages and disadvantages of multichannel cochlear implantation. Because of his age of 8 years, I recommended an Advanced Bionics HiRes Ultra 3D cochlear implant with a HiFocus mid-scalar electrode. The [employer] Ultra 3D implant would enable him to undergo MRI scanning in the future.  The patient agreed with this implant and signed our cochlear implant informed consent form.  Risks, complications, and alternatives of cochlear implantation were explained to him.  Questions were invited and answered. Informed consent was read, signed, and witnessed.  The patient had undergone a triple coronary artery bypass graft procedure 1 year ago. Therefore the procedure was scheduled in [facility] Operating Room rather than in the ambulatory surgery setting.  JUSTIFICATION FOR INPATIENT SETTING:  The patient's age, need for general endotracheal anesthesia and history of coronary artery bypass grafting.  JUSTIFICATION FOR OVERNIGHT STAY:  23 hours of observation status post left  cochlear implantation.  SURGEON:  Fannie Knee, MD.  ANESTHESIA:  General endotracheal.  COMPLICATIONS:  None.  SUMMARY OF REPORT:  After the patient was taken to the operating room #9 at [place], he was placed in the supine position.  An IV had been begun in the holding area.  A general IV induction was then performed by anesthesiologist, Dr. Adele Barthel, MD. The patient was orally intubated by [facility], CRNA without difficulty. Eyelids were taped shut> He was properly positioned and monitored. Elbows and ankles were padded with foam rubber, and I initiated a time- out at 7:49 a.m.  The patient's left ear canal was then cleaned of cerumen and debris.  1 mL of Decadron 10 mg/mL was then injected into the left middle ear space transtympanically using a 25-gauge spinal needle.  The patient was then turned 90 degrees clockwise.  A small amount of hair was clipped in the left postauricular areas.  Hair was taped, and stockinette cap was applied.  Using an ABC sound processor template, a left postauricular incision was then marked and carried into a lazy-S type cochlear implant incision.  The proposed incision area was then infiltrated with 7 mL of 1% Xylocaine with 1:100,000 epinephrine.  Silver wire needle electrodes were then inserted into the left orbicularis  oris and oculi muscles and suprasternal notch.  They were connected to a NIM facial nerve monitor preamplifier.  A Foley catheter was inserted by the circulating nurse.  The patient's left ear and hemiface were then prepped with Betadine and draped in the standard fashion for a cochlear implant.  A left postauricular incision was then created with a 15 blade and carried down through skin and subcutaneous tissue.  A T-shaped incision was made in the musculoperiosteal layer. Anterior and posterior subperiosteal flaps were elevated.  The mastoid cortex was exposed.  The mastoid cortex was then removed with a  Stryker drill using continuous suction irrigation and a #5 bur.  The mastoid was found to be moderately Pneumatized but not well pneumatized.  The dissection was carried down to the antrum and to the attic.  The dome of the lateral semicircular canal was identified, as was the short process of the incus.  The facial recess was then opened with a #3 match head bur and a #2 diamond bur.  The facial nerve was exposed for 1 mm in the inferior portion of the facial recess.  The chorda tympani nerve was coursing directly through the facial recess and was sacrificed.  The facial recess was enlarged.  The buttress was thinned, and the middle ear was examined. The round window niche was overhung.  Using a 1-mm diamond bur, the round window niche overhang was removed to expose the round window membrane.  A disc of Gelfoam soaked in Decadron 10 mg/mL was then placed against the round window membrane.  A cottonoid was placed in the facial recess.  Attention was turned to the temporoparietal skull.  I had previously marked a place for the ICS on the skull using methylene blue.  The mark was identified.  A bony well was then drilled for the ICS.  The shape of the rectangular Ultra 3D cochlear implant was created with the aid of an Ultra 3D dummy. Dissection was then carried posteriorly to make a soft tissue pocket for the antenna portion of the ICS.  A trough was then drilled from the anterior portion of the bony well to the posterior- superior aspect of the mastoid cavity.  The edge of the mastoid cavity had been previously undercut with a #3 bur.  All edges were smoothed. The site was copiously irrigated with bacitracin-containing saline.  Gowns and gloves were changed.  An Advanced Bionics HiRes Ultra 3D cochlear implant with a  HiFocus mid-scalar electrode, serial [id number], was then opened under saline and inspected.  It appeared to be intact.  The implant was then transferred to the [facility]  stand.  The cochlear implant was then placed in its pocket, and the anterior portion was placed in the bony well.  A musculoperiosteal flap was then covered over the implant and sutured with interrupted 3-0 chromic to stabilize the implant tightly against the skull.  A flap thickness measurement was taken with skin calipers. Flap thickness was 6 mm.  The flap was not thinned or defatted.  Attention was turned back to the mastoid. Gel-Foam was removed from the round window niche area.  A #25-gauge spinal needle mounted on a 3-mL syringe was then used to make a small puncture hole in the round window membrane.  This was enlarged slightly with a 5910 Beaver blade to the size of the diameter of  the electrode array.  The electrode sheath was removed from the electrode array, and the electrode array was oriented toward the modiolus.  The electrode array was secured with an offset forceps.  The tip of the electrode array was then placed in the opening in the round window membrane, and the electrode array was gently inserted into the basilar turn of the cochlea to the first blue mark.   Jewelers forceps were used to hold the stylet and then offset forceps were used to gently insert the electrode array into the cochlea over a 2-minute period by the clock.  The electrode array was fully inserted to the second blue mark  at the round window membrane.  Small rectangles of fascia were then packed around the electrode array at the round window membrane to seal the round window membrane opening around the electrode.  A larger muscle plug was then placed in the facial recess.  Prior to inserting the electrode array into the cochlear, the facial nerve was tested and stimulated at 0.2 milliamps.  The proximal electrode array was then coiled into the mastoid cavity and held in place with several discs of Gelfoam soaked in Ciprodex and 3 large rectangles of Gelfoam soaked in Ciprodex.  The muscular  periosteal layer was then closed with interrupted 3-0 chromics.  The site was copiously irrigated with bacitracin-containing saline.  The subcutaneous layer was then closed with interrupted inverted 3-0 chromics.  At this point, an intraoperative C-arm x-ray was performed and documented that the electrode array was in good position in the cochlea.  I had wanted an intraoperative electrical test of the implant, however, [employer] had not shipped the patient's sound processor to the hospital.   The remaining subcutaneous sutures were then placed for an airtight closure.  Skin was closed with skin staples.  Bacitracin ointment was applied to the incision.  The patient's Foley catheter was removed by the circulating nurse.  All NIM  electrodes were then removed.  The patient's left ear was then padded with Telfa and cotton. A standard adult Glasscock mastoid dressing was applied loosely in the standard fashion.  The patient was then awakened, extubated, and transferred to his hospital bed.  He appeared to tolerate both the general endotracheal anesthesia and the procedure Well. He left the operating room in stable condition.  TOTAL FLUIDS:  1300 mL. TOTAL ESTIMATED BLOOD LOSS:  Less than 20 mL. TOTAL URINE OUTPUT:  500 mL. COUNTS:  All sponge, needle, and cotton ball counts were correct at the          termination of the procedure. PATHOLOGIC SPECIMENS:  No specimens were sent to Pathology.  The patient was then transferred to the PACU in stable condition.  He will be admitted to the hospital for 23 hours of overnight observation.  If stable, he will be discharged on the morning of September 24, 2017. His wife will be instructed to have him return to my office on October 06, 2017, at 2:45 p.m., for followup and staple removal.  DISCHARGE MEDICATIONS:  Will include the following:  1. Cefzil 500 mg p.o. b.i.d. x10 days with food. 2. Vicodin 5/300 one tab p.o. q.4 hours p.r.n. pain (#10  tabs). 3. Mupirocin ointment applied to the incision b.i.d. x1 week.  The patient is to keep his head elevated on 2 pillows for the next week, avoid heavy lifting or straining, and avoid direct trauma over the implant site.  He is to call [phone] for any postoperative problems directly related to the procedure.  He will be given both verbal and written instructions.  SUMMARY:  The patient underwent an uncomplicated left  cochlear implant for treatment of moderately severe to severe bilateral sensorineural hearing loss beyond hearing aids.  He was implanted with an Advanced Bionics HiRes Ultra 3D cochlear implant with a HiFocus mid-scalar electrode, serial [id number].  A full-length insertion was accomplished. This was documented by an intraoperative C-arm x-ray.  Intraoperative testing of the electrode could not be performed due to not receiving the sound processor from [employer].  The facial nerve was exposed for 1 mm in the inferior portion of the facial recess.  The ossicles were intact and mobile.  The chorda tympani nerve was sacrificed as it coursed directly through the facial recess.  There were no complications. Sponge, needle, and cotton ball counts were correct at the termination of the procedure.   Fannie Knee, M.D.     EMK/MEDQ  D:  09/23/2017  T:  09/23/2017  Job:  [id number]  cc:   Fannie Knee, M.D.'s office

## 2017-09-24 NOTE — Discharge Summary (Signed)
Physician Discharge Summary  Patient ID: Isaac [name] MRN: [id number] DOB/AGE: 1926/02/01 83 y.o.  Admit date: 09/23/2017 Discharge date: 09/24/2017  Admission Diagnoses: Moderately severe to severe bilateral sensorineural hearing loss beyond hearing aids  Discharge Diagnoses: same Active Problems:   Postoperative state   Complications: none  Discharged Condition: stable  Hospital Course: Patient admitted on 09-23-17 through short stay unit. Taken to OR #9 at [facility] Main OR. Underwent general ET anesthesia by Dr. Roanna Banning without complication. Implanted with an Advanced Bionics HiRes Ultra 3D CI with HiFocus mid-scalar electrode, SN [id number] without complication. Facial nerve stimulated at 0.2 ma. Implant position documented on intraoperative C-arm xray. Admitted to PACU and transferred to 6N-Rm 20. Voided upon return to 6N but unable to void the rest of the day. Foley catheter inserted in early evening 09-23-17 and removed at [place] on 09-24-17. Incision dry and without hematoma. Facial function intact. No nystagmus. Discharged in stable condition on morning of 09-24-17 with wife. No complications.  Consults: None  Significant Diagnostic Studies: intraoperative C-arm xray on 09-23-17 demonstrating good positioning of CI electrode within left cochlea.  Treatments: surgery: Left cochlear implant with Advanced Bionics HiRes Ultra 3D CI with HiFocus mid-scalar electrode  Discharge Exam: Blood pressure 132/68, pulse 72, temperature 98.1 F (36.7 C), resp. rate 18, height 5\' 9"  (1.753 m), weight 69.9 kg (154 lb), SpO2 94 %. Incision dry and without hematoma. Facial function intact. No nystagmus. Discharged in stable condition on morning of 09-24-17 with wife. No complications.  Disposition: 06-Home-Health Care Svc - home with wife Diet Regular  Room Locations : Short Stay #46, [facility] OR Rm #9, PACU, 6N-Rm 20.  Activity  Quiet indoor activity x 1 wk. No swimming. Avoid left head trauma.       SignedThornell Mule, Deston Bilyeu M 09/24/2017, 8:33 AM

## 2017-09-25 ENCOUNTER — Encounter (HOSPITAL_COMMUNITY): Payer: Self-pay | Admitting: Otolaryngology

## 2017-09-29 ENCOUNTER — Encounter (HOSPITAL_COMMUNITY): Payer: Self-pay | Admitting: Otolaryngology

## 2017-10-13 DIAGNOSIS — K5792 Diverticulitis of intestine, part unspecified, without perforation or abscess without bleeding: Secondary | ICD-10-CM | POA: Diagnosis not present

## 2017-10-27 DIAGNOSIS — R2681 Unsteadiness on feet: Secondary | ICD-10-CM | POA: Diagnosis not present

## 2017-10-27 DIAGNOSIS — R29898 Other symptoms and signs involving the musculoskeletal system: Secondary | ICD-10-CM | POA: Diagnosis not present

## 2017-10-29 ENCOUNTER — Other Ambulatory Visit: Payer: Self-pay | Admitting: Cardiology

## 2017-11-03 DIAGNOSIS — R29898 Other symptoms and signs involving the musculoskeletal system: Secondary | ICD-10-CM | POA: Diagnosis not present

## 2017-11-03 DIAGNOSIS — R2681 Unsteadiness on feet: Secondary | ICD-10-CM | POA: Diagnosis not present

## 2017-11-06 DIAGNOSIS — R29898 Other symptoms and signs involving the musculoskeletal system: Secondary | ICD-10-CM | POA: Diagnosis not present

## 2017-11-06 DIAGNOSIS — K5792 Diverticulitis of intestine, part unspecified, without perforation or abscess without bleeding: Secondary | ICD-10-CM | POA: Diagnosis not present

## 2017-11-06 DIAGNOSIS — Z09 Encounter for follow-up examination after completed treatment for conditions other than malignant neoplasm: Secondary | ICD-10-CM | POA: Diagnosis not present

## 2017-11-06 DIAGNOSIS — R194 Change in bowel habit: Secondary | ICD-10-CM | POA: Diagnosis not present

## 2017-11-06 DIAGNOSIS — R2681 Unsteadiness on feet: Secondary | ICD-10-CM | POA: Diagnosis not present

## 2017-11-07 DIAGNOSIS — R29898 Other symptoms and signs involving the musculoskeletal system: Secondary | ICD-10-CM | POA: Diagnosis not present

## 2017-11-07 DIAGNOSIS — R2681 Unsteadiness on feet: Secondary | ICD-10-CM | POA: Diagnosis not present

## 2017-11-09 DIAGNOSIS — H903 Sensorineural hearing loss, bilateral: Secondary | ICD-10-CM | POA: Diagnosis not present

## 2017-11-10 DIAGNOSIS — R2681 Unsteadiness on feet: Secondary | ICD-10-CM | POA: Diagnosis not present

## 2017-11-10 DIAGNOSIS — R29898 Other symptoms and signs involving the musculoskeletal system: Secondary | ICD-10-CM | POA: Diagnosis not present

## 2017-11-11 DIAGNOSIS — R2681 Unsteadiness on feet: Secondary | ICD-10-CM | POA: Diagnosis not present

## 2017-11-11 DIAGNOSIS — R29898 Other symptoms and signs involving the musculoskeletal system: Secondary | ICD-10-CM | POA: Diagnosis not present

## 2017-11-13 DIAGNOSIS — R29898 Other symptoms and signs involving the musculoskeletal system: Secondary | ICD-10-CM | POA: Diagnosis not present

## 2017-11-13 DIAGNOSIS — R2681 Unsteadiness on feet: Secondary | ICD-10-CM | POA: Diagnosis not present

## 2017-11-18 DIAGNOSIS — R2681 Unsteadiness on feet: Secondary | ICD-10-CM | POA: Diagnosis not present

## 2017-11-18 DIAGNOSIS — R29898 Other symptoms and signs involving the musculoskeletal system: Secondary | ICD-10-CM | POA: Diagnosis not present

## 2017-11-23 DIAGNOSIS — H903 Sensorineural hearing loss, bilateral: Secondary | ICD-10-CM | POA: Diagnosis not present

## 2017-12-02 DIAGNOSIS — R197 Diarrhea, unspecified: Secondary | ICD-10-CM | POA: Diagnosis not present

## 2017-12-08 DIAGNOSIS — R197 Diarrhea, unspecified: Secondary | ICD-10-CM | POA: Diagnosis not present

## 2017-12-08 DIAGNOSIS — H903 Sensorineural hearing loss, bilateral: Secondary | ICD-10-CM | POA: Diagnosis not present

## 2017-12-21 ENCOUNTER — Other Ambulatory Visit: Payer: Self-pay | Admitting: Cardiology

## 2017-12-21 DIAGNOSIS — H903 Sensorineural hearing loss, bilateral: Secondary | ICD-10-CM | POA: Diagnosis not present

## 2018-01-06 DIAGNOSIS — A09 Infectious gastroenteritis and colitis, unspecified: Secondary | ICD-10-CM | POA: Diagnosis not present

## 2018-01-07 DIAGNOSIS — A09 Infectious gastroenteritis and colitis, unspecified: Secondary | ICD-10-CM | POA: Diagnosis not present

## 2018-01-20 DIAGNOSIS — H903 Sensorineural hearing loss, bilateral: Secondary | ICD-10-CM | POA: Diagnosis not present

## 2018-01-21 ENCOUNTER — Other Ambulatory Visit: Payer: Self-pay | Admitting: Gastroenterology

## 2018-01-21 DIAGNOSIS — R634 Abnormal weight loss: Secondary | ICD-10-CM | POA: Diagnosis not present

## 2018-02-03 ENCOUNTER — Ambulatory Visit
Admission: RE | Admit: 2018-02-03 | Discharge: 2018-02-03 | Disposition: A | Discharge status: Home / Self Care | Payer: Medicare Other | Source: Ambulatory Visit | Attending: Gastroenterology | Admitting: Gastroenterology

## 2018-02-03 DIAGNOSIS — K5730 Diverticulosis of large intestine without perforation or abscess without bleeding: Secondary | ICD-10-CM | POA: Diagnosis not present

## 2018-02-03 DIAGNOSIS — R634 Abnormal weight loss: Secondary | ICD-10-CM

## 2018-02-12 DIAGNOSIS — R634 Abnormal weight loss: Secondary | ICD-10-CM | POA: Diagnosis not present

## 2018-03-10 DIAGNOSIS — G309 Alzheimer's disease, unspecified: Secondary | ICD-10-CM | POA: Diagnosis not present

## 2018-03-10 DIAGNOSIS — I10 Essential (primary) hypertension: Secondary | ICD-10-CM | POA: Diagnosis not present

## 2018-03-10 DIAGNOSIS — Z0000 Encounter for general adult medical examination without abnormal findings: Secondary | ICD-10-CM | POA: Diagnosis not present

## 2018-03-10 DIAGNOSIS — E039 Hypothyroidism, unspecified: Secondary | ICD-10-CM | POA: Diagnosis not present

## 2018-03-10 DIAGNOSIS — I2510 Atherosclerotic heart disease of native coronary artery without angina pectoris: Secondary | ICD-10-CM | POA: Diagnosis not present

## 2018-03-10 DIAGNOSIS — Z1389 Encounter for screening for other disorder: Secondary | ICD-10-CM | POA: Diagnosis not present

## 2018-03-10 DIAGNOSIS — E7800 Pure hypercholesterolemia, unspecified: Secondary | ICD-10-CM | POA: Diagnosis not present

## 2018-03-10 DIAGNOSIS — Z23 Encounter for immunization: Secondary | ICD-10-CM | POA: Diagnosis not present

## 2018-03-17 DIAGNOSIS — H903 Sensorineural hearing loss, bilateral: Secondary | ICD-10-CM | POA: Diagnosis not present

## 2018-05-12 DIAGNOSIS — H903 Sensorineural hearing loss, bilateral: Secondary | ICD-10-CM | POA: Diagnosis not present

## 2018-06-02 DIAGNOSIS — H903 Sensorineural hearing loss, bilateral: Secondary | ICD-10-CM | POA: Diagnosis not present

## 2018-07-19 DIAGNOSIS — H353231 Exudative age-related macular degeneration, bilateral, with active choroidal neovascularization: Secondary | ICD-10-CM | POA: Diagnosis not present

## 2018-07-19 DIAGNOSIS — H524 Presbyopia: Secondary | ICD-10-CM | POA: Diagnosis not present

## 2018-07-27 DIAGNOSIS — D229 Melanocytic nevi, unspecified: Secondary | ICD-10-CM | POA: Diagnosis not present

## 2018-07-27 DIAGNOSIS — L821 Other seborrheic keratosis: Secondary | ICD-10-CM | POA: Diagnosis not present

## 2018-08-05 DIAGNOSIS — Z23 Encounter for immunization: Secondary | ICD-10-CM | POA: Diagnosis not present

## 2018-08-05 DIAGNOSIS — G309 Alzheimer's disease, unspecified: Secondary | ICD-10-CM | POA: Diagnosis not present

## 2018-09-09 DIAGNOSIS — K921 Melena: Secondary | ICD-10-CM | POA: Diagnosis not present

## 2018-10-04 ENCOUNTER — Ambulatory Visit (INDEPENDENT_AMBULATORY_CARE_PROVIDER_SITE_OTHER): Payer: Medicare Other | Admitting: Neurology

## 2018-10-04 ENCOUNTER — Encounter: Payer: Self-pay | Admitting: Neurology

## 2018-10-04 ENCOUNTER — Telehealth: Payer: Self-pay | Admitting: Neurology

## 2018-10-04 VITALS — BP 145/73 | HR 73 | Ht 69.0 in | Wt 161.0 lb

## 2018-10-04 DIAGNOSIS — F0390 Unspecified dementia without behavioral disturbance: Secondary | ICD-10-CM

## 2018-10-04 DIAGNOSIS — R4189 Other symptoms and signs involving cognitive functions and awareness: Secondary | ICD-10-CM

## 2018-10-04 MED ORDER — MEMANTINE HCL 5 MG PO TABS: 5.0000 mg | ORAL_TABLET | Freq: Two times a day (BID) | ORAL | 4 refills | Status: DC

## 2018-10-04 MED ORDER — MEMANTINE HCL 5 MG PO TABS: 5.0000 mg | ORAL_TABLET | Freq: Two times a day (BID) | ORAL | 3 refills | Status: DC

## 2018-10-04 NOTE — Telephone Encounter (Signed)
Medicare/aarp order sent to [facility] for pt to be aware. I did leave gi phone number of [phone] and to give them a call if he has not heard in the next 2-3 business days.

## 2018-10-04 NOTE — Progress Notes (Signed)
[facility]    Provider:  Dr Jaynee Eagles Referring Provider: Lawerance Cruel, MD Primary Care Physician:  Lawerance Cruel, MD  CC:  Dementia  HPI:  Isaac [name] is a 84 y.o. male here as requested by Dr. Harrington Challenger for Dementia. PMHx Alzheimer's dementia, HTN, hearing loss, CAD, weight loss, hypothyroid, HLD. Here with his wife who provides much information. He has some memory loss, particularly with names. He feels confused. He has had difficulty remembering names for "some time". He used to be a Automotive engineer. 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 Wife thinks the memory changes started after they were married 4 years ago, worse in the last year, slowly progressive. He says he has had difficulty adjusting to the marriage. He drives, no accidents, feels no problems with driving. He forgets things they talked about in the same day. He tells the same stories "over and over". He has vivid dreams but his wife does not sleep with him. Sister has dementia.   Reviewed notes, labs and imaging from outside physicians, which showed:  Reviewed notes from referring physician Dr. Harrington Challenger.  Patient has a history of dementia.  He is getting his dreams mixed up with reality not sleeping well.  Memory seems to be getting worse.  More trouble finding words.  He discussed end-of-life planning, living will, healthcare power of attorney.  He is taking B12, magnesium, biotin, fish oil, vitamin C he is also already on Aricept.   Review of Systems: Patient complains of symptoms per HPI as well as the following symptoms: hearing loss, decreased energy. Pertinent negatives and positives per HPI. All others negative.   Social History   Socioeconomic History  . Marital status: Married    Spouse name: Not on file  . Number of children: 1  . Years of education: 62  . Highest education level: Master's degree (e.g.,  MA, MS, MEng, MEd, MSW, MBA)  Occupational History  . Not on file  Social Needs  . Financial resource strain: Not on file  . Food insecurity:    Worry: Not on file    Inability: Not on file  . Transportation needs:    Medical: Not on file    Non-medical: Not on file  Tobacco Use  . Smoking status: Never Smoker  . Smokeless tobacco: Never Used  Substance and Sexual Activity  . Alcohol use: No  . Drug use: No  . Sexual activity: Not on file  Lifestyle  . Physical activity:    Days per week: Not on file    Minutes per session: Not on file  . Stress: Not on file  Relationships  . Social connections:    Talks on phone: Not on file    Gets together: Not on file    Attends religious service: Not on file    Active member of club or organization: Not on file    Attends meetings of clubs or organizations: Not on file    Relationship status: Not on file  . Intimate partner violence:    Fear of current or ex partner: Not on file    Emotionally abused: Not on file    Physically abused: Not on file    Forced sexual activity: Not on file  Other Topics Concern  . Not on file  Social History Narrative   Lives at [facility] since 08/11/15 in [place]   Married  wife [name]   Never smoked   Alcohol none    Family History  Problem Relation Age of Onset  . Heart attack Father   . Pneumonia Mother   . Dementia Sister     Past Medical History:  Diagnosis Date  . Alzheimer's dementia ([place])   . Angina pectoris ([place])   . Carotid stenosis    a. Carotid US 9/16:  Bilateral ICA 1-39%  . Coronary artery disease    a. s/p PCI in 2005 in [place] for MI; b. NSTEMI 9/16 >> LHC with 2 v CAD >> s/p CABG   . Dementia without behavioral disturbance ([place])   . Diverticulitis   . Hearing loss   . History of MI (myocardial infarction) 2005   treated in [place], [place]  . History of transesophageal echocardiography (TEE) for monitoring    a. Intra-Op TEE 9/16:  Mild LVH, EF 55-60%, normal wall  motion, mild MR, no PFO  . HLD (hyperlipidemia)   . Hypertension   . MI (myocardial infarction) ([place])   . Sciatica   . Sensorineural hearing loss (SNHL), bilateral    severe    Past Surgical History:  Procedure Laterality Date  . APPENDECTOMY    . CARDIAC CATHETERIZATION N/A 08/03/2015   Procedure: Left Heart Cath and Coronary Angiography;  Surgeon: Peter M Martinique, MD;  Location: [facility];  Service: Cardiovascular;  Laterality: N/A;  . CARDIAC CATHETERIZATION  08/03/2015   Procedure: Intravascular Pressure Wire/FFR Study;  Surgeon: Peter M Martinique, MD;  Location: [facility];  Service: Cardiovascular;;  . CHOLECYSTECTOMY    . COCHLEAR IMPLANT Left 09/23/2017   Procedure: COCHLEAR IMPLANT;  Surgeon: Vicie Mutters, MD;  Location: [facility];  Service: ENT;  Laterality: Left;  . CORONARY ARTERY BYPASS GRAFT N/A 08/07/2015   Procedure: CORONARY ARTERY BYPASS GRAFTING (CABG) x 3 with Endoscopic Vein Harvesting of greater saphenous vein from bilateral thighs;  Surgeon: [name], MD;  Location: [facility];  Service: Open Heart Surgery;  Laterality: N/A;  . CORONARY STENT PLACEMENT    . HERNIA REPAIR    . LEFT HEART CATH AND CORS/GRAFTS ANGIOGRAPHY N/A 04/02/2017   Procedure: Left Heart Cath and Cors/Grafts Angiography;  Surgeon: Jettie Booze, MD;  Location: [facility];  Service: Cardiovascular;  Laterality: N/A;  . TEE WITHOUT CARDIOVERSION N/A 08/07/2015   Procedure: TRANSESOPHAGEAL ECHOCARDIOGRAM (TEE);  Surgeon: [name], MD;  Location: [facility];  Service: Open Heart Surgery;  Laterality: N/A;  . TONSILLECTOMY      Current Outpatient Medications  Medication Sig Dispense Refill  . Ascorbic Acid (VITAMIN C PO) Take by mouth.    Marland Kitchen aspirin EC 81 MG tablet Take 81 mg by mouth daily.    . clopidogrel (PLAVIX) 75 MG tablet Take 75 mg by mouth daily.    . Cyanocobalamin (B-12 PO) Take 500 mg by mouth daily.    Marland Kitchen donepezil (ARICEPT) 10 MG tablet Take 10 mg by mouth  at bedtime.    Marland Kitchen glucosamine-chondroitin 500-400 MG tablet Take 1 tablet by mouth 2 (two) times daily.     Marland Kitchen levothyroxine (SYNTHROID, LEVOTHROID) 50 MCG tablet Take 50 mcg by mouth daily before breakfast.    . lisinopril (PRINIVIL,ZESTRIL) 5 MG tablet Take 1 tablet (5 mg total) by mouth daily. 30 tablet 1  . metoprolol tartrate (LOPRESSOR) 25 MG tablet Take 1/2 tablet every AM by mouth and 1/4 tablet every PM by mouth daily (Patient taking differently: Take 12.5  mg by mouth 2 (two) times daily. ) 90 tablet 2  . Omega-3 Fatty Acids (FISH OIL PO) Take by mouth.    . pravastatin (PRAVACHOL) 40 MG tablet Take 1 tablet (40 mg total) by mouth every evening. 90 tablet 2  . memantine (NAMENDA) 5 MG tablet Take 1 tablet (5 mg total) by mouth 2 (two) times daily. 180 tablet 4   No current facility-administered medications for this visit.     Allergies as of 10/04/2018  . (No Known Allergies)    Vitals: BP (!) 145/73 (BP Location: Right Arm, Patient Position: Sitting)   Pulse 73   Ht 5\' 9"  (1.753 m)   Wt 161 lb (73 kg)   BMI 23.78 kg/m  Last Weight:  Wt Readings from Last 1 Encounters:  10/04/18 161 lb (73 kg)   Last Height:   Ht Readings from Last 1 Encounters:  10/04/18 5\' 9"  (1.753 m)   Physical exam: Exam: Gen: NAD, tangential                   CV: RRR, no MRG. No Carotid Bruits. No peripheral edema, warm, nontender Eyes: Conjunctivae clear without exudates or hemorrhage  Neuro: Detailed Neurologic Exam  Speech:    Speech is normal; fluent  Cognition:   MMSE - Mini Mental State Exam 10/04/2018  Orientation to time 5  Orientation to Place 4  Registration 3  Attention/ Calculation 1  Recall 2  Language- name 2 objects 2  Language- repeat 1  Language- follow 3 step command 3  Language- read & follow direction 1  Write a sentence 0  Copy design 0  Total score 22    Cranial Nerves:    The pupils are equal, round, and reactive to light.attempted fundoscopy could not  visualize. Visual fields are full to finger confrontation. Impaired upgaze. Trigeminal sensation is intact and the muscles of mastication are normal. The face is symmetric. The palate elevates in the midline. Hearing impaired. Voice is normal. Shoulder shrug is normal. The tongue has normal motion without fasciculations.   Coordination:    Normal finger to nose  Gait:    Good stride, good arm swing, no shuffling  Motor Observation:    No asymmetry, no atrophy, and no involuntary movements noted. Tone:    Normal muscle tone.    Posture:    Posture is slightly stooped.     Strength:    Strength is V/V in the upper and lower limbs.      Sensation: intact to LT     Reflex Exam:  DTR's:    Deep tendon reflexes in the upper extremities normal, brisk in the lowers.   Toes:    Left equiv, right downgoing    Clonus:   2 beats AJs       Assessment/Plan:  84 y.o. male here as requested by Dr. Harrington Challenger for Dementia. Patient very tangential, difficult to get a lot of information from him, but wife reports less than 4 years of progressive short-term memory loss and he reports vivid dreams (wife does not sleep with him so unclear about his dreams) he is not sleep walking or wandering at night. Sister has dementia. Not parkinsonian on exam. Likely Alzheimers but needs evaluation. MMSE 22/30, he used to be a college professor so this score in this patient likely represents moderately advanced dementia.   MRI of the brain for reversible causes of dementia Labs today Start Namenda(memantine) for memory loss Follow up in 8 weeks  to review and possibly increase namenda May consider neuropsych tresting and FDG pet scan, discuss at upcoming appointment  Cc:Dr. Harrington Challenger  Orders Placed This Encounter  Procedures  . MR BRAIN W WO CONTRAST  . B12 and Folate Panel  . Methylmalonic acid, serum  . Vitamin B1  . Basic Metabolic Panel   Sarina Ill, MD  [facility] [address] [place], [place] [address]  Phone [phone] Fax [phone]

## 2018-10-04 NOTE — Telephone Encounter (Signed)
Pt unable to have MRI at [facility] due to to cochlear implant please advise

## 2018-10-04 NOTE — Patient Instructions (Addendum)
MRI of the brain Labs today Start Namenda(memantine) for memory loss Follow up in 8 weeks to review and possibly increase namenda  Memantine Tablets What is this medicine? MEMANTINE (MEM an teen) is used to treat dementia caused by Alzheimer's disease. This medicine may be used for other purposes; ask your health care provider or pharmacist if you have questions. COMMON BRAND NAME(S): Namenda What should I tell my health care provider before I take this medicine? They need to know if you have any of these conditions: -difficulty passing urine -kidney disease -liver disease -seizures -an unusual or allergic reaction to memantine, other medicines, foods, dyes, or preservatives -pregnant or trying to get pregnant -breast-feeding How should I use this medicine? Take this medicine by mouth with a glass of water. Follow the directions on the prescription label. You may take this medicine with or without food. Take your doses at regular intervals. Do not take your medicine more often than directed. Continue to take your medicine even if you feel better. Do not stop taking except on the advice of your doctor or health care professional. Talk to your pediatrician regarding the use of this medicine in children. Special care may be needed. Overdosage: If you think you have taken too much of this medicine contact a poison control center or emergency room at once. NOTE: This medicine is only for you. Do not share this medicine with others. What if I miss a dose? If you miss a dose, take it as soon as you can. If it is almost time for your next dose, take only that dose. Do not take double or extra doses. If you do not take your medicine for several days, contact your health care provider. Your dose may need to be changed. What may interact with this  medicine? -acetazolamide -amantadine -cimetidine -dextromethorphan -dofetilide -hydrochlorothiazide -ketamine -metformin -methazolamide -quinidine -ranitidine -sodium bicarbonate -triamterene This list may not describe all possible interactions. Give your health care provider a list of all the medicines, herbs, non-prescription drugs, or dietary supplements you use. Also tell them if you smoke, drink alcohol, or use illegal drugs. Some items may interact with your medicine. What should I watch for while using this medicine? Visit your doctor or health care professional for regular checks on your progress. Check with your doctor or health care professional if there is no improvement in your symptoms or if they get worse. You may get drowsy or dizzy. Do not drive, use machinery, or do anything that needs mental alertness until you know how this drug affects you. Do not stand or sit up quickly, especially if you are an older patient. This reduces the risk of dizzy or fainting spells. Alcohol can make you more drowsy and dizzy. Avoid alcoholic drinks. What side effects may I notice from receiving this medicine? Side effects that you should report to your doctor or health care professional as soon as possible: -allergic reactions like skin rash, itching or hives, swelling of the face, lips, or tongue -agitation or a feeling of restlessness -depressed mood -dizziness -hallucinations -redness, blistering, peeling or loosening of the skin, including inside the mouth -seizures -vomiting Side effects that usually do not require medical attention (report to your doctor or health care professional if they continue or are bothersome): -constipation -diarrhea -headache -nausea -trouble sleeping This list may not describe all possible side effects. Call your doctor for medical advice about side effects. You may report side effects to FDA at 1-800-FDA-1088. Where should I keep my medicine?  Keep  out of the reach of children. Store at room temperature between 15 degrees and 30 degrees C (59 degrees and 86 degrees F). Throw away any unused medicine after the expiration date. NOTE: This sheet is a summary. It may not cover all possible information. If you have questions about this medicine, talk to your doctor, pharmacist, or health care provider.  2018 Elsevier/Gold Standard (2013-08-29 14:10:42)

## 2018-10-05 ENCOUNTER — Encounter: Payer: Self-pay | Admitting: Neurology

## 2018-10-05 NOTE — Telephone Encounter (Signed)
[facility] have it at [facility]?

## 2018-10-06 NOTE — Telephone Encounter (Signed)
I think I found the information about the device I faxed the info to [facility] and if it is MRI safe she will contact him to schedule other wise will let me know.

## 2018-10-07 ENCOUNTER — Telehealth: Payer: Self-pay | Admitting: *Deleted

## 2018-10-07 LAB — VITAMIN B1: Thiamine: 177.5 nmol/L (ref 66.5–200.0)

## 2018-10-07 LAB — BASIC METABOLIC PANEL
BUN / CREAT RATIO: 21 (ref 10–24)
BUN: 23 mg/dL (ref 10–36)
CO2: 22 mmol/L (ref 20–29)
CREATININE: 1.08 mg/dL (ref 0.76–1.27)
Calcium: 10 mg/dL (ref 8.6–10.2)
Chloride: 105 mmol/L (ref 96–106)
GFR, EST AFRICAN AMERICAN: 69 mL/min/{1.73_m2} (ref >59)
GFR, EST NON AFRICAN AMERICAN: 60 mL/min/{1.73_m2} (ref >59)
Glucose: 101 mg/dL — ABNORMAL HIGH (ref 65–99)
Potassium: 4.8 mmol/L (ref 3.5–5.2)
SODIUM: 144 mmol/L (ref 134–144)

## 2018-10-07 LAB — METHYLMALONIC ACID, SERUM: METHYLMALONIC ACID: 156 nmol/L (ref 0–378)

## 2018-10-07 LAB — B12 AND FOLATE PANEL
FOLATE: 16.6 ng/mL (ref >3.0)
VITAMIN B 12: 1318 pg/mL — AB (ref 232–1245)

## 2018-10-07 NOTE — Telephone Encounter (Signed)
Patient is scheduled at [facility] for 10/13/18 arrival time is 10:45 AM patient wife [name] is aware of this she also has their number of [phone] and to give them a call if she needs to r/s

## 2018-10-07 NOTE — Telephone Encounter (Signed)
Spoke with pt's wife Isaac [name]. Advised her that labs are normal. She verbalized appreciation. She asked if there was an update regarding pt's MRI (pt has cochlear implant). RN advised that she would send a message to the coordinator to find out the status. Isaac [name] verbalized appreciation.

## 2018-10-07 NOTE — Telephone Encounter (Signed)
Patient is scheduled at [facility] for 10/13/18 arrival time is 10:45 AM patient wife [name] is aware of this she also has their number of [phone] and to give them a call if she needs to r/s

## 2018-10-07 NOTE — Telephone Encounter (Signed)
Called Isaac [name] back and updated her with [name]'s message. Currently we are waiting on [facility] to research and find out if they can scan with the cochlear implant. She was encouraged to call back early next weeks if she hasn't heard from them. Isaac [name] verbalized appreciation.

## 2018-10-07 NOTE — Telephone Encounter (Signed)
-----   Message from Melvenia Beam, MD sent at 10/07/2018 11:21 AM EST ----- Labs normal thanks

## 2018-10-13 ENCOUNTER — Ambulatory Visit (HOSPITAL_COMMUNITY)
Admission: RE | Admit: 2018-10-13 | Discharge: 2018-10-13 | Disposition: A | Discharge status: Home / Self Care | Payer: Medicare Other | Source: Ambulatory Visit | Attending: Neurology | Admitting: Neurology

## 2018-10-13 DIAGNOSIS — G319 Degenerative disease of nervous system, unspecified: Secondary | ICD-10-CM | POA: Insufficient documentation

## 2018-10-13 DIAGNOSIS — R4189 Other symptoms and signs involving cognitive functions and awareness: Secondary | ICD-10-CM | POA: Diagnosis not present

## 2018-10-13 DIAGNOSIS — Z9621 Cochlear implant status: Secondary | ICD-10-CM | POA: Insufficient documentation

## 2018-10-13 DIAGNOSIS — F0390 Unspecified dementia without behavioral disturbance: Secondary | ICD-10-CM | POA: Diagnosis not present

## 2018-10-13 MED ORDER — GADOBUTROL 1 MMOL/ML IV SOLN
7.5000 mL | Freq: Once | INTRAVENOUS | Status: AC | PRN
  Administered 2018-10-13: 7.5 mL via INTRAVENOUS

## 2018-10-15 ENCOUNTER — Telehealth: Payer: Self-pay

## 2018-10-15 NOTE — Telephone Encounter (Signed)
-----   Message from Garvin Fila, MD sent at 10/14/2018  1:08 PM EST ----- Kindly inform the patient that MRI scan shows age-appropriate shrinkage of the brain and some artifacts related to her his hearing implant.  No new or worrisome finding

## 2018-10-15 NOTE — Telephone Encounter (Signed)
Rn spoke with Isaac [name] pts wife on dpr. PTs wife wrote down the results. Rn stated the MRI scan shows age appropriate shrinkage of the brain and some artifacts from the hearing implant. No new or worrisome findings.THe wife verbalized understanding. ------

## 2018-10-25 DIAGNOSIS — R451 Restlessness and agitation: Secondary | ICD-10-CM | POA: Diagnosis not present

## 2018-11-10 ENCOUNTER — Telehealth: Payer: Self-pay | Admitting: Neurology

## 2018-11-10 NOTE — Telephone Encounter (Signed)
Called patient to r/s his appt on 1/7. Wife informed me that he will be moving to [place] and will need a referral to a Neurologist in [place]. I told the patient that I would let Dr. Jaynee Eagles know and she or her nurse will be in contact.

## 2018-11-10 NOTE — Telephone Encounter (Signed)
Spoke with pt's wife. She stated that pt will be relocating to [facility] [place] and pt already has a PCP there. RN suggested for pt to have his PCP send a referral for local neurologist and she verbalized understanding and appreciation.

## 2018-11-10 NOTE — Telephone Encounter (Signed)
Called pt/wife at home. Phone rang several times, no vm answer. Will try again later. If they call back, please inform them that unfortunately we cannot refer to another neurologist in this situation. Patient will need to locate one or have primary care refer him after he establishes care with a new primary care doctor there. They are then welcome to request records from our office but we cannot refer.

## 2018-11-30 ENCOUNTER — Ambulatory Visit: Payer: Medicare Other | Admitting: Neurology

## 2018-12-06 DIAGNOSIS — H903 Sensorineural hearing loss, bilateral: Secondary | ICD-10-CM | POA: Diagnosis not present

## 2018-12-14 DIAGNOSIS — H6120 Impacted cerumen, unspecified ear: Secondary | ICD-10-CM | POA: Diagnosis not present

## 2019-02-14 ENCOUNTER — Other Ambulatory Visit: Payer: Self-pay

## 2019-02-14 ENCOUNTER — Emergency Department (HOSPITAL_BASED_OUTPATIENT_CLINIC_OR_DEPARTMENT_OTHER)
Admission: EM | Admit: 2019-02-14 | Discharge: 2019-02-14 | Disposition: A | Discharge status: Home / Self Care | Payer: Medicare Other | Attending: Emergency Medicine | Admitting: Emergency Medicine

## 2019-02-14 ENCOUNTER — Emergency Department (HOSPITAL_BASED_OUTPATIENT_CLINIC_OR_DEPARTMENT_OTHER): Payer: Medicare Other

## 2019-02-14 ENCOUNTER — Encounter (HOSPITAL_BASED_OUTPATIENT_CLINIC_OR_DEPARTMENT_OTHER): Payer: Self-pay | Admitting: *Deleted

## 2019-02-14 DIAGNOSIS — Z951 Presence of aortocoronary bypass graft: Secondary | ICD-10-CM | POA: Diagnosis not present

## 2019-02-14 DIAGNOSIS — I2510 Atherosclerotic heart disease of native coronary artery without angina pectoris: Secondary | ICD-10-CM | POA: Insufficient documentation

## 2019-02-14 DIAGNOSIS — Z7982 Long term (current) use of aspirin: Secondary | ICD-10-CM | POA: Insufficient documentation

## 2019-02-14 DIAGNOSIS — E039 Hypothyroidism, unspecified: Secondary | ICD-10-CM | POA: Insufficient documentation

## 2019-02-14 DIAGNOSIS — I10 Essential (primary) hypertension: Secondary | ICD-10-CM | POA: Diagnosis not present

## 2019-02-14 DIAGNOSIS — Z955 Presence of coronary angioplasty implant and graft: Secondary | ICD-10-CM | POA: Insufficient documentation

## 2019-02-14 DIAGNOSIS — I252 Old myocardial infarction: Secondary | ICD-10-CM | POA: Diagnosis not present

## 2019-02-14 DIAGNOSIS — Z79899 Other long term (current) drug therapy: Secondary | ICD-10-CM | POA: Diagnosis not present

## 2019-02-14 DIAGNOSIS — M79661 Pain in right lower leg: Secondary | ICD-10-CM | POA: Diagnosis not present

## 2019-02-14 DIAGNOSIS — G309 Alzheimer's disease, unspecified: Secondary | ICD-10-CM | POA: Insufficient documentation

## 2019-02-14 DIAGNOSIS — M7989 Other specified soft tissue disorders: Secondary | ICD-10-CM | POA: Diagnosis not present

## 2019-02-14 DIAGNOSIS — R252 Cramp and spasm: Secondary | ICD-10-CM | POA: Insufficient documentation

## 2019-02-14 DIAGNOSIS — R2241 Localized swelling, mass and lump, right lower limb: Secondary | ICD-10-CM | POA: Insufficient documentation

## 2019-02-14 DIAGNOSIS — M79604 Pain in right leg: Secondary | ICD-10-CM | POA: Diagnosis not present

## 2019-02-14 NOTE — Discharge Instructions (Addendum)
Your ultrasound did not show any evidence of a blood clot in your leg today.   However, if you are continuing to notice swelling or pain to the leg or if you develop discoloration to your leg then you need to complete a repeat ultrasound in 5 days to recheck for a blood clot.   You should make an appointment with your doctor later this week to follow up about this. Return to the emergency room immediately for any new or worsening symptoms including those mentioned above. Also return immediately for any chest pain or shortness of breath.

## 2019-02-14 NOTE — ED Notes (Signed)
Patient transported to Ultrasound 

## 2019-02-14 NOTE — ED Triage Notes (Signed)
Pt c/o right leg swelling x 3 weeks, sent here from PMD office for r/o DVT

## 2019-02-14 NOTE — ED Provider Notes (Signed)
[facility]ERGENCY DEPARTMENT Provider Note   CSN: [id number] Arrival date & time: 02/14/19  1149    History   Chief Complaint Chief Complaint  Patient presents with  . Leg Swelling    HPI Isaac [name] is a 85 y.o. male.     HPI   Pt is a 85 y/o male with a h/o alzheimer's, carotid stenosis, CAD, diverticulitis, HLD, HTN, who presents to the ED today for evalaution of RLE swellign that has been present for the last 3 weeks. States he has had mild cramping in the right calf for the same time period. Has increased calf pain and night and has had to take a pain pill which improves sxs. Denies any injury or discoloration to hte RLE. No CP or SOB. No recent travel. No recent surgery or hospital admissions. No cough or hemoptysis. No recent extended periods of travel. Denies hormone use, personal hx of cancer, or hx of DVT/PE.    Past Medical History:  Diagnosis Date  . Alzheimer's dementia ([place])   . Angina pectoris ([place])   . Carotid stenosis    a. Carotid US 9/16:  Bilateral ICA 1-39%  . Coronary artery disease    a. s/p PCI in 2005 in [place] for MI; b. NSTEMI 9/16 >> LHC with 2 v CAD >> s/p CABG   . Dementia without behavioral disturbance ([place])   . Diverticulitis   . Hearing loss   . History of MI (myocardial infarction) 2005   treated in [place], [place]  . History of transesophageal echocardiography (TEE) for monitoring    a. Intra-Op TEE 9/16:  Mild LVH, EF 55-60%, normal wall motion, mild MR, no PFO  . HLD (hyperlipidemia)   . Hypertension   . MI (myocardial infarction) ([place])   . Sciatica   . Sensorineural hearing loss (SNHL), bilateral    severe    Patient Active Problem List   Diagnosis Date Noted  . Postoperative state 09/23/2017  . Mild dementia ([place]) 04/03/2017  . Unstable angina pectoris ([place]) 04/02/2017  . Unstable angina ([place]) 04/02/2017  . Hypothyroidism 08/22/2015  . Senile dementia ([place]) 08/15/2015  . 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 S/P CABG (coronary artery bypass graft) 08/12/2015  . Fever 08/12/2015  . HCAP (healthcare-associated pneumonia) 08/12/2015  . HLD (hyperlipidemia) 08/12/2015  . Sciatica   . MI (myocardial infarction) ([place])   . CAD in native artery 08/07/2015  . Elevated troponin 08/01/2015  . NSTEMI (non-ST elevated myocardial infarction) ([place])   . Essential hypertension 07/06/2015  . Coronary artery disease due to lipid rich plaque 07/06/2015  . Old MI (myocardial infarction) 07/06/2015    Past Surgical History:  Procedure Laterality Date  . APPENDECTOMY    . CARDIAC CATHETERIZATION N/A 08/03/2015   Procedure: Left Heart Cath and Coronary Angiography;  Surgeon: Peter M Martinique, MD;  Location: [facility];  Service: Cardiovascular;  Laterality: N/A;  . CARDIAC CATHETERIZATION  08/03/2015   Procedure: Intravascular Pressure Wire/FFR Study;  Surgeon: Peter M Martinique, MD;  Location: [facility];  Service: Cardiovascular;;  . CHOLECYSTECTOMY    . COCHLEAR IMPLANT Left 09/23/2017   Procedure: COCHLEAR IMPLANT;  Surgeon: Vicie Mutters, MD;  Location: [facility];  Service: ENT;  Laterality: Left;  . CORONARY ARTERY BYPASS GRAFT N/A 08/07/2015   Procedure: CORONARY ARTERY BYPASS GRAFTING (CABG) x 3 with Endoscopic Vein Harvesting of greater saphenous vein from bilateral thighs;  Surgeon: [name], MD;  Location: [facility];  Service:  Open Heart Surgery;  Laterality: N/A;  . CORONARY STENT PLACEMENT    . HERNIA REPAIR    . LEFT HEART CATH AND CORS/GRAFTS ANGIOGRAPHY N/A 04/02/2017   Procedure: Left Heart Cath and Cors/Grafts Angiography;  Surgeon: Jettie Booze, MD;  Location: [facility];  Service: Cardiovascular;  Laterality: N/A;  . TEE WITHOUT CARDIOVERSION N/A 08/07/2015   Procedure: TRANSESOPHAGEAL ECHOCARDIOGRAM (TEE);  Surgeon: [name], MD;  Location: [facility];  Service: Open Heart Surgery;  Laterality: N/A;  . TONSILLECTOMY          Home Medications    Prior to  Admission medications   Medication Sig Start Date End Date Taking? Authorizing Provider  Ascorbic Acid (VITAMIN C PO) Take by mouth.    [provider]  aspirin EC 81 MG tablet Take 81 mg by mouth daily.    [provider]  clopidogrel (PLAVIX) 75 MG tablet Take 75 mg by mouth daily.    [provider]  Cyanocobalamin (B-12 PO) Take 500 mg by mouth daily.    [provider]  donepezil (ARICEPT) 10 MG tablet Take 10 mg by mouth at bedtime.    [provider]  glucosamine-chondroitin 500-400 MG tablet Take 1 tablet by mouth 2 (two) times daily.     [provider]  levothyroxine (SYNTHROID, LEVOTHROID) 50 MCG tablet Take 50 mcg by mouth daily before breakfast.    [provider]  lisinopril (PRINIVIL,ZESTRIL) 5 MG tablet Take 1 tablet (5 mg total) by mouth daily. 09/19/15   Nani Skillern, PA-C  memantine (NAMENDA) 5 MG tablet Take 1 tablet (5 mg total) by mouth 2 (two) times daily. 10/04/18   Melvenia Beam, MD  metoprolol tartrate (LOPRESSOR) 25 MG tablet Take 1/2 tablet every AM by mouth and 1/4 tablet every PM by mouth daily Patient taking differently: Take 12.5 mg by mouth 2 (two) times daily.  04/06/17   Jerline Pain, MD  Omega-3 Fatty Acids (FISH OIL PO) Take by mouth.    [provider]  pravastatin (PRAVACHOL) 40 MG tablet Take 1 tablet (40 mg total) by mouth every evening. 04/06/17   Jerline Pain, MD    Family History Family History  Problem Relation Age of Onset  . Heart attack Father   . Pneumonia Mother   . Dementia Sister     Social History Social History   Tobacco Use  . Smoking status: Never Smoker  . Smokeless tobacco: Never Used  Substance Use Topics  . Alcohol use: No  . Drug use: No     Allergies   Patient has no known allergies.   Review of Systems Review of Systems  Constitutional: Negative for fever.  HENT: Negative for sore throat.   Eyes: Negative for visual  disturbance.  Respiratory: Negative for cough and shortness of breath.   Cardiovascular: Positive for leg swelling. Negative for chest pain.  Gastrointestinal: Negative for abdominal pain, constipation, diarrhea, nausea and vomiting.  Genitourinary: Negative for dysuria and hematuria.  Musculoskeletal: Negative for back pain.  Skin: Negative for color change.  Neurological: Negative for numbness and headaches.  All other systems reviewed and are negative.    Physical Exam Updated Vital Signs BP (!) 174/87   Pulse (!) 58   Temp 97.8 F (36.6 C)   Resp 18   Ht 5\' 9"  (1.753 m)   Wt 71.2 kg   SpO2 98%   BMI 23.18 kg/m   Physical Exam Vitals signs  and nursing note reviewed.  Constitutional:      General: He is not in acute distress.    Appearance: He is well-developed. He is not ill-appearing or toxic-appearing.  HENT:     Head: Normocephalic and atraumatic.  Eyes:     Conjunctiva/sclera: Conjunctivae normal.  Neck:     Musculoskeletal: Neck supple.  Cardiovascular:     Rate and Rhythm: Normal rate and regular rhythm.     Pulses: Normal pulses.     Heart sounds: Normal heart sounds. No murmur.  Pulmonary:     Effort: Pulmonary effort is normal. No respiratory distress.     Breath sounds: Normal breath sounds. No stridor. No wheezing, rhonchi or rales.  Abdominal:     General: Bowel sounds are normal.     Palpations: Abdomen is soft.     Tenderness: There is no abdominal tenderness.  Musculoskeletal:     Right lower leg: Edema (2+) present.     Left lower leg: No edema.     Comments: Very minimal erythema to the lower calf. No warmth. No evidence of cellulitis. Mild ttp to the right calf.   Skin:    General: Skin is warm and dry.  Neurological:     Mental Status: He is alert.      ED Treatments / Results  Labs (all labs ordered are listed, but only abnormal results are displayed) Labs Reviewed - No data to display  EKG None  Radiology US Venous Img Lower  Right (dvt Study)  Result Date: 02/14/2019 CLINICAL DATA:  Cramping in bed, mild swelling and redness EXAM: RIGHT LOWER EXTREMITY VENOUS DOPPLER ULTRASOUND TECHNIQUE: Gray-scale sonography with compression, as well as color and duplex ultrasound, were performed to evaluate the deep venous system from the level of the common femoral vein through the popliteal and proximal calf veins. COMPARISON:  None FINDINGS: Normal compressibility of the common femoral, superficial femoral, and popliteal veins, as well as the proximal calf veins. No filling defects to suggest DVT on grayscale or color Doppler imaging. Doppler waveforms show normal direction of venous flow, normal respiratory phasicity and response to augmentation. Visualized segments of the saphenous venous system normal in caliber and compressibility. Elongated 4.1 x 1 x 4 cm fluid collection in the posterior popliteal fossa. Survey views of the contralateral common femoral vein are unremarkable. IMPRESSION: 1. No femoropopliteal and no calf DVT in the visualized calf veins. If clinical symptoms are inconsistent or if there are persistent or worsening symptoms, further imaging (possibly involving the iliac veins) may be warranted. 2. Right Baker's cyst. Electronically Signed   By: Lucrezia Europe M.D.   On: 02/14/2019 13:09    Procedures Procedures (including critical care time)  Medications Ordered in ED Medications - No data to display   Initial Impression / Assessment and Plan / ED Course  I have reviewed the triage vital signs and the nursing notes.  Pertinent labs & imaging results that were available during my care of the patient were reviewed by me and considered in my medical decision making (see chart for details).     Final Clinical Impressions(s) / ED Diagnoses   Final diagnoses:  Right leg swelling   Pt with RLE swelling and right calf cramping for the last several weeks. No known risk factors for DVT reported.   Does have RLE  edema and some mild calf ttp on exam. No evidence of cellulitis on exam. No discoloration of the RLE. Strong and symmetric DP pulses. Denies chest  pain or sob to suggest pulmonary embolus. Lungs CTAB. VS WNL without tachycardia or hypoxia.   LE [place] without evidence of DVT today.   Given clinical findings and negative [place] today, have discussed with patient that if he continues to have swelling and calf cramping he will need a repeat ultrasound in 5 days to reassess for DVT.   Have advised close f/u with pcp in regards to this, he voices understanding of the plan and reasons to return to the ED. Utilized teach back method and pt does exhibit understanding of reasons to return to the ED. All questions answered, pt stable for d/c.  Discussed case with Dr. Rex Kras who is in agreement with plan.   ED Discharge Orders    None       Bishop Dublin 02/14/19 1348    Little, Wenda Overland, MD 02/14/19 1440

## 2019-02-14 NOTE — ED Notes (Signed)
Rt lower leg and ankle swelling  Denies pain

## 2019-02-26 IMAGING — MR MR HEAD WO/W CM
10 of 13 series · 34 of 48 positions shown · IV contrast (Yes   MULTIHANCE)
Comparison: None.

CLINICAL DATA: Cognitive impairment.  Dementia.  Cochlear implant.

EXAM:
MRI HEAD WITHOUT AND WITH CONTRAST
TECHNIQUE: Multiplanar, multiecho pulse sequences of the brain and surrounding
structures were obtained without and with intravenous contrast.
CONTRAST:  7.5 cc Gadavist

[Series 3: DWI · axial · 3.0mm · 1.09mm/px · z∈[-77,+66]mm · 9 of 93 slices shown (1 of 4)]
[im 1/93]
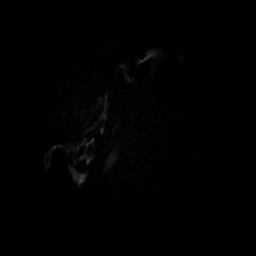
[im 12/93]
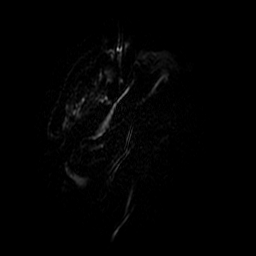
[im 24/93]
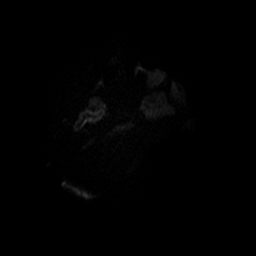
[im 35/93]
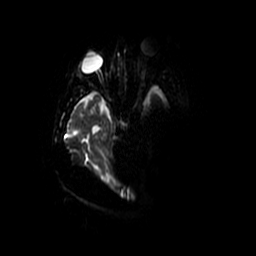
[im 47/93]
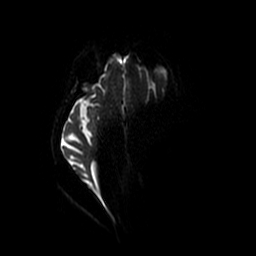
[im 58/93]
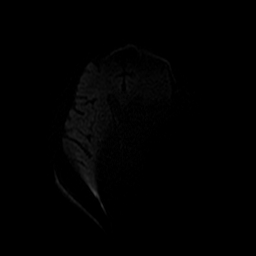
[im 70/93]
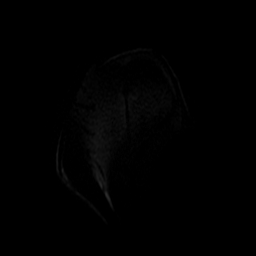
[im 81/93]
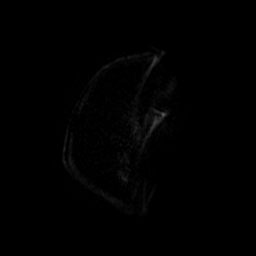
[im 93/93]
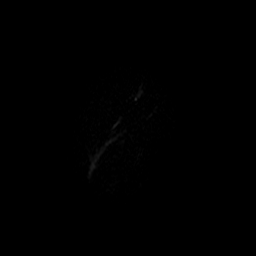

[Series 4: T1 · sagittal · 5.0mm · 0.47mm/px · 2 of 26 slices shown]
[im 1/26]
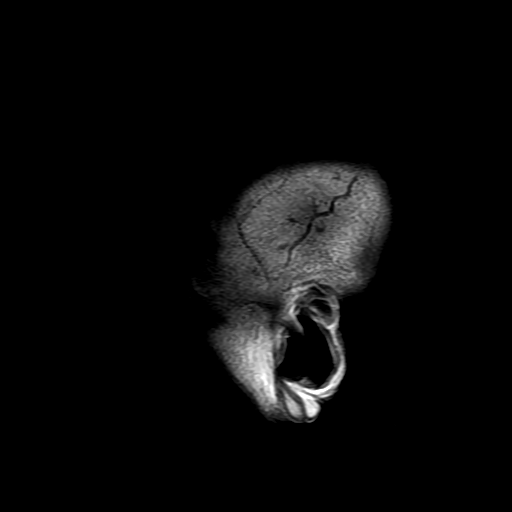
[im 26/26]
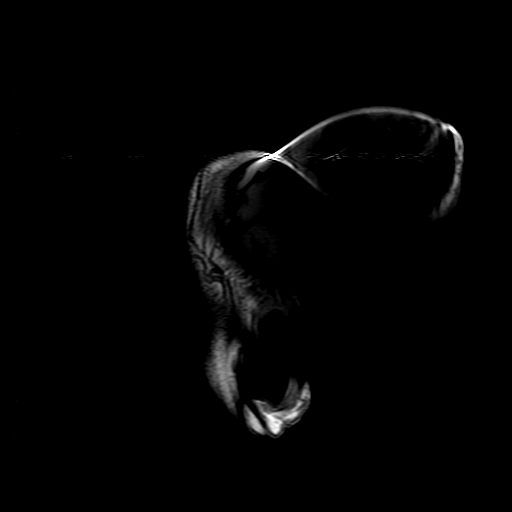

[Series 5: FLAIR · axial · 5.0mm · 0.43mm/px · z∈[-68,+69]mm · 2 of 24 slices shown]
[im 1/24]
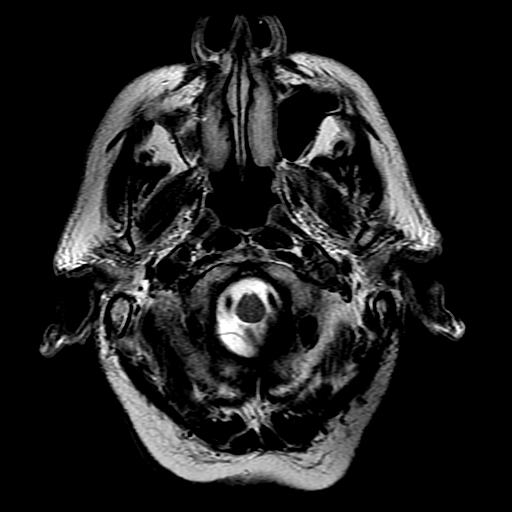
[im 24/24]
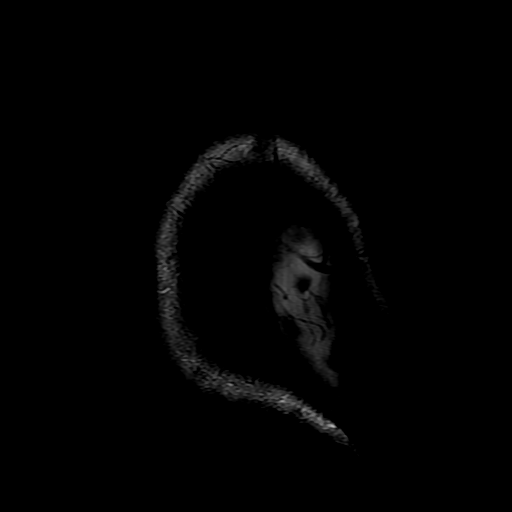

[Series 6: T2 · axial · 5.0mm · 0.43mm/px · z∈[-68,+69]mm · 2 of 24 slices shown]
[im 1/24]
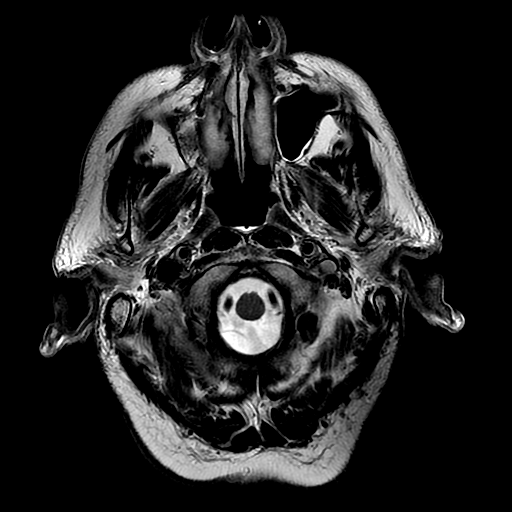
[im 24/24]
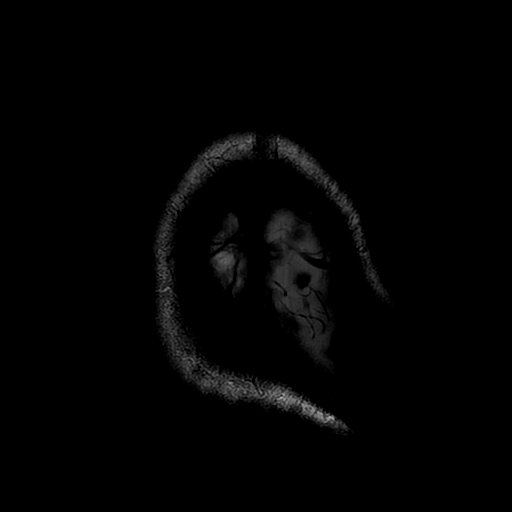

[Series 9: DWI · coronal · 5.0mm · 1.09mm/px · 6 of 70 slices shown (2 of 4)]
[im 1/70]
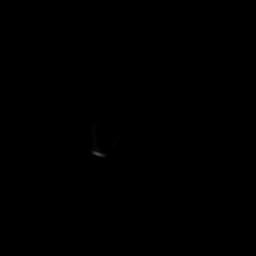
[im 14/70]
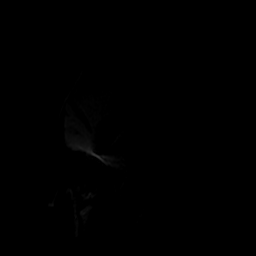
[im 28/70]
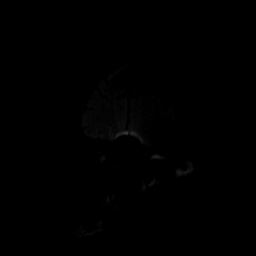
[im 42/70]
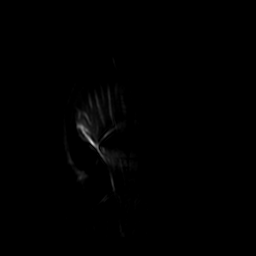
[im 56/70]
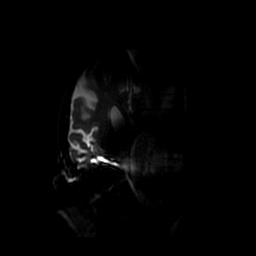
[im 70/70]
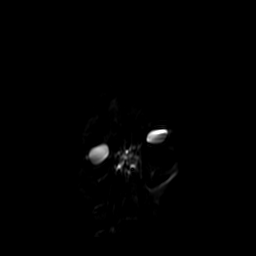

[Series 10: T2 post-contrast · coronal · 5.0mm · 0.39mm/px · 2 of 29 slices shown]
[im 1/29]
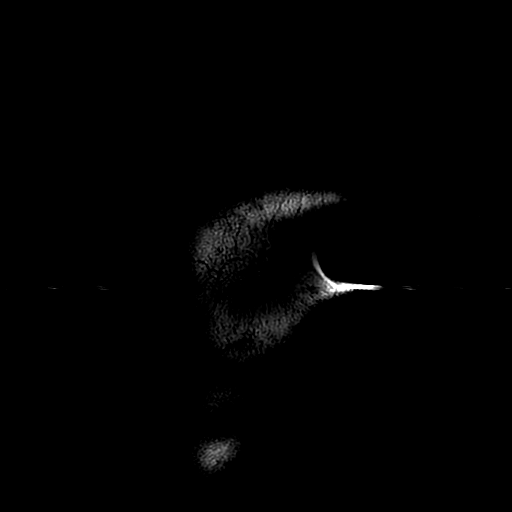
[im 29/29]
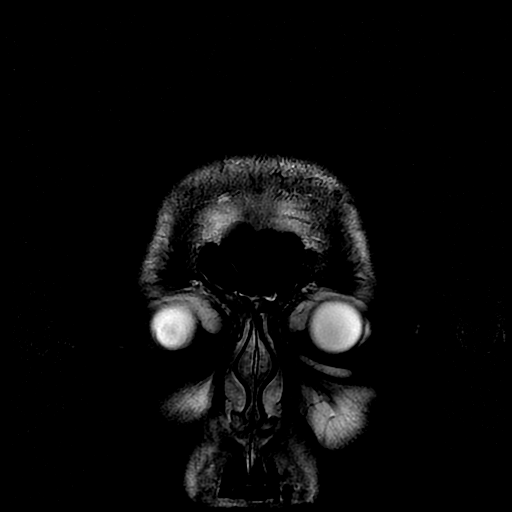

[Series 12: T1 post-contrast · coronal · 5.0mm · 0.39mm/px · 2 of 30 slices shown (1 of 2)]
[im 1/30]
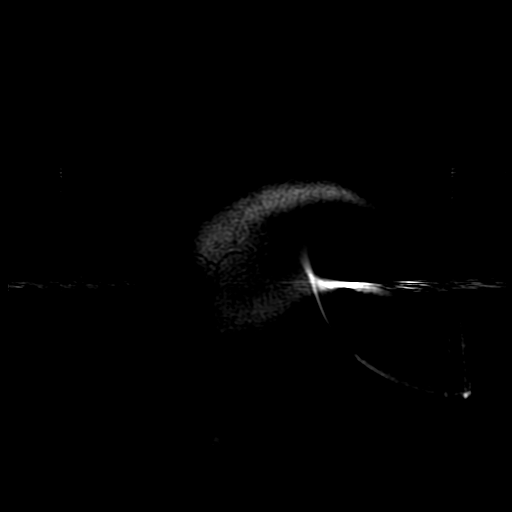
[im 30/30]
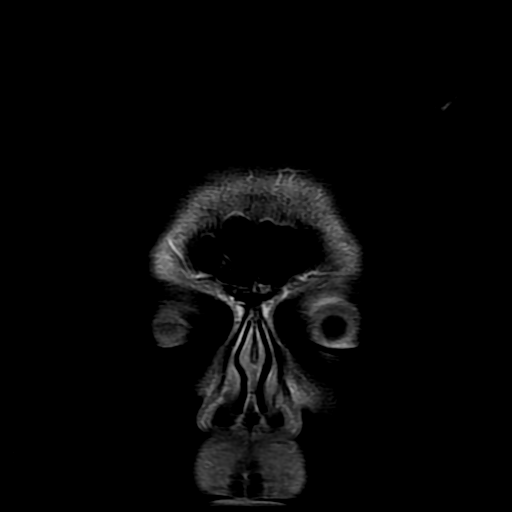

[Series 13: T1 post-contrast · sagittal · 5.0mm · 0.47mm/px · 2 of 30 slices shown (2 of 2)]
[im 1/30]
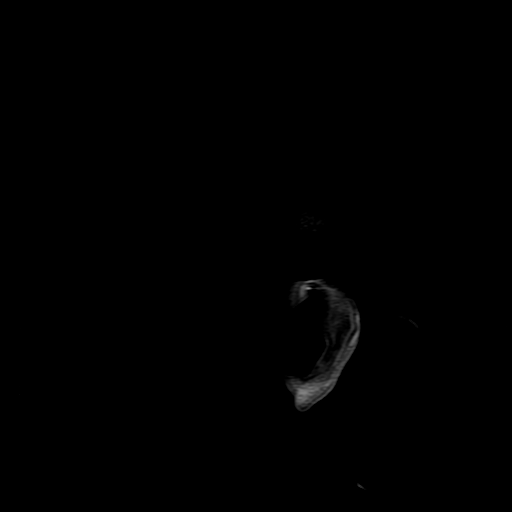
[im 30/30]
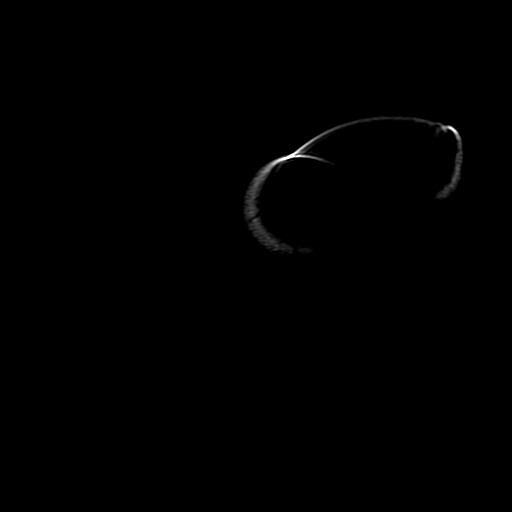

[Series 300: DWI · axial · 3.0mm · 1.09mm/px · z∈[-77,+66]mm · 4 of 49 slices shown (3 of 4)]
[im 1/49]
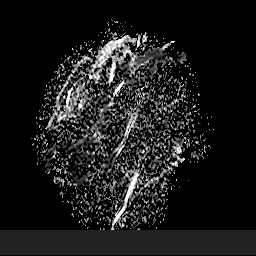
[im 17/49]
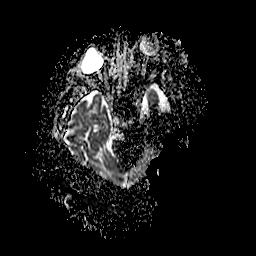
[im 33/49]
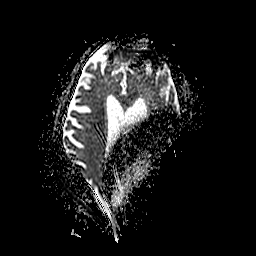
[im 49/49]
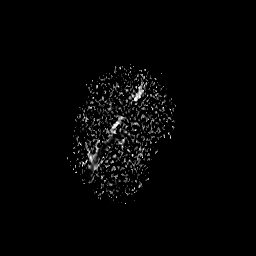

[Series 900: DWI · coronal · 5.0mm · 1.09mm/px · 3 of 35 slices shown (4 of 4)]
[im 1/35]
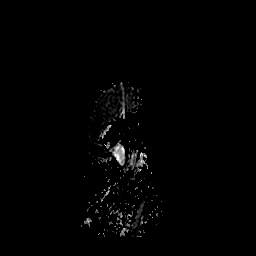
[im 18/35]
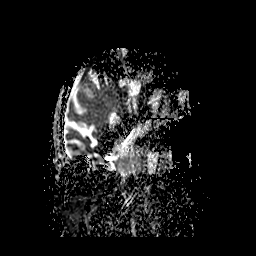
[im 35/35]
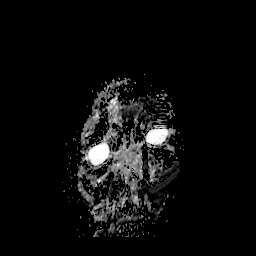

[34 of 48 positions shown; findings below may reference images not displayed]

FINDINGS: Brain: Considerable artifact secondary to the cochlear implant
device. There is generalized brain atrophy without lobar
predominance. As best as can be evaluated given the artifact, there
is mild chronic small-vessel change of the deep white matter. No
evidence of advanced microvascular change. No sign of large vessel
territory infarction. No mass lesion, hemorrhage, hydrocephalus or
extra-axial collection is seen. After contrast administration, no
abnormal enhancement occurs.

Vascular: Major vessels at the base of the brain show flow.

Skull and upper cervical spine: Otherwise negative

Sinuses/Orbits: Clear/normal

Other: None
IMPRESSION: Considerable artifact related to the cochlear implant device.

As best as can be evaluated, there is generalized brain atrophy
without lobar predominance. There are only mild small vessel changes
of the white matter.

## 2019-04-14 ENCOUNTER — Telehealth: Payer: Self-pay | Admitting: *Deleted

## 2019-04-14 NOTE — Telephone Encounter (Signed)
Received certificate of disability sheet from pt's son Zaide Kardell for Dr. Jaynee Eagles to sign indicating patient is incapacitated or disabled because of illness which has resulted in his inability to effectively manage his property or financial affairs. Pt has a living trust dated 02/27/95 which this is regarding. Also received copy of POA document and DPR form which has son Gerald Stabs on it. Dr. Jaynee Eagles reviewed and signed the certificate of disability and it was sent to medical records for processing.

## 2019-05-31 DIAGNOSIS — I10 Essential (primary) hypertension: Secondary | ICD-10-CM | POA: Diagnosis not present

## 2019-05-31 DIAGNOSIS — E7800 Pure hypercholesterolemia, unspecified: Secondary | ICD-10-CM | POA: Diagnosis not present

## 2019-05-31 DIAGNOSIS — Z0000 Encounter for general adult medical examination without abnormal findings: Secondary | ICD-10-CM | POA: Diagnosis not present

## 2019-07-04 DIAGNOSIS — I2510 Atherosclerotic heart disease of native coronary artery without angina pectoris: Secondary | ICD-10-CM | POA: Diagnosis not present

## 2019-07-04 DIAGNOSIS — G309 Alzheimer's disease, unspecified: Secondary | ICD-10-CM | POA: Diagnosis not present

## 2019-07-04 DIAGNOSIS — I209 Angina pectoris, unspecified: Secondary | ICD-10-CM | POA: Diagnosis not present

## 2019-07-04 DIAGNOSIS — E039 Hypothyroidism, unspecified: Secondary | ICD-10-CM | POA: Diagnosis not present

## 2019-07-04 DIAGNOSIS — E7800 Pure hypercholesterolemia, unspecified: Secondary | ICD-10-CM | POA: Diagnosis not present

## 2019-07-04 DIAGNOSIS — I10 Essential (primary) hypertension: Secondary | ICD-10-CM | POA: Diagnosis not present

## 2019-07-21 DIAGNOSIS — H353131 Nonexudative age-related macular degeneration, bilateral, early dry stage: Secondary | ICD-10-CM | POA: Diagnosis not present

## 2019-07-21 DIAGNOSIS — H524 Presbyopia: Secondary | ICD-10-CM | POA: Diagnosis not present

## 2019-08-16 DIAGNOSIS — Z23 Encounter for immunization: Secondary | ICD-10-CM | POA: Diagnosis not present

## 2019-09-15 DIAGNOSIS — Z48812 Encounter for surgical aftercare following surgery on the circulatory system: Secondary | ICD-10-CM | POA: Diagnosis not present

## 2019-09-15 DIAGNOSIS — R1312 Dysphagia, oropharyngeal phase: Secondary | ICD-10-CM | POA: Diagnosis not present

## 2019-09-15 DIAGNOSIS — M6281 Muscle weakness (generalized): Secondary | ICD-10-CM | POA: Diagnosis not present

## 2019-09-15 DIAGNOSIS — I2510 Atherosclerotic heart disease of native coronary artery without angina pectoris: Secondary | ICD-10-CM | POA: Diagnosis not present

## 2019-09-15 DIAGNOSIS — F0390 Unspecified dementia without behavioral disturbance: Secondary | ICD-10-CM | POA: Diagnosis not present

## 2019-09-15 DIAGNOSIS — R2681 Unsteadiness on feet: Secondary | ICD-10-CM | POA: Diagnosis not present

## 2019-09-15 DIAGNOSIS — M5430 Sciatica, unspecified side: Secondary | ICD-10-CM | POA: Diagnosis not present

## 2019-09-15 DIAGNOSIS — I10 Essential (primary) hypertension: Secondary | ICD-10-CM | POA: Diagnosis not present

## 2019-09-15 DIAGNOSIS — I25709 Atherosclerosis of coronary artery bypass graft(s), unspecified, with unspecified angina pectoris: Secondary | ICD-10-CM | POA: Diagnosis not present

## 2019-09-15 DIAGNOSIS — R41841 Cognitive communication deficit: Secondary | ICD-10-CM | POA: Diagnosis not present

## 2019-09-15 DIAGNOSIS — G309 Alzheimer's disease, unspecified: Secondary | ICD-10-CM | POA: Diagnosis not present

## 2019-09-15 DIAGNOSIS — R29898 Other symptoms and signs involving the musculoskeletal system: Secondary | ICD-10-CM | POA: Diagnosis not present

## 2019-09-15 DIAGNOSIS — H9190 Unspecified hearing loss, unspecified ear: Secondary | ICD-10-CM | POA: Diagnosis not present

## 2019-09-15 DIAGNOSIS — Z951 Presence of aortocoronary bypass graft: Secondary | ICD-10-CM | POA: Diagnosis not present

## 2019-09-22 DIAGNOSIS — I25709 Atherosclerosis of coronary artery bypass graft(s), unspecified, with unspecified angina pectoris: Secondary | ICD-10-CM | POA: Diagnosis not present

## 2019-09-22 DIAGNOSIS — M5430 Sciatica, unspecified side: Secondary | ICD-10-CM | POA: Diagnosis not present

## 2019-09-22 DIAGNOSIS — F0390 Unspecified dementia without behavioral disturbance: Secondary | ICD-10-CM | POA: Diagnosis not present

## 2019-09-22 DIAGNOSIS — I10 Essential (primary) hypertension: Secondary | ICD-10-CM | POA: Diagnosis not present

## 2019-09-22 DIAGNOSIS — I2510 Atherosclerotic heart disease of native coronary artery without angina pectoris: Secondary | ICD-10-CM | POA: Diagnosis not present

## 2019-09-22 DIAGNOSIS — R41841 Cognitive communication deficit: Secondary | ICD-10-CM | POA: Diagnosis not present

## 2019-09-26 DIAGNOSIS — R41841 Cognitive communication deficit: Secondary | ICD-10-CM | POA: Diagnosis not present

## 2019-09-26 DIAGNOSIS — R1312 Dysphagia, oropharyngeal phase: Secondary | ICD-10-CM | POA: Diagnosis not present

## 2019-09-26 DIAGNOSIS — Z951 Presence of aortocoronary bypass graft: Secondary | ICD-10-CM | POA: Diagnosis not present

## 2019-09-26 DIAGNOSIS — R29898 Other symptoms and signs involving the musculoskeletal system: Secondary | ICD-10-CM | POA: Diagnosis not present

## 2019-09-26 DIAGNOSIS — R2681 Unsteadiness on feet: Secondary | ICD-10-CM | POA: Diagnosis not present

## 2019-09-26 DIAGNOSIS — I2510 Atherosclerotic heart disease of native coronary artery without angina pectoris: Secondary | ICD-10-CM | POA: Diagnosis not present

## 2019-09-26 DIAGNOSIS — M5430 Sciatica, unspecified side: Secondary | ICD-10-CM | POA: Diagnosis not present

## 2019-09-26 DIAGNOSIS — Z48812 Encounter for surgical aftercare following surgery on the circulatory system: Secondary | ICD-10-CM | POA: Diagnosis not present

## 2019-09-26 DIAGNOSIS — I10 Essential (primary) hypertension: Secondary | ICD-10-CM | POA: Diagnosis not present

## 2019-09-26 DIAGNOSIS — M6281 Muscle weakness (generalized): Secondary | ICD-10-CM | POA: Diagnosis not present

## 2019-09-26 DIAGNOSIS — F0390 Unspecified dementia without behavioral disturbance: Secondary | ICD-10-CM | POA: Diagnosis not present

## 2019-09-26 DIAGNOSIS — H9190 Unspecified hearing loss, unspecified ear: Secondary | ICD-10-CM | POA: Diagnosis not present

## 2019-09-26 DIAGNOSIS — I25709 Atherosclerosis of coronary artery bypass graft(s), unspecified, with unspecified angina pectoris: Secondary | ICD-10-CM | POA: Diagnosis not present

## 2019-09-26 DIAGNOSIS — G309 Alzheimer's disease, unspecified: Secondary | ICD-10-CM | POA: Diagnosis not present

## 2019-09-28 DIAGNOSIS — I209 Angina pectoris, unspecified: Secondary | ICD-10-CM | POA: Diagnosis not present

## 2019-09-28 DIAGNOSIS — E039 Hypothyroidism, unspecified: Secondary | ICD-10-CM | POA: Diagnosis not present

## 2019-09-28 DIAGNOSIS — E7800 Pure hypercholesterolemia, unspecified: Secondary | ICD-10-CM | POA: Diagnosis not present

## 2019-09-28 DIAGNOSIS — I10 Essential (primary) hypertension: Secondary | ICD-10-CM | POA: Diagnosis not present

## 2019-09-28 DIAGNOSIS — I2510 Atherosclerotic heart disease of native coronary artery without angina pectoris: Secondary | ICD-10-CM | POA: Diagnosis not present

## 2019-09-28 DIAGNOSIS — G309 Alzheimer's disease, unspecified: Secondary | ICD-10-CM | POA: Diagnosis not present

## 2019-09-29 DIAGNOSIS — F0390 Unspecified dementia without behavioral disturbance: Secondary | ICD-10-CM | POA: Diagnosis not present

## 2019-09-29 DIAGNOSIS — R41841 Cognitive communication deficit: Secondary | ICD-10-CM | POA: Diagnosis not present

## 2019-09-29 DIAGNOSIS — M5430 Sciatica, unspecified side: Secondary | ICD-10-CM | POA: Diagnosis not present

## 2019-09-29 DIAGNOSIS — I2510 Atherosclerotic heart disease of native coronary artery without angina pectoris: Secondary | ICD-10-CM | POA: Diagnosis not present

## 2019-09-29 DIAGNOSIS — I25709 Atherosclerosis of coronary artery bypass graft(s), unspecified, with unspecified angina pectoris: Secondary | ICD-10-CM | POA: Diagnosis not present

## 2019-09-29 DIAGNOSIS — I10 Essential (primary) hypertension: Secondary | ICD-10-CM | POA: Diagnosis not present

## 2019-10-03 DIAGNOSIS — R41841 Cognitive communication deficit: Secondary | ICD-10-CM | POA: Diagnosis not present

## 2019-10-03 DIAGNOSIS — F0390 Unspecified dementia without behavioral disturbance: Secondary | ICD-10-CM | POA: Diagnosis not present

## 2019-10-03 DIAGNOSIS — I25709 Atherosclerosis of coronary artery bypass graft(s), unspecified, with unspecified angina pectoris: Secondary | ICD-10-CM | POA: Diagnosis not present

## 2019-10-03 DIAGNOSIS — M5430 Sciatica, unspecified side: Secondary | ICD-10-CM | POA: Diagnosis not present

## 2019-10-03 DIAGNOSIS — I10 Essential (primary) hypertension: Secondary | ICD-10-CM | POA: Diagnosis not present

## 2019-10-03 DIAGNOSIS — I2510 Atherosclerotic heart disease of native coronary artery without angina pectoris: Secondary | ICD-10-CM | POA: Diagnosis not present

## 2019-10-06 DIAGNOSIS — F0390 Unspecified dementia without behavioral disturbance: Secondary | ICD-10-CM | POA: Diagnosis not present

## 2019-10-06 DIAGNOSIS — R41841 Cognitive communication deficit: Secondary | ICD-10-CM | POA: Diagnosis not present

## 2019-10-06 DIAGNOSIS — I2510 Atherosclerotic heart disease of native coronary artery without angina pectoris: Secondary | ICD-10-CM | POA: Diagnosis not present

## 2019-10-06 DIAGNOSIS — I25709 Atherosclerosis of coronary artery bypass graft(s), unspecified, with unspecified angina pectoris: Secondary | ICD-10-CM | POA: Diagnosis not present

## 2019-10-06 DIAGNOSIS — M5430 Sciatica, unspecified side: Secondary | ICD-10-CM | POA: Diagnosis not present

## 2019-10-06 DIAGNOSIS — I10 Essential (primary) hypertension: Secondary | ICD-10-CM | POA: Diagnosis not present

## 2019-10-07 DIAGNOSIS — Z20828 Contact with and (suspected) exposure to other viral communicable diseases: Secondary | ICD-10-CM | POA: Diagnosis not present

## 2019-10-18 DIAGNOSIS — M6281 Muscle weakness (generalized): Secondary | ICD-10-CM | POA: Diagnosis not present

## 2019-10-18 DIAGNOSIS — I25709 Atherosclerosis of coronary artery bypass graft(s), unspecified, with unspecified angina pectoris: Secondary | ICD-10-CM | POA: Diagnosis not present

## 2019-10-18 DIAGNOSIS — R41841 Cognitive communication deficit: Secondary | ICD-10-CM | POA: Diagnosis not present

## 2019-10-18 DIAGNOSIS — M5430 Sciatica, unspecified side: Secondary | ICD-10-CM | POA: Diagnosis not present

## 2019-10-18 DIAGNOSIS — R1312 Dysphagia, oropharyngeal phase: Secondary | ICD-10-CM | POA: Diagnosis not present

## 2019-10-18 DIAGNOSIS — R29898 Other symptoms and signs involving the musculoskeletal system: Secondary | ICD-10-CM | POA: Diagnosis not present

## 2019-10-18 DIAGNOSIS — G309 Alzheimer's disease, unspecified: Secondary | ICD-10-CM | POA: Diagnosis not present

## 2019-10-18 DIAGNOSIS — H9190 Unspecified hearing loss, unspecified ear: Secondary | ICD-10-CM | POA: Diagnosis not present

## 2019-10-18 DIAGNOSIS — R2681 Unsteadiness on feet: Secondary | ICD-10-CM | POA: Diagnosis not present

## 2019-10-18 DIAGNOSIS — N3946 Mixed incontinence: Secondary | ICD-10-CM | POA: Diagnosis not present

## 2019-10-18 DIAGNOSIS — Z951 Presence of aortocoronary bypass graft: Secondary | ICD-10-CM | POA: Diagnosis not present

## 2019-10-18 DIAGNOSIS — Z48812 Encounter for surgical aftercare following surgery on the circulatory system: Secondary | ICD-10-CM | POA: Diagnosis not present

## 2019-10-19 DIAGNOSIS — M5430 Sciatica, unspecified side: Secondary | ICD-10-CM | POA: Diagnosis not present

## 2019-10-19 DIAGNOSIS — N3946 Mixed incontinence: Secondary | ICD-10-CM | POA: Diagnosis not present

## 2019-10-19 DIAGNOSIS — R41841 Cognitive communication deficit: Secondary | ICD-10-CM | POA: Diagnosis not present

## 2019-10-19 DIAGNOSIS — R2681 Unsteadiness on feet: Secondary | ICD-10-CM | POA: Diagnosis not present

## 2019-10-19 DIAGNOSIS — I25709 Atherosclerosis of coronary artery bypass graft(s), unspecified, with unspecified angina pectoris: Secondary | ICD-10-CM | POA: Diagnosis not present

## 2019-10-19 DIAGNOSIS — M6281 Muscle weakness (generalized): Secondary | ICD-10-CM | POA: Diagnosis not present

## 2019-10-24 DIAGNOSIS — M5430 Sciatica, unspecified side: Secondary | ICD-10-CM | POA: Diagnosis not present

## 2019-10-24 DIAGNOSIS — I25709 Atherosclerosis of coronary artery bypass graft(s), unspecified, with unspecified angina pectoris: Secondary | ICD-10-CM | POA: Diagnosis not present

## 2019-10-24 DIAGNOSIS — R2681 Unsteadiness on feet: Secondary | ICD-10-CM | POA: Diagnosis not present

## 2019-10-24 DIAGNOSIS — N3946 Mixed incontinence: Secondary | ICD-10-CM | POA: Diagnosis not present

## 2019-10-24 DIAGNOSIS — M6281 Muscle weakness (generalized): Secondary | ICD-10-CM | POA: Diagnosis not present

## 2019-10-24 DIAGNOSIS — R41841 Cognitive communication deficit: Secondary | ICD-10-CM | POA: Diagnosis not present

## 2019-10-25 DIAGNOSIS — Z951 Presence of aortocoronary bypass graft: Secondary | ICD-10-CM | POA: Diagnosis not present

## 2019-10-25 DIAGNOSIS — M5430 Sciatica, unspecified side: Secondary | ICD-10-CM | POA: Diagnosis not present

## 2019-10-25 DIAGNOSIS — R41841 Cognitive communication deficit: Secondary | ICD-10-CM | POA: Diagnosis not present

## 2019-10-25 DIAGNOSIS — N3946 Mixed incontinence: Secondary | ICD-10-CM | POA: Diagnosis not present

## 2019-10-25 DIAGNOSIS — R1312 Dysphagia, oropharyngeal phase: Secondary | ICD-10-CM | POA: Diagnosis not present

## 2019-10-25 DIAGNOSIS — G309 Alzheimer's disease, unspecified: Secondary | ICD-10-CM | POA: Diagnosis not present

## 2019-10-25 DIAGNOSIS — H9190 Unspecified hearing loss, unspecified ear: Secondary | ICD-10-CM | POA: Diagnosis not present

## 2019-10-25 DIAGNOSIS — R29898 Other symptoms and signs involving the musculoskeletal system: Secondary | ICD-10-CM | POA: Diagnosis not present

## 2019-10-25 DIAGNOSIS — Z48812 Encounter for surgical aftercare following surgery on the circulatory system: Secondary | ICD-10-CM | POA: Diagnosis not present

## 2019-10-25 DIAGNOSIS — R2681 Unsteadiness on feet: Secondary | ICD-10-CM | POA: Diagnosis not present

## 2019-10-25 DIAGNOSIS — M6281 Muscle weakness (generalized): Secondary | ICD-10-CM | POA: Diagnosis not present

## 2019-10-26 DIAGNOSIS — R1312 Dysphagia, oropharyngeal phase: Secondary | ICD-10-CM | POA: Diagnosis not present

## 2019-10-26 DIAGNOSIS — R41841 Cognitive communication deficit: Secondary | ICD-10-CM | POA: Diagnosis not present

## 2019-10-26 DIAGNOSIS — R2681 Unsteadiness on feet: Secondary | ICD-10-CM | POA: Diagnosis not present

## 2019-10-26 DIAGNOSIS — M5430 Sciatica, unspecified side: Secondary | ICD-10-CM | POA: Diagnosis not present

## 2019-10-26 DIAGNOSIS — N3946 Mixed incontinence: Secondary | ICD-10-CM | POA: Diagnosis not present

## 2019-10-26 DIAGNOSIS — M6281 Muscle weakness (generalized): Secondary | ICD-10-CM | POA: Diagnosis not present

## 2019-10-27 DIAGNOSIS — N3946 Mixed incontinence: Secondary | ICD-10-CM | POA: Diagnosis not present

## 2019-10-27 DIAGNOSIS — R41841 Cognitive communication deficit: Secondary | ICD-10-CM | POA: Diagnosis not present

## 2019-10-27 DIAGNOSIS — R1312 Dysphagia, oropharyngeal phase: Secondary | ICD-10-CM | POA: Diagnosis not present

## 2019-10-27 DIAGNOSIS — R2681 Unsteadiness on feet: Secondary | ICD-10-CM | POA: Diagnosis not present

## 2019-10-27 DIAGNOSIS — M5430 Sciatica, unspecified side: Secondary | ICD-10-CM | POA: Diagnosis not present

## 2019-10-27 DIAGNOSIS — M6281 Muscle weakness (generalized): Secondary | ICD-10-CM | POA: Diagnosis not present

## 2019-10-31 DIAGNOSIS — R41841 Cognitive communication deficit: Secondary | ICD-10-CM | POA: Diagnosis not present

## 2019-10-31 DIAGNOSIS — M6281 Muscle weakness (generalized): Secondary | ICD-10-CM | POA: Diagnosis not present

## 2019-10-31 DIAGNOSIS — R2681 Unsteadiness on feet: Secondary | ICD-10-CM | POA: Diagnosis not present

## 2019-10-31 DIAGNOSIS — R1312 Dysphagia, oropharyngeal phase: Secondary | ICD-10-CM | POA: Diagnosis not present

## 2019-10-31 DIAGNOSIS — M5430 Sciatica, unspecified side: Secondary | ICD-10-CM | POA: Diagnosis not present

## 2019-10-31 DIAGNOSIS — N3946 Mixed incontinence: Secondary | ICD-10-CM | POA: Diagnosis not present

## 2019-11-01 DIAGNOSIS — R2681 Unsteadiness on feet: Secondary | ICD-10-CM | POA: Diagnosis not present

## 2019-11-01 DIAGNOSIS — M6281 Muscle weakness (generalized): Secondary | ICD-10-CM | POA: Diagnosis not present

## 2019-11-01 DIAGNOSIS — N3946 Mixed incontinence: Secondary | ICD-10-CM | POA: Diagnosis not present

## 2019-11-01 DIAGNOSIS — M5430 Sciatica, unspecified side: Secondary | ICD-10-CM | POA: Diagnosis not present

## 2019-11-01 DIAGNOSIS — R1312 Dysphagia, oropharyngeal phase: Secondary | ICD-10-CM | POA: Diagnosis not present

## 2019-11-01 DIAGNOSIS — R41841 Cognitive communication deficit: Secondary | ICD-10-CM | POA: Diagnosis not present

## 2019-11-02 DIAGNOSIS — N3946 Mixed incontinence: Secondary | ICD-10-CM | POA: Diagnosis not present

## 2019-11-02 DIAGNOSIS — R1312 Dysphagia, oropharyngeal phase: Secondary | ICD-10-CM | POA: Diagnosis not present

## 2019-11-02 DIAGNOSIS — R2681 Unsteadiness on feet: Secondary | ICD-10-CM | POA: Diagnosis not present

## 2019-11-02 DIAGNOSIS — M5430 Sciatica, unspecified side: Secondary | ICD-10-CM | POA: Diagnosis not present

## 2019-11-02 DIAGNOSIS — R41841 Cognitive communication deficit: Secondary | ICD-10-CM | POA: Diagnosis not present

## 2019-11-02 DIAGNOSIS — M6281 Muscle weakness (generalized): Secondary | ICD-10-CM | POA: Diagnosis not present

## 2019-11-03 ENCOUNTER — Encounter: Payer: Medicare Other | Admitting: Nurse Practitioner

## 2019-11-03 ENCOUNTER — Non-Acute Institutional Stay: Payer: Medicare Other | Admitting: Nurse Practitioner

## 2019-11-03 ENCOUNTER — Encounter: Payer: Self-pay | Admitting: Nurse Practitioner

## 2019-11-03 ENCOUNTER — Other Ambulatory Visit: Payer: Self-pay

## 2019-11-03 DIAGNOSIS — R1312 Dysphagia, oropharyngeal phase: Secondary | ICD-10-CM | POA: Diagnosis not present

## 2019-11-03 DIAGNOSIS — F0390 Unspecified dementia without behavioral disturbance: Secondary | ICD-10-CM

## 2019-11-03 DIAGNOSIS — R41841 Cognitive communication deficit: Secondary | ICD-10-CM | POA: Diagnosis not present

## 2019-11-03 DIAGNOSIS — I10 Essential (primary) hypertension: Secondary | ICD-10-CM

## 2019-11-03 DIAGNOSIS — E039 Hypothyroidism, unspecified: Secondary | ICD-10-CM | POA: Diagnosis not present

## 2019-11-03 DIAGNOSIS — E7800 Pure hypercholesterolemia, unspecified: Secondary | ICD-10-CM

## 2019-11-03 DIAGNOSIS — F339 Major depressive disorder, recurrent, unspecified: Secondary | ICD-10-CM

## 2019-11-03 DIAGNOSIS — I2510 Atherosclerotic heart disease of native coronary artery without angina pectoris: Secondary | ICD-10-CM

## 2019-11-03 DIAGNOSIS — F03A0 Unspecified dementia, mild, without behavioral disturbance, psychotic disturbance, mood disturbance, and anxiety: Secondary | ICD-10-CM

## 2019-11-03 DIAGNOSIS — R2681 Unsteadiness on feet: Secondary | ICD-10-CM | POA: Diagnosis not present

## 2019-11-03 DIAGNOSIS — M6281 Muscle weakness (generalized): Secondary | ICD-10-CM | POA: Diagnosis not present

## 2019-11-03 DIAGNOSIS — N3946 Mixed incontinence: Secondary | ICD-10-CM | POA: Diagnosis not present

## 2019-11-03 DIAGNOSIS — M5430 Sciatica, unspecified side: Secondary | ICD-10-CM | POA: Diagnosis not present

## 2019-11-03 DIAGNOSIS — E538 Deficiency of other specified B group vitamins: Secondary | ICD-10-CM

## 2019-11-03 NOTE — Progress Notes (Addendum)
Location:   [facility] Room Number: [place] of Service:  ALF [phone]) Provider:  Amedee Cerrone NP  Lawerance Cruel, MD  Patient Care Team: Lawerance Cruel, MD as PCP - General (Family Medicine) Vicie Mutters, MD as Consulting Physician (Otolaryngology)  Extended Emergency Contact Information Primary Emergency Contact: Moise,[name] Address: [address]          [place], [place] [address] Johnnette Litter of [place] Phone: [phone] Relation: Spouse Secondary Emergency Contact: monti, tashjian Mobile Phone: [phone] Relation: Son  Code Status:  Full Code Goals of care: Advanced Directive information Advanced Directives 11/23/2019  Does Patient Have a Medical Advance Directive? Yes  Type of Paramedic of [place];Living will  Does patient want to make changes to medical advance directive? No - Patient declined  Copy of [place] in Chart? Yes - validated most recent copy scanned in chart (See row information)  Would patient like information on creating a medical advance directive? -     Chief Complaint  Patient presents with  . Medical Management of Chronic Issues  . Health Maintenance    TDAP, PCV13    HPI:  Pt is a 85 y.o. male seen today for medical management of chronic diseases.    New patient establishment for [facility]  The patient resides in [facility] for safety, care assistance, on Memantine 5mg  qd,  Deonepzil 10mg  qd for memory. His mood is stable, on Sertraline 25mg  qd. HTN, CAD, blood pressure is controlled on Metoprolol 12.5mg  qd, 25mg  qd, Lisinopril 5mg  qd, Pravastatin 40mg  qd, ASA 81mg  qd.  Hypothyroidism, on Levothyroxine 50cg qd. On Vit B12(Vit B12 1313 10/04/18)   Past Medical History:  Diagnosis Date  . Alzheimer's dementia ([place])   . Angina pectoris ([place])   . Carotid stenosis    a. Carotid US 9/16:  Bilateral ICA 1-39%  . Coronary artery disease    a. s/p PCI in 2005 in [place] for MI; b.  NSTEMI 9/16 >> LHC with 2 v CAD >> s/p CABG   . Dementia without behavioral disturbance ([place])   . Diverticulitis   . Hearing loss   . History of MI (myocardial infarction) 2005   treated in [place], [place]  . History of transesophageal echocardiography (TEE) for monitoring    a. Intra-Op TEE 9/16:  Mild LVH, EF 55-60%, normal wall motion, mild MR, no PFO  . HLD (hyperlipidemia)   . Hypertension   . Lyme disease   . MI (myocardial infarction) ([place])   . Sciatica   . Sensorineural hearing loss (SNHL), bilateral    severe   Past Surgical History:  Procedure Laterality Date  . APPENDECTOMY    . CARDIAC CATHETERIZATION N/A 08/03/2015   Procedure: Left Heart Cath and Coronary Angiography;  Surgeon: Peter M Martinique, MD;  Location: [place] CV LAB;  Service: Cardiovascular;  Laterality: N/A;  . CARDIAC CATHETERIZATION  08/03/2015   Procedure: Intravascular Pressure Wire/FFR Study;  Surgeon: Peter M Martinique, MD;  Location: [place] CV LAB;  Service: Cardiovascular;;  . CHOLECYSTECTOMY    . COCHLEAR IMPLANT Left 09/23/2017   Procedure: COCHLEAR IMPLANT;  Surgeon: Vicie Mutters, MD;  Location: [facility];  Service: ENT;  Laterality: Left;  . CORONARY ARTERY BYPASS GRAFT N/A 08/07/2015   Procedure: CORONARY ARTERY BYPASS GRAFTING (CABG) x 3 with Endoscopic Vein Harvesting of greater saphenous vein from bilateral thighs;  Surgeon: [name], MD;  Location: [facility];  Service: Open Heart Surgery;  Laterality: N/A;  . CORONARY STENT PLACEMENT    . HERNIA REPAIR    . LEFT HEART CATH AND CORS/GRAFTS ANGIOGRAPHY N/A 04/02/2017   Procedure: Left Heart Cath and Cors/Grafts Angiography;  Surgeon: Jettie Booze, MD;  Location: [place] CV LAB;  Service: Cardiovascular;  Laterality: N/A;  . TEE WITHOUT CARDIOVERSION N/A 08/07/2015   Procedure: TRANSESOPHAGEAL ECHOCARDIOGRAM (TEE);  Surgeon: [name], MD;  Location: [place];  Service: Open Heart Surgery;  Laterality: N/A;  . TONSILLECTOMY       No Known Allergies  Allergies as of 11/03/2019   No Known Allergies     Medication List       Accurate as of November 03, 2019 11:59 PM. If you have any questions, ask your nurse or doctor.        aspirin EC 81 MG tablet Take 81 mg by mouth daily.   B-12 PO Take 2,500 mg by mouth daily.   clopidogrel 75 MG tablet Commonly known as: PLAVIX Take 75 mg by mouth daily.   donepezil 10 MG tablet Commonly known as: ARICEPT Take 10 mg by mouth at bedtime.   FISH OIL PO Take 1,400 mg by mouth daily.   GLUCOSAMINE-CHONDROITIN PO Take 1 tablet by mouth daily. 1500mg    levothyroxine 50 MCG tablet Commonly known as: SYNTHROID Take 50 mcg by mouth daily before breakfast.   lisinopril 5 MG tablet Commonly known as: ZESTRIL Take 1 tablet (5 mg total) by mouth daily.   Magnesium 250 MG Tabs Take 1 tablet by mouth daily.   memantine 5 MG tablet Commonly known as: NAMENDA Take 5 mg by mouth daily. What changed: Another medication with the same name was removed. Continue taking this medication, and follow the directions you see here. Changed by: Micha Erck X Osualdo Hansell, NP   metoprolol tartrate 25 MG tablet Commonly known as: LOPRESSOR Take 25 mg by mouth daily. What changed: Another medication with the same name was removed. Continue taking this medication, and follow the directions you see here. Changed by: Waylyn Tenbrink X Lurie Mullane, NP   metoprolol tartrate 25 MG tablet Commonly known as: LOPRESSOR Take 12.5 mg by mouth every morning. What changed: Another medication with the same name was removed. Continue taking this medication, and follow the directions you see here. Changed by: Sohrab Keelan X Denzel Etienne, NP   MULTIPLE VITAMINS-IRON PO Take 2,500 mg by mouth daily.   nitroGLYCERIN 0.4 MG SL tablet Commonly known as: NITROSTAT Place 0.4 mg under the tongue. Dissolve one tablet under the tongue every 5 minutes as needed for chest pain. Maximum of three tablets n 15 minutes. Call 911 if pain persists. As  needed   pravastatin 40 MG tablet Commonly known as: PRAVACHOL Take 1 tablet (40 mg total) by mouth every evening.   sertraline 25 MG tablet Commonly known as: ZOLOFT Take 25 mg by mouth daily.   VITAMIN C PO Take 500 mg by mouth daily.      ROS was provided with  Review of Systems  Constitutional: Negative for activity change, appetite change, chills, diaphoresis, fatigue, fever and unexpected weight change.  HENT: Positive for hearing loss. Negative for congestion and voice change.   Respiratory: Negative for cough, shortness of breath and wheezing.   Cardiovascular: Positive for leg swelling. Negative for chest pain and palpitations.  Gastrointestinal: Negative for abdominal distention, abdominal pain, constipation, diarrhea, nausea and vomiting.  Genitourinary: Negative for difficulty urinating, dysuria and urgency.  Musculoskeletal: Positive for arthralgias and gait problem.  Skin:  Negative for color change and pallor.  Neurological: Negative for dizziness, speech difficulty, weakness and headaches.       Memory lapses.   Psychiatric/Behavioral: Negative for agitation, behavioral problems, hallucinations and sleep disturbance. The patient is not nervous/anxious.     Immunization History  Administered Date(s) Administered  . Influenza, High Dose Seasonal PF 08/16/2019  . Influenza-Unspecified 08/30/2015   Pertinent  Health Maintenance Due  Topic Date Due  . PNA vac Low Risk Adult (1 of 2 - PCV13) 05/27/1993  . INFLUENZA VACCINE  Completed   Fall Risk  08/20/2015  Falls in the past year? No   Functional Status Survey:    Vitals:   11/03/19 1316  BP: 132/70  Pulse: (!) 55  Resp: 18  Temp: 98 F (36.7 C)  SpO2: 96%  Weight: 156 lb (70.8 kg)  Height: 5\' 9"  (1.753 m)   Body mass index is 23.04 kg/m. Physical Exam Vitals and nursing note reviewed.  Constitutional:      General: He is not in acute distress.    Appearance: Normal appearance. He is not  ill-appearing, toxic-appearing or diaphoretic.  HENT:     Head: Normocephalic and atraumatic.     Nose: Nose normal.     Mouth/Throat:     Mouth: Mucous membranes are moist.  Eyes:     Extraocular Movements: Extraocular movements intact.     Conjunctiva/sclera: Conjunctivae normal.     Pupils: Pupils are equal, round, and reactive to light.  Cardiovascular:     Rate and Rhythm: Normal rate and regular rhythm.     Heart sounds: No murmur.  Pulmonary:     Breath sounds: No wheezing, rhonchi or rales.  Abdominal:     General: Bowel sounds are normal. There is no distension.     Palpations: Abdomen is soft.     Tenderness: There is no abdominal tenderness. There is no right CVA tenderness, left CVA tenderness, guarding or rebound.  Musculoskeletal:     Cervical back: Normal range of motion and neck supple.     Right lower leg: No edema.     Left lower leg: No edema.     Comments: R Baker's cyst.   Skin:    General: Skin is warm and dry.     Comments: Mid sternal surgical scar  Neurological:     General: No focal deficit present.     Mental Status: He is alert. Mental status is at baseline.     Motor: No weakness.     Coordination: Coordination normal.     Gait: Gait abnormal.     Comments: Oriented to person, place.   Psychiatric:        Mood and Affect: Mood normal.        Behavior: Behavior normal.        Thought Content: Thought content normal.     Labs reviewed: No results for input(s): NA, K, CL, CO2, GLUCOSE, BUN, CREATININE, CALCIUM, MG, PHOS in the last 8760 hours. No results for input(s): AST, ALT, ALKPHOS, BILITOT, PROT, ALBUMIN in the last 8760 hours. No results for input(s): WBC, NEUTROABS, HGB, HCT, MCV, PLT in the last 8760 hours. Lab Results  Component Value Date   TSH 7.23 (A) 08/21/2015   Lab Results  Component Value Date   HGBA1C 5.7 (H) 08/06/2015   Lab Results  Component Value Date   CHOL 133 08/02/2015   HDL 42 08/02/2015   LDLCALC 78  08/02/2015   TRIG 64 08/02/2015  CHOLHDL 3.2 08/02/2015    Significant Diagnostic Results in last 30 days:  No results found.  Assessment/Plan CAD in native artery S/p CABG 08/07/2015, EF 55-60%, continue ASA, Metoprolol, Lisinopril, Pravastatin. Prn NTG  Essential hypertension Blood pressure is controlled, continue Metoprolol, Lisinopril 5mg  qd. Update CMP  Hypothyroidism Continue Levothyroxine 103mcg qd, update TSH.   Mild dementia Continue [facility] for safety, care assistance, continue Mementine, Donepezil.   HLD (hyperlipidemia) Continue Pravastatin, update lipid panel.   Vitamin B12 deficiency Continue Vit B12, update Vit B12 level, CBC  Depression, recurrent (HCC) His moos is stable, continue Sertraline.      Family/ staff Communication: plan of care reviewed with the patient and charge nurse.   Labs/tests ordered: CBC, CMP, TSH, Vit B12, lipid panel.   Time spend 40 minutes

## 2019-11-06 ENCOUNTER — Encounter: Payer: Self-pay | Admitting: Nurse Practitioner

## 2019-11-06 DIAGNOSIS — F339 Major depressive disorder, recurrent, unspecified: Secondary | ICD-10-CM | POA: Insufficient documentation

## 2019-11-06 DIAGNOSIS — E538 Deficiency of other specified B group vitamins: Secondary | ICD-10-CM | POA: Insufficient documentation

## 2019-11-06 NOTE — Assessment & Plan Note (Addendum)
Blood pressure is controlled, continue Metoprolol, Lisinopril 5mg  qd. Update CMP

## 2019-11-06 NOTE — Assessment & Plan Note (Signed)
Continue Levothyroxine 41mcg qd, update TSH.

## 2019-11-06 NOTE — Assessment & Plan Note (Signed)
Continue Pravastatin, update lipid panel.

## 2019-11-06 NOTE — Assessment & Plan Note (Addendum)
S/p CABG 08/07/2015, EF 55-60%, continue ASA, Metoprolol, Lisinopril, Pravastatin. Prn NTG

## 2019-11-06 NOTE — Assessment & Plan Note (Signed)
His moos is stable, continue Sertraline.

## 2019-11-06 NOTE — Assessment & Plan Note (Signed)
Continue [facility] for safety, care assistance, continue Mementine, Donepezil.

## 2019-11-06 NOTE — Assessment & Plan Note (Addendum)
Continue Vit B12, update Vit B12 level, CBC

## 2019-11-07 DIAGNOSIS — N3946 Mixed incontinence: Secondary | ICD-10-CM | POA: Diagnosis not present

## 2019-11-07 DIAGNOSIS — R1312 Dysphagia, oropharyngeal phase: Secondary | ICD-10-CM | POA: Diagnosis not present

## 2019-11-07 DIAGNOSIS — M5430 Sciatica, unspecified side: Secondary | ICD-10-CM | POA: Diagnosis not present

## 2019-11-07 DIAGNOSIS — R41841 Cognitive communication deficit: Secondary | ICD-10-CM | POA: Diagnosis not present

## 2019-11-07 DIAGNOSIS — R2681 Unsteadiness on feet: Secondary | ICD-10-CM | POA: Diagnosis not present

## 2019-11-07 DIAGNOSIS — M6281 Muscle weakness (generalized): Secondary | ICD-10-CM | POA: Diagnosis not present

## 2019-11-08 DIAGNOSIS — R2681 Unsteadiness on feet: Secondary | ICD-10-CM | POA: Diagnosis not present

## 2019-11-08 DIAGNOSIS — Z20828 Contact with and (suspected) exposure to other viral communicable diseases: Secondary | ICD-10-CM | POA: Diagnosis not present

## 2019-11-08 DIAGNOSIS — R1312 Dysphagia, oropharyngeal phase: Secondary | ICD-10-CM | POA: Diagnosis not present

## 2019-11-08 DIAGNOSIS — M5430 Sciatica, unspecified side: Secondary | ICD-10-CM | POA: Diagnosis not present

## 2019-11-08 DIAGNOSIS — N3946 Mixed incontinence: Secondary | ICD-10-CM | POA: Diagnosis not present

## 2019-11-08 DIAGNOSIS — I10 Essential (primary) hypertension: Secondary | ICD-10-CM | POA: Diagnosis not present

## 2019-11-08 DIAGNOSIS — E7800 Pure hypercholesterolemia, unspecified: Secondary | ICD-10-CM | POA: Diagnosis not present

## 2019-11-08 DIAGNOSIS — M6281 Muscle weakness (generalized): Secondary | ICD-10-CM | POA: Diagnosis not present

## 2019-11-08 DIAGNOSIS — R41841 Cognitive communication deficit: Secondary | ICD-10-CM | POA: Diagnosis not present

## 2019-11-09 LAB — LIPID PANEL
Cholesterol: 156 (ref 0–200)
HDL: 47 (ref 35–70)
LDL Cholesterol: 92
LDl/HDL Ratio: 3.3
Triglycerides: 77 (ref 40–160)

## 2019-11-09 LAB — CBC AND DIFFERENTIAL
HCT: 41 (ref 41–53)
Hemoglobin: 13.9 (ref 13.5–17.5)
Neutrophils Absolute: 6320
Platelets: 226 (ref 150–399)
WBC: 10

## 2019-11-09 LAB — BASIC METABOLIC PANEL
BUN: 22 — AB (ref 4–21)
CO2: 25 — AB (ref 13–22)
Chloride: 105 (ref 99–108)
Creatinine: 1 (ref 0.6–1.3)
Glucose: 91
Potassium: 4.2 (ref 3.4–5.3)
Sodium: 138 (ref 137–147)

## 2019-11-09 LAB — COMPREHENSIVE METABOLIC PANEL
Albumin: 3.8 (ref 3.5–5.0)
Calcium: 9.5 (ref 8.7–10.7)
Globulin: 2.2

## 2019-11-09 LAB — TSH: TSH: 2.84 (ref 0.41–5.90)

## 2019-11-09 LAB — CBC: RBC: 4.41 (ref 3.87–5.11)

## 2019-11-09 LAB — HEPATIC FUNCTION PANEL
ALT: 15 (ref 10–40)
AST: 17 (ref 14–40)
Alkaline Phosphatase: 81 (ref 25–125)
Bilirubin, Total: 0.5

## 2019-11-10 DIAGNOSIS — M6281 Muscle weakness (generalized): Secondary | ICD-10-CM | POA: Diagnosis not present

## 2019-11-10 DIAGNOSIS — M5430 Sciatica, unspecified side: Secondary | ICD-10-CM | POA: Diagnosis not present

## 2019-11-10 DIAGNOSIS — R1312 Dysphagia, oropharyngeal phase: Secondary | ICD-10-CM | POA: Diagnosis not present

## 2019-11-10 DIAGNOSIS — R2681 Unsteadiness on feet: Secondary | ICD-10-CM | POA: Diagnosis not present

## 2019-11-10 DIAGNOSIS — R41841 Cognitive communication deficit: Secondary | ICD-10-CM | POA: Diagnosis not present

## 2019-11-10 DIAGNOSIS — N3946 Mixed incontinence: Secondary | ICD-10-CM | POA: Diagnosis not present

## 2019-11-11 DIAGNOSIS — M5430 Sciatica, unspecified side: Secondary | ICD-10-CM | POA: Diagnosis not present

## 2019-11-11 DIAGNOSIS — M6281 Muscle weakness (generalized): Secondary | ICD-10-CM | POA: Diagnosis not present

## 2019-11-11 DIAGNOSIS — N3946 Mixed incontinence: Secondary | ICD-10-CM | POA: Diagnosis not present

## 2019-11-11 DIAGNOSIS — R1312 Dysphagia, oropharyngeal phase: Secondary | ICD-10-CM | POA: Diagnosis not present

## 2019-11-11 DIAGNOSIS — R41841 Cognitive communication deficit: Secondary | ICD-10-CM | POA: Diagnosis not present

## 2019-11-11 DIAGNOSIS — R2681 Unsteadiness on feet: Secondary | ICD-10-CM | POA: Diagnosis not present

## 2019-11-14 DIAGNOSIS — R41841 Cognitive communication deficit: Secondary | ICD-10-CM | POA: Diagnosis not present

## 2019-11-14 DIAGNOSIS — N3946 Mixed incontinence: Secondary | ICD-10-CM | POA: Diagnosis not present

## 2019-11-14 DIAGNOSIS — M5430 Sciatica, unspecified side: Secondary | ICD-10-CM | POA: Diagnosis not present

## 2019-11-14 DIAGNOSIS — R2681 Unsteadiness on feet: Secondary | ICD-10-CM | POA: Diagnosis not present

## 2019-11-14 DIAGNOSIS — R1312 Dysphagia, oropharyngeal phase: Secondary | ICD-10-CM | POA: Diagnosis not present

## 2019-11-14 DIAGNOSIS — M6281 Muscle weakness (generalized): Secondary | ICD-10-CM | POA: Diagnosis not present

## 2019-11-15 DIAGNOSIS — M5430 Sciatica, unspecified side: Secondary | ICD-10-CM | POA: Diagnosis not present

## 2019-11-15 DIAGNOSIS — R41841 Cognitive communication deficit: Secondary | ICD-10-CM | POA: Diagnosis not present

## 2019-11-15 DIAGNOSIS — M6281 Muscle weakness (generalized): Secondary | ICD-10-CM | POA: Diagnosis not present

## 2019-11-15 DIAGNOSIS — R2681 Unsteadiness on feet: Secondary | ICD-10-CM | POA: Diagnosis not present

## 2019-11-15 DIAGNOSIS — Z20828 Contact with and (suspected) exposure to other viral communicable diseases: Secondary | ICD-10-CM | POA: Diagnosis not present

## 2019-11-15 DIAGNOSIS — N3946 Mixed incontinence: Secondary | ICD-10-CM | POA: Diagnosis not present

## 2019-11-15 DIAGNOSIS — R1312 Dysphagia, oropharyngeal phase: Secondary | ICD-10-CM | POA: Diagnosis not present

## 2019-11-16 DIAGNOSIS — R41841 Cognitive communication deficit: Secondary | ICD-10-CM | POA: Diagnosis not present

## 2019-11-16 DIAGNOSIS — M6281 Muscle weakness (generalized): Secondary | ICD-10-CM | POA: Diagnosis not present

## 2019-11-16 DIAGNOSIS — R2681 Unsteadiness on feet: Secondary | ICD-10-CM | POA: Diagnosis not present

## 2019-11-16 DIAGNOSIS — M5430 Sciatica, unspecified side: Secondary | ICD-10-CM | POA: Diagnosis not present

## 2019-11-16 DIAGNOSIS — N3946 Mixed incontinence: Secondary | ICD-10-CM | POA: Diagnosis not present

## 2019-11-16 DIAGNOSIS — R1312 Dysphagia, oropharyngeal phase: Secondary | ICD-10-CM | POA: Diagnosis not present

## 2019-11-22 DIAGNOSIS — M6281 Muscle weakness (generalized): Secondary | ICD-10-CM | POA: Diagnosis not present

## 2019-11-22 DIAGNOSIS — R2681 Unsteadiness on feet: Secondary | ICD-10-CM | POA: Diagnosis not present

## 2019-11-22 DIAGNOSIS — N3946 Mixed incontinence: Secondary | ICD-10-CM | POA: Diagnosis not present

## 2019-11-22 DIAGNOSIS — R41841 Cognitive communication deficit: Secondary | ICD-10-CM | POA: Diagnosis not present

## 2019-11-22 DIAGNOSIS — R1312 Dysphagia, oropharyngeal phase: Secondary | ICD-10-CM | POA: Diagnosis not present

## 2019-11-22 DIAGNOSIS — M5430 Sciatica, unspecified side: Secondary | ICD-10-CM | POA: Diagnosis not present

## 2019-11-23 DIAGNOSIS — R1312 Dysphagia, oropharyngeal phase: Secondary | ICD-10-CM | POA: Diagnosis not present

## 2019-11-23 DIAGNOSIS — N3946 Mixed incontinence: Secondary | ICD-10-CM | POA: Diagnosis not present

## 2019-11-23 DIAGNOSIS — M6281 Muscle weakness (generalized): Secondary | ICD-10-CM | POA: Diagnosis not present

## 2019-11-23 DIAGNOSIS — R41841 Cognitive communication deficit: Secondary | ICD-10-CM | POA: Diagnosis not present

## 2019-11-23 DIAGNOSIS — M5430 Sciatica, unspecified side: Secondary | ICD-10-CM | POA: Diagnosis not present

## 2019-11-23 DIAGNOSIS — R2681 Unsteadiness on feet: Secondary | ICD-10-CM | POA: Diagnosis not present

## 2019-11-24 ENCOUNTER — Encounter: Payer: Self-pay | Admitting: Nurse Practitioner

## 2019-11-24 ENCOUNTER — Encounter: Payer: Medicare Other | Admitting: Nurse Practitioner

## 2019-11-24 ENCOUNTER — Other Ambulatory Visit: Payer: Self-pay

## 2019-11-24 NOTE — Progress Notes (Signed)
Location:    [facility] Room Number: [place] of Service:  ALF [phone]) Provider:  Mast, Man NP  Lawerance Cruel, MD  Patient Care Team: Lawerance Cruel, MD as PCP - General (Family Medicine) Vicie Mutters, MD as Consulting Physician (Otolaryngology)  Extended Emergency Contact Information Primary Emergency Contact: Moultry,[name] Address: [address]          [place], [place] [address] Johnnette Litter of [place] Phone: [phone] Relation: Spouse Secondary Emergency Contact: muscab, goodlow Mobile Phone: [phone] Relation: Son  Code Status:  Full Code Goals of care: Advanced Directive information Advanced Directives 11/23/2019  Does Patient Have a Medical Advance Directive? Yes  Type of Paramedic of [place];Living will  Does patient want to make changes to medical advance directive? No - Patient declined  Copy of [place] in Chart? Yes - validated most recent copy scanned in chart (See row information)  Would patient like information on creating a medical advance directive? -     Chief Complaint  Patient presents with  . Medical Management of Chronic Issues  . Health Maintenance    TDAP, PCV13    HPI:  Pt is a 85 y.o. male seen today for medical management of chronic diseases.     Past Medical History:  Diagnosis Date  . Alzheimer's dementia ([place])   . Angina pectoris ([place])   . Carotid stenosis    a. Carotid US 9/16:  Bilateral ICA 1-39%  . Coronary artery disease    a. s/p PCI in 2005 in [place] for MI; b. NSTEMI 9/16 >> LHC with 2 v CAD >> s/p CABG   . Dementia without behavioral disturbance ([place])   . Diverticulitis   . Hearing loss   . History of MI (myocardial infarction) 2005   treated in [place], [place]  . History of transesophageal echocardiography (TEE) for monitoring    a. Intra-Op TEE 9/16:  Mild LVH, EF 55-60%, normal wall motion, mild MR, no PFO  . HLD (hyperlipidemia)    . Hypertension   . Lyme disease   . MI (myocardial infarction) ([place])   . Sciatica   . Sensorineural hearing loss (SNHL), bilateral    severe   Past Surgical History:  Procedure Laterality Date  . APPENDECTOMY    . CARDIAC CATHETERIZATION N/A 08/03/2015   Procedure: Left Heart Cath and Coronary Angiography;  Surgeon: Peter M Martinique, MD;  Location: [place] CV LAB;  Service: Cardiovascular;  Laterality: N/A;  . CARDIAC CATHETERIZATION  08/03/2015   Procedure: Intravascular Pressure Wire/FFR Study;  Surgeon: Peter M Martinique, MD;  Location: [place] CV LAB;  Service: Cardiovascular;;  . CHOLECYSTECTOMY    . COCHLEAR IMPLANT Left 09/23/2017   Procedure: COCHLEAR IMPLANT;  Surgeon: Vicie Mutters, MD;  Location: [facility];  Service: ENT;  Laterality: Left;  . CORONARY ARTERY BYPASS GRAFT N/A 08/07/2015   Procedure: CORONARY ARTERY BYPASS GRAFTING (CABG) x 3 with Endoscopic Vein Harvesting of greater saphenous vein from bilateral thighs;  Surgeon: [name], MD;  Location: [facility];  Service: Open Heart Surgery;  Laterality: N/A;  . CORONARY STENT PLACEMENT    . HERNIA REPAIR    . LEFT HEART CATH AND CORS/GRAFTS ANGIOGRAPHY N/A 04/02/2017   Procedure: Left Heart Cath and Cors/Grafts Angiography;  Surgeon: Jettie Booze, MD;  Location: [facility];  Service: Cardiovascular;  Laterality: N/A;  . TEE WITHOUT CARDIOVERSION N/A 08/07/2015   Procedure: TRANSESOPHAGEAL ECHOCARDIOGRAM (  TEE);  Surgeon: [name], MD;  Location: [facility];  Service: Open Heart Surgery;  Laterality: N/A;  . TONSILLECTOMY      No Known Allergies  Allergies as of 11/24/2019   No Known Allergies     Medication List       Accurate as of November 24, 2019  9:38 AM. If you have any questions, ask your nurse or doctor.        aspirin 81 MG chewable tablet Chew 81 mg by mouth daily.   B-12 PO Take 2,500 mg by mouth daily.   clopidogrel 75 MG tablet Commonly known as: PLAVIX Take 75 mg by mouth  daily.   donepezil 10 MG tablet Commonly known as: ARICEPT Take 10 mg by mouth at bedtime.   FISH OIL PO Take 1,400 mg by mouth daily.   GLUCOSAMINE-CHONDROITIN PO Take 1 tablet by mouth daily. 1500mg    levothyroxine 50 MCG tablet Commonly known as: SYNTHROID Take 50 mcg by mouth daily before breakfast.   lisinopril 5 MG tablet Commonly known as: ZESTRIL Take 1 tablet (5 mg total) by mouth daily.   Magnesium 250 MG Tabs Take 1 tablet by mouth daily.   memantine 5 MG tablet Commonly known as: NAMENDA Take 5 mg by mouth daily.   metoprolol tartrate 25 MG tablet Commonly known as: LOPRESSOR Take 25 mg by mouth daily.   metoprolol tartrate 25 MG tablet Commonly known as: LOPRESSOR Take 12.5 mg by mouth every morning.   MULTIPLE VITAMINS-IRON PO Take 2,500 mg by mouth daily.   nitroGLYCERIN 0.4 MG SL tablet Commonly known as: NITROSTAT Place 0.4 mg under the tongue. Dissolve one tablet under the tongue every 5 minutes as needed for chest pain. Maximum of three tablets n 15 minutes. Call 911 if pain persists. As needed   pravastatin 40 MG tablet Commonly known as: PRAVACHOL Take 1 tablet (40 mg total) by mouth every evening.   sertraline 25 MG tablet Commonly known as: ZOLOFT Take 25 mg by mouth daily.   VITAMIN C PO Take 500 mg by mouth daily.       Review of Systems  Immunization History  Administered Date(s) Administered  . Influenza, High Dose Seasonal PF 08/16/2019  . Influenza-Unspecified 08/30/2015   Pertinent  Health Maintenance Due  Topic Date Due  . PNA vac Low Risk Adult (1 of 2 - PCV13) 05/27/1993  . INFLUENZA VACCINE  Completed   Fall Risk  08/20/2015  Falls in the past year? No   Functional Status Survey:    Vitals:   11/24/19 0932  BP: 122/68  Pulse: 72  Resp: 20  Temp: (!) 97.1 F (36.2 C)  SpO2: 93%  Weight: 156 lb (70.8 kg)  Height: 5\' 9"  (1.753 m)   Body mass index is 23.04 kg/m. Physical Exam  Labs reviewed: Recent  Labs    11/09/19 0000  NA 138  K 4.2  CL 105  CO2 25*  BUN 22*  CREATININE 1.0  CALCIUM 9.5   Recent Labs    11/09/19 0000  AST 17  ALT 15  ALKPHOS 81  ALBUMIN 3.8   Recent Labs    11/09/19 0000  WBC 10.0  NEUTROABS 6,320  HGB 13.9  HCT 41  PLT 226   Lab Results  Component Value Date   TSH 2.84 11/09/2019   Lab Results  Component Value Date   HGBA1C 5.7 (H) 08/06/2015   Lab Results  Component Value Date   CHOL 156 11/09/2019  HDL 47 11/09/2019   LDLCALC 92 11/09/2019   TRIG 77 11/09/2019   CHOLHDL 3.2 08/02/2015    Significant Diagnostic Results in last 30 days:  No results found.  Assessment/Plan There are no diagnoses linked to this encounter.   Family/ staff Communication:   Labs/tests ordered:

## 2019-11-25 NOTE — Progress Notes (Signed)
This encounter was created in error - please disregard.

## 2019-11-27 DIAGNOSIS — Z48812 Encounter for surgical aftercare following surgery on the circulatory system: Secondary | ICD-10-CM | POA: Diagnosis not present

## 2019-11-27 DIAGNOSIS — I25709 Atherosclerosis of coronary artery bypass graft(s), unspecified, with unspecified angina pectoris: Secondary | ICD-10-CM | POA: Diagnosis not present

## 2019-11-27 DIAGNOSIS — R2681 Unsteadiness on feet: Secondary | ICD-10-CM | POA: Diagnosis not present

## 2019-11-27 DIAGNOSIS — I10 Essential (primary) hypertension: Secondary | ICD-10-CM | POA: Diagnosis not present

## 2019-11-27 DIAGNOSIS — R41841 Cognitive communication deficit: Secondary | ICD-10-CM | POA: Diagnosis not present

## 2019-11-27 DIAGNOSIS — H9190 Unspecified hearing loss, unspecified ear: Secondary | ICD-10-CM | POA: Diagnosis not present

## 2019-11-27 DIAGNOSIS — Z951 Presence of aortocoronary bypass graft: Secondary | ICD-10-CM | POA: Diagnosis not present

## 2019-11-27 DIAGNOSIS — N3941 Urge incontinence: Secondary | ICD-10-CM | POA: Diagnosis not present

## 2019-11-27 DIAGNOSIS — G309 Alzheimer's disease, unspecified: Secondary | ICD-10-CM | POA: Diagnosis not present

## 2019-11-27 DIAGNOSIS — R1312 Dysphagia, oropharyngeal phase: Secondary | ICD-10-CM | POA: Diagnosis not present

## 2019-11-27 DIAGNOSIS — M6281 Muscle weakness (generalized): Secondary | ICD-10-CM | POA: Diagnosis not present

## 2019-11-27 DIAGNOSIS — R29898 Other symptoms and signs involving the musculoskeletal system: Secondary | ICD-10-CM | POA: Diagnosis not present

## 2019-11-27 DIAGNOSIS — M5430 Sciatica, unspecified side: Secondary | ICD-10-CM | POA: Diagnosis not present

## 2019-11-27 DIAGNOSIS — I2510 Atherosclerotic heart disease of native coronary artery without angina pectoris: Secondary | ICD-10-CM | POA: Diagnosis not present

## 2019-11-28 DIAGNOSIS — I25709 Atherosclerosis of coronary artery bypass graft(s), unspecified, with unspecified angina pectoris: Secondary | ICD-10-CM | POA: Diagnosis not present

## 2019-11-28 DIAGNOSIS — I2510 Atherosclerotic heart disease of native coronary artery without angina pectoris: Secondary | ICD-10-CM | POA: Diagnosis not present

## 2019-11-28 DIAGNOSIS — N3941 Urge incontinence: Secondary | ICD-10-CM | POA: Diagnosis not present

## 2019-11-28 DIAGNOSIS — M6281 Muscle weakness (generalized): Secondary | ICD-10-CM | POA: Diagnosis not present

## 2019-11-28 DIAGNOSIS — I10 Essential (primary) hypertension: Secondary | ICD-10-CM | POA: Diagnosis not present

## 2019-11-28 DIAGNOSIS — M5430 Sciatica, unspecified side: Secondary | ICD-10-CM | POA: Diagnosis not present

## 2019-12-07 DIAGNOSIS — Z20828 Contact with and (suspected) exposure to other viral communicable diseases: Secondary | ICD-10-CM | POA: Diagnosis not present

## 2019-12-08 ENCOUNTER — Non-Acute Institutional Stay: Payer: Medicare Other | Admitting: Internal Medicine

## 2019-12-08 ENCOUNTER — Encounter: Payer: Self-pay | Admitting: Internal Medicine

## 2019-12-08 DIAGNOSIS — E7800 Pure hypercholesterolemia, unspecified: Secondary | ICD-10-CM | POA: Diagnosis not present

## 2019-12-08 DIAGNOSIS — E538 Deficiency of other specified B group vitamins: Secondary | ICD-10-CM

## 2019-12-08 DIAGNOSIS — I2583 Coronary atherosclerosis due to lipid rich plaque: Secondary | ICD-10-CM

## 2019-12-08 DIAGNOSIS — I2510 Atherosclerotic heart disease of native coronary artery without angina pectoris: Secondary | ICD-10-CM | POA: Diagnosis not present

## 2019-12-08 DIAGNOSIS — I10 Essential (primary) hypertension: Secondary | ICD-10-CM

## 2019-12-08 DIAGNOSIS — E039 Hypothyroidism, unspecified: Secondary | ICD-10-CM | POA: Diagnosis not present
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
## 2019-12-14 DIAGNOSIS — Z20828 Contact with and (suspected) exposure to other viral communicable diseases: Secondary | ICD-10-CM | POA: Diagnosis not present

## 2019-12-21 DIAGNOSIS — Z20828 Contact with and (suspected) exposure to other viral communicable diseases: Secondary | ICD-10-CM | POA: Diagnosis not present

## 2019-12-24 DIAGNOSIS — Z23 Encounter for immunization: Secondary | ICD-10-CM | POA: Diagnosis not present

## 2019-12-28 DIAGNOSIS — Z20828 Contact with and (suspected) exposure to other viral communicable diseases: Secondary | ICD-10-CM | POA: Diagnosis not present

## 2020-01-04 DIAGNOSIS — Z20828 Contact with and (suspected) exposure to other viral communicable diseases: Secondary | ICD-10-CM | POA: Diagnosis not present

## 2020-01-11 DIAGNOSIS — Z20828 Contact with and (suspected) exposure to other viral communicable diseases: Secondary | ICD-10-CM | POA: Diagnosis not present

## 2020-02-09 DIAGNOSIS — D510 Vitamin B12 deficiency anemia due to intrinsic factor deficiency: Secondary | ICD-10-CM | POA: Diagnosis not present

## 2020-02-09 DIAGNOSIS — I10 Essential (primary) hypertension: Secondary | ICD-10-CM | POA: Diagnosis not present

## 2020-02-09 DIAGNOSIS — E612 Magnesium deficiency: Secondary | ICD-10-CM | POA: Diagnosis not present

## 2020-02-10 ENCOUNTER — Other Ambulatory Visit: Payer: Self-pay

## 2020-02-10 ENCOUNTER — Non-Acute Institutional Stay: Payer: Medicare Other | Admitting: Internal Medicine

## 2020-02-10 ENCOUNTER — Encounter: Payer: Self-pay | Admitting: Internal Medicine

## 2020-02-10 VITALS — BP 142/68 | HR 55 | Temp 97.1°F | Ht 69.0 in | Wt 182.8 lb

## 2020-02-10 DIAGNOSIS — I2510 Atherosclerotic heart disease of native coronary artery without angina pectoris: Secondary | ICD-10-CM | POA: Diagnosis not present

## 2020-02-10 DIAGNOSIS — E538 Deficiency of other specified B group vitamins: Secondary | ICD-10-CM | POA: Diagnosis not present

## 2020-02-10 DIAGNOSIS — F339 Major depressive disorder, recurrent, unspecified: Secondary | ICD-10-CM | POA: Diagnosis not present

## 2020-02-10 DIAGNOSIS — I10 Essential (primary) hypertension: Secondary | ICD-10-CM

## 2020-02-10 DIAGNOSIS — F0390 Unspecified dementia without behavioral disturbance: Secondary | ICD-10-CM | POA: Diagnosis not present

## 2020-02-10 DIAGNOSIS — E7800 Pure hypercholesterolemia, unspecified: Secondary | ICD-10-CM | POA: Diagnosis not present

## 2020-02-10 DIAGNOSIS — I2583 Coronary atherosclerosis due to lipid rich plaque: Secondary | ICD-10-CM

## 2020-02-10 DIAGNOSIS — E039 Hypothyroidism, unspecified: Secondary | ICD-10-CM

## 2020-02-10 DIAGNOSIS — F03A0 Unspecified dementia, mild, without behavioral disturbance, psychotic disturbance, mood disturbance, and anxiety: Secondary | ICD-10-CM

## 2020-02-10 LAB — VITAMIN B12: Vitamin B-12: 1187
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
## 2020-02-16 DIAGNOSIS — Z45321 Encounter for adjustment and management of cochlear device: Secondary | ICD-10-CM | POA: Diagnosis not present

## 2020-02-16 DIAGNOSIS — H903 Sensorineural hearing loss, bilateral: Secondary | ICD-10-CM | POA: Diagnosis not present

## 2020-02-16 DIAGNOSIS — Z9621 Cochlear implant status: Secondary | ICD-10-CM | POA: Diagnosis not present

## 2020-03-13 DIAGNOSIS — R7611 Nonspecific reaction to tuberculin skin test without active tuberculosis: Secondary | ICD-10-CM | POA: Diagnosis not present

## 2020-03-15 ENCOUNTER — Telehealth: Payer: Self-pay | Admitting: *Deleted

## 2020-03-15 NOTE — Telephone Encounter (Signed)
Patient son, Isaac [name] called and stated that patient is moving from [facility] to [facility] in [place] next Wednesday. Son stated that [facility] faxed over Admission Paperwork last week to Dr. Lyndel Safe at [facility]. Wanted to know if this was done. Trying to have patient moved by next Wednesday. Please Advise.    Codyjames Salami, Son [phone]

## 2020-03-16 NOTE — Telephone Encounter (Signed)
Son Notified. LMOM with Dr. Steve Rattler response.

## 2020-03-16 NOTE — Telephone Encounter (Signed)
I have the Paper work. Will be able to fax them on Tue when I am there in Facility. Let him know. Thanks

## 2020-03-20 ENCOUNTER — Encounter: Payer: Self-pay | Admitting: Internal Medicine

## 2020-03-20 ENCOUNTER — Non-Acute Institutional Stay: Payer: Medicare Other | Admitting: Internal Medicine

## 2020-03-20 DIAGNOSIS — E538 Deficiency of other specified B group vitamins: Secondary | ICD-10-CM | POA: Diagnosis not present

## 2020-03-20 DIAGNOSIS — I2583 Coronary atherosclerosis due to lipid rich plaque: Secondary | ICD-10-CM

## 2020-03-20 DIAGNOSIS — E7800 Pure hypercholesterolemia, unspecified: Secondary | ICD-10-CM | POA: Diagnosis not present

## 2020-03-20 DIAGNOSIS — E039 Hypothyroidism, unspecified: Secondary | ICD-10-CM

## 2020-03-20 DIAGNOSIS — F339 Major depressive disorder, recurrent, unspecified: Secondary | ICD-10-CM

## 2020-03-20 DIAGNOSIS — F03A0 Unspecified dementia, mild, without behavioral disturbance, psychotic disturbance, mood disturbance, and anxiety: Secondary | ICD-10-CM

## 2020-03-20 DIAGNOSIS — I10 Essential (primary) hypertension: Secondary | ICD-10-CM

## 2020-03-20 DIAGNOSIS — F0390 Unspecified dementia without behavioral disturbance: Secondary | ICD-10-CM | POA: Diagnosis not present

## 2020-03-20 DIAGNOSIS — I2510 Atherosclerotic heart disease of native coronary artery without angina pectoris: Secondary | ICD-10-CM | POA: Diagnosis not present

## 2020-03-20 LAB — MAGNESIUM: Magnesium: 2.1

## 2020-03-20 NOTE — Progress Notes (Signed)
Location:   [facility] Room Number: [place] of Service:  ALF [phone]) Provider:  Veleta Miners MD   Virgie Dad, MD  Patient Care Team: Virgie Dad, MD as PCP - General (Internal Medicine) Vicie Mutters, MD as Consulting Physician (Otolaryngology)  Extended Emergency Contact Information Primary Emergency Contact: Gaspard,[name] Address: [address]          [place], [place] [address] Johnnette Litter of [place] Phone: [phone] Relation: Spouse Secondary Emergency Contact: tano, marcum Mobile Phone: [phone] Relation: Son  Code Status:  Full Code Goals of care: Advanced Directive information Advanced Directives 02/10/2020  Does Patient Have a Medical Advance Directive? Yes  Type of Advance Directive [place]  Does patient want to make changes to medical advance directive? No - Patient declined  Copy of [place] in Chart? Yes - validated most recent copy scanned in chart (See row information)  Would patient like information on creating a medical advance directive? -     Chief Complaint  Patient presents with  . Discharge Note    Discharge    HPI:  Pt is a 86 y.o. male seen today for an acute visit for Discharge from the Facility  Patient hasHas history ofhypertension, hypothyroid,hyperlipidemia CAD S/P CABG on Plavix and Aspirin, Hearing Loss s/p Cochlear Implant AndAlzheimer's dementiawas seen by Dr Jaynee Eagles. MRI showed General Atrophy  Patient has been in AL for past few months. He was moved to AL due to Behavior issues He has done well here. No Nursing issues. Walks with the walker. Knows that he has Memory issues. No problem with any Behaviors. He will be transferred to [facility] facility in [place] to be close to his Son Seen today. Had no Acute problems    Past Medical History:  Diagnosis Date  . Alzheimer's dementia ([place])   . Angina pectoris ([place])   . Carotid stenosis    a.  Carotid US 9/16:  Bilateral ICA 1-39%  . Coronary artery disease    a. s/p PCI in 2005 in [place] for MI; b. NSTEMI 9/16 >> LHC with 2 v CAD >> s/p CABG   . Dementia without behavioral disturbance ([place])   . Diverticulitis   . Hearing loss   . History of MI (myocardial infarction) 2005   [place] in [place]  . History of transesophageal echocardiography (TEE) for monitoring    a. Intra-Op TEE 9/16:  Mild LVH, EF 55-60%, normal wall motion, mild MR, no PFO  . HLD (hyperlipidemia)   . Hypertension   . Lyme disease   . MI (myocardial infarction) ([place])   . Sciatica   . Sensorineural hearing loss (SNHL), bilateral    severe   Past Surgical History:  Procedure Laterality Date  . APPENDECTOMY    . CARDIAC CATHETERIZATION N/A 08/03/2015   Procedure: Left Heart Cath and Coronary Angiography;  Surgeon: Peter M Martinique, MD;  Location: [facility];  Service: Cardiovascular;  Laterality: N/A;  . CARDIAC CATHETERIZATION  08/03/2015   Procedure: Intravascular Pressure Wire/FFR Study;  Surgeon: Peter M Martinique, MD;  Location: [facility];  Service: Cardiovascular;;  . CHOLECYSTECTOMY    . COCHLEAR IMPLANT Left 09/23/2017   Procedure: COCHLEAR IMPLANT;  Surgeon: Vicie Mutters, MD;  Location: [facility];  Service: ENT;  Laterality: Left;  . CORONARY ARTERY BYPASS GRAFT N/A 08/07/2015   Procedure: CORONARY ARTERY BYPASS GRAFTING (CABG) x 3 with Endoscopic Vein Harvesting of greater saphenous vein from  bilateral thighs;  Surgeon: [name], MD;  Location: [facility];  Service: Open Heart Surgery;  Laterality: N/A;  . CORONARY STENT PLACEMENT    . HERNIA REPAIR    . LEFT HEART CATH AND CORS/GRAFTS ANGIOGRAPHY N/A 04/02/2017   Procedure: Left Heart Cath and Cors/Grafts Angiography;  Surgeon: Jettie Booze, MD;  Location: [facility];  Service: Cardiovascular;  Laterality: N/A;  . TEE WITHOUT CARDIOVERSION N/A 08/07/2015   Procedure: TRANSESOPHAGEAL ECHOCARDIOGRAM (TEE);  Surgeon: [name], MD;  Location: [facility];  Service: Open Heart Surgery;  Laterality: N/A;  . TONSILLECTOMY      No Known Allergies  Allergies as of 03/20/2020   No Known Allergies     Medication List       Accurate as of March 20, 2020 10:33 AM. If you have any questions, ask your nurse or doctor.        STOP taking these medications   aspirin 81 MG chewable tablet Stopped by: Virgie Dad, MD     TAKE these medications   ascorbic acid 500 MG tablet Commonly known as: VITAMIN C Take 500 mg by mouth daily.   0000000 + Folic Acid 123456 MCG Tbdp Take 1 tablet by mouth daily.   clopidogrel 75 MG tablet Commonly known as: PLAVIX Take 75 mg by mouth daily.   donepezil 10 MG tablet Commonly known as: ARICEPT Take 10 mg by mouth at bedtime.   FISH OIL PO Take 1,400 mg by mouth daily.   Glucosamine-Chondroitin 1500-1200 MG/30ML Liqd Take 1 tablet by mouth.   levothyroxine 50 MCG tablet Commonly known as: SYNTHROID Take 50 mcg by mouth daily before breakfast.   lisinopril 5 MG tablet Commonly known as: ZESTRIL Take 1 tablet (5 mg total) by mouth daily.   Magnesium 250 MG Tabs Take 1 tablet by mouth daily.   memantine 5 MG tablet Commonly known as: NAMENDA Take 5 mg by mouth 2 (two) times daily.   metoprolol tartrate 25 MG tablet Commonly known as: LOPRESSOR Take 25 mg by mouth at bedtime.   metoprolol tartrate 25 MG tablet Commonly known as: LOPRESSOR Take 12.5 mg by mouth every morning.   MULTIPLE VITAMINS-IRON PO Take 2,500 mg by mouth daily.   nitroGLYCERIN 0.4 MG SL tablet Commonly known as: NITROSTAT Place 0.4 mg under the tongue. Dissolve one tablet under the tongue every 5 minutes as needed for chest pain. Maximum of three tablets n 15 minutes. Call 911 if pain persists. As needed   pravastatin 40 MG tablet Commonly known as: PRAVACHOL Take 1 tablet (40 mg total) by mouth every evening.   sertraline 50 MG tablet Commonly known as: ZOLOFT Take 50  mg by mouth daily.       Review of Systems  Immunization History  Administered Date(s) Administered  . Influenza, High Dose Seasonal PF 08/16/2019  . Influenza-Unspecified 08/30/2015  . Moderna SARS-COVID-2 Vaccination 11/28/2019, 12/26/2019  . Pneumococcal Conjugate-13 08/30/2014  . Pneumococcal-Unspecified 03/10/2018   Pertinent  Health Maintenance Due  Topic Date Due  . INFLUENZA VACCINE  06/24/2020  . PNA vac Low Risk Adult  Completed   Fall Risk  02/10/2020 08/20/2015  Falls in the past year? 0 No  Number falls in past yr: 0 -   Functional Status Survey:    Vitals:   03/20/20 1020  BP: 132/78  Pulse: 68  Resp: 18  Temp: 97.9 F (36.6 C)  SpO2: 96%  Weight: 161 lb 12.8 oz (73.4  kg)  Height: 5\' 8"  (1.727 m)   Body mass index is 24.6 kg/m. Physical Exam Constitutional: Oriented to person, place, and time. Well-developed and well-nourished.  HENT:  Head: Normocephalic.  Mouth/Throat: Oropharynx is clear and moist.  Eyes: Pupils are equal, round, and reactive to light.  Neck: Neck supple.  Cardiovascular: Normal rate and normal heart sounds.  No murmur heard. Pulmonary/Chest: Effort normal and breath sounds normal. No respiratory distress. No wheezes. She has no rales.  Abdominal: Soft. Bowel sounds are normal. No distension. There is no tenderness. There is no rebound.  Musculoskeletal: No edema.  Lymphadenopathy: none Neurological: Alert and oriented to person, place, and time.  Walks with walker Skin: Skin is warm and dry.  Psychiatric: Normal mood and affect. Behavior is normal. Thought content normal.   Labs reviewed: Recent Labs    11/09/19 0000 02/10/20 0000  NA 138  --   K 4.2  --   CL 105  --   CO2 25*  --   BUN 22*  --   CREATININE 1.0  --   CALCIUM 9.5  --   MG  --  2.1   Recent Labs    11/09/19 0000  AST 17  ALT 15  ALKPHOS 81  ALBUMIN 3.8   Recent Labs    11/09/19 0000  WBC 10.0  NEUTROABS 6,320  HGB 13.9  HCT 41  PLT  226   Lab Results  Component Value Date   TSH 2.84 11/09/2019   Lab Results  Component Value Date   HGBA1C 5.7 (H) 08/06/2015   Lab Results  Component Value Date   CHOL 156 11/09/2019   HDL 47 11/09/2019   LDLCALC 92 11/09/2019   TRIG 77 11/09/2019   CHOLHDL 3.2 08/02/2015    Significant Diagnostic Results in last 30 days:  No results found.  Assessment/Plan Coronary artery disease due to lipid rich plaque On Plavix, Lopressor and Statin  Hypothyroidism,  TSH Checked in 12/20 was Normal Pure hypercholesterolemia LD L92 on statin Essential hypertension On Lisinopril and Metoprolol Vitamin B12 deficiency Levels were noirmal Depression, recurrent (HCC) On Zoloft 50 mg Mild dementia (HCC) MMSE 24/30 Failed Clock Drawing and Recall Continue Aricept and Namenda Dose of Namenda can be increased eventually  Discharge to Another AL facility in [place] near his son.  Family/ staff Communication:   Labs/tests ordered:   Discharge Planning more then 30 min

## 2020-03-20 NOTE — Telephone Encounter (Signed)
Patient son, Durward Mallard requesting paperwork to be faxed to the [employer]. Stated that he is trying to get patient moved in and they are requiring the paperwork.

## 2020-03-20 NOTE — Telephone Encounter (Signed)
Paper work is with facility Nurse and they will fax it

## 2020-03-22 NOTE — Addendum Note (Signed)
Addended by: Georgina Snell on: 03/22/2020 12:29 PM   Modules accepted: Level of Service

## 2020-04-10 DIAGNOSIS — F0390 Unspecified dementia without behavioral disturbance: Secondary | ICD-10-CM | POA: Diagnosis not present

## 2020-04-10 DIAGNOSIS — E7801 Familial hypercholesterolemia: Secondary | ICD-10-CM | POA: Diagnosis not present

## 2020-04-11 DIAGNOSIS — E039 Hypothyroidism, unspecified: Secondary | ICD-10-CM | POA: Diagnosis not present

## 2020-04-11 DIAGNOSIS — I10 Essential (primary) hypertension: Secondary | ICD-10-CM | POA: Diagnosis not present

## 2020-04-11 DIAGNOSIS — F0390 Unspecified dementia without behavioral disturbance: Secondary | ICD-10-CM | POA: Diagnosis not present

## 2020-04-11 DIAGNOSIS — Z9189 Other specified personal risk factors, not elsewhere classified: Secondary | ICD-10-CM | POA: Diagnosis not present

## 2020-05-04 ENCOUNTER — Encounter: Payer: Self-pay | Admitting: Cardiology

## 2020-05-07 DIAGNOSIS — R0989 Other specified symptoms and signs involving the circulatory and respiratory systems: Secondary | ICD-10-CM | POA: Diagnosis not present

## 2020-06-27 DIAGNOSIS — L678 Other hair color and hair shaft abnormalities: Secondary | ICD-10-CM | POA: Diagnosis not present

## 2020-06-27 DIAGNOSIS — R21 Rash and other nonspecific skin eruption: Secondary | ICD-10-CM | POA: Diagnosis not present

## 2020-08-14 DIAGNOSIS — Z23 Encounter for immunization: Secondary | ICD-10-CM | POA: Diagnosis not present

## 2020-10-09 DIAGNOSIS — F0390 Unspecified dementia without behavioral disturbance: Secondary | ICD-10-CM | POA: Diagnosis not present

## 2020-10-09 DIAGNOSIS — I10 Essential (primary) hypertension: Secondary | ICD-10-CM | POA: Diagnosis not present

## 2020-10-29 DIAGNOSIS — Z23 Encounter for immunization: Secondary | ICD-10-CM | POA: Diagnosis not present

## 2020-11-14 DIAGNOSIS — Z79899 Other long term (current) drug therapy: Secondary | ICD-10-CM | POA: Diagnosis not present

## 2020-11-14 DIAGNOSIS — F428 Other obsessive-compulsive disorder: Secondary | ICD-10-CM | POA: Diagnosis not present

## 2020-11-14 DIAGNOSIS — R21 Rash and other nonspecific skin eruption: Secondary | ICD-10-CM | POA: Diagnosis not present
